# Patient Record
Sex: Female | Born: 1935 | Race: White | Hispanic: No | Marital: Single | Smoking: Never smoker
Health system: Southern US, Community
[De-identification: ages and names within clinical notes are randomized; demographics above are authoritative.]

## PROBLEM LIST (undated history)

## (undated) ENCOUNTER — Emergency Department (HOSPITAL_COMMUNITY): Discharge status: Absent without leave | Payer: Self-pay

## (undated) DIAGNOSIS — I10 Essential (primary) hypertension: Secondary | ICD-10-CM

## (undated) DIAGNOSIS — S0570XA Avulsion of unspecified eye, initial encounter: Secondary | ICD-10-CM

## (undated) DIAGNOSIS — I679 Cerebrovascular disease, unspecified: Secondary | ICD-10-CM

## (undated) DIAGNOSIS — Z9289 Personal history of other medical treatment: Secondary | ICD-10-CM

## (undated) DIAGNOSIS — R55 Syncope and collapse: Secondary | ICD-10-CM

## (undated) DIAGNOSIS — I440 Atrioventricular block, first degree: Secondary | ICD-10-CM

## (undated) DIAGNOSIS — M1990 Unspecified osteoarthritis, unspecified site: Secondary | ICD-10-CM

## (undated) DIAGNOSIS — K219 Gastro-esophageal reflux disease without esophagitis: Secondary | ICD-10-CM

## (undated) DIAGNOSIS — I4891 Unspecified atrial fibrillation: Secondary | ICD-10-CM

## (undated) DIAGNOSIS — E039 Hypothyroidism, unspecified: Secondary | ICD-10-CM

## (undated) DIAGNOSIS — R7303 Prediabetes: Secondary | ICD-10-CM

## (undated) DIAGNOSIS — J189 Pneumonia, unspecified organism: Secondary | ICD-10-CM

## (undated) HISTORY — DX: Hypothyroidism, unspecified: E03.9

## (undated) HISTORY — DX: Atrioventricular block, first degree: I44.0

## (undated) HISTORY — DX: Essential (primary) hypertension: I10

## (undated) HISTORY — DX: Personal history of other medical treatment: Z92.89

## (undated) HISTORY — PX: INGUINAL HERNIA REPAIR: SUR1180

## (undated) HISTORY — DX: Cerebrovascular disease, unspecified: I67.9

## (undated) HISTORY — PX: BACK SURGERY: SHX140

## (undated) HISTORY — DX: Unspecified atrial fibrillation: I48.91

## (undated) HISTORY — DX: Prediabetes: R73.03

## (undated) HISTORY — DX: Unspecified osteoarthritis, unspecified site: M19.90

## (undated) HISTORY — DX: Pneumonia, unspecified organism: J18.9

## (undated) HISTORY — DX: Syncope and collapse: R55

## (undated) HISTORY — DX: Avulsion of unspecified eye, initial encounter: S05.70XA

---

## 1997-10-17 ENCOUNTER — Other Ambulatory Visit: Admission: RE | Admit: 1997-10-17 | Discharge: 1997-10-17 | Payer: Self-pay | Admitting: *Deleted

## 1998-12-05 ENCOUNTER — Other Ambulatory Visit: Admission: RE | Admit: 1998-12-05 | Discharge: 1998-12-05 | Payer: Self-pay | Admitting: *Deleted

## 1999-11-29 ENCOUNTER — Other Ambulatory Visit: Admission: RE | Admit: 1999-11-29 | Discharge: 1999-11-29 | Payer: Self-pay | Admitting: Obstetrics and Gynecology

## 1999-12-02 ENCOUNTER — Encounter (INDEPENDENT_AMBULATORY_CARE_PROVIDER_SITE_OTHER): Payer: Self-pay

## 1999-12-02 ENCOUNTER — Other Ambulatory Visit: Admission: RE | Admit: 1999-12-02 | Discharge: 1999-12-02 | Payer: Self-pay | Admitting: Obstetrics and Gynecology

## 2000-01-08 ENCOUNTER — Encounter (INDEPENDENT_AMBULATORY_CARE_PROVIDER_SITE_OTHER): Payer: Self-pay

## 2000-01-08 ENCOUNTER — Ambulatory Visit (HOSPITAL_COMMUNITY): Admission: RE | Admit: 2000-01-08 | Discharge: 2000-01-08 | Payer: Self-pay | Admitting: Obstetrics and Gynecology

## 2000-08-05 ENCOUNTER — Emergency Department (HOSPITAL_COMMUNITY): Admission: EM | Admit: 2000-08-05 | Discharge: 2000-08-05 | Payer: Self-pay | Admitting: Emergency Medicine

## 2000-09-29 ENCOUNTER — Encounter: Payer: Self-pay | Admitting: Urology

## 2000-10-01 ENCOUNTER — Encounter (INDEPENDENT_AMBULATORY_CARE_PROVIDER_SITE_OTHER): Payer: Self-pay | Admitting: Specialist

## 2000-10-01 ENCOUNTER — Encounter: Payer: Self-pay | Admitting: Urology

## 2000-10-01 ENCOUNTER — Observation Stay (HOSPITAL_COMMUNITY): Admission: RE | Admit: 2000-10-01 | Discharge: 2000-10-02 | Payer: Self-pay | Admitting: Urology

## 2000-10-12 ENCOUNTER — Ambulatory Visit (HOSPITAL_COMMUNITY): Admission: RE | Admit: 2000-10-12 | Discharge: 2000-10-12 | Payer: Self-pay | Admitting: Gastroenterology

## 2000-10-12 ENCOUNTER — Encounter (INDEPENDENT_AMBULATORY_CARE_PROVIDER_SITE_OTHER): Payer: Self-pay | Admitting: Specialist

## 2001-01-29 ENCOUNTER — Inpatient Hospital Stay (HOSPITAL_COMMUNITY): Admission: RE | Admit: 2001-01-29 | Discharge: 2001-02-01 | Payer: Self-pay | Admitting: Specialist

## 2002-07-26 ENCOUNTER — Other Ambulatory Visit: Admission: RE | Admit: 2002-07-26 | Discharge: 2002-07-26 | Payer: Self-pay | Admitting: Obstetrics and Gynecology

## 2002-10-24 ENCOUNTER — Inpatient Hospital Stay (HOSPITAL_COMMUNITY): Admission: EM | Admit: 2002-10-24 | Discharge: 2002-10-27 | Payer: Self-pay

## 2002-10-25 ENCOUNTER — Encounter: Payer: Self-pay | Admitting: Cardiology

## 2004-02-06 ENCOUNTER — Ambulatory Visit (HOSPITAL_COMMUNITY): Admission: RE | Admit: 2004-02-06 | Discharge: 2004-02-06 | Payer: Self-pay | Admitting: Cardiology

## 2005-01-26 ENCOUNTER — Emergency Department (HOSPITAL_COMMUNITY): Admission: EM | Admit: 2005-01-26 | Discharge: 2005-01-26 | Payer: Self-pay | Admitting: Family Medicine

## 2005-02-03 ENCOUNTER — Encounter: Admission: RE | Admit: 2005-02-03 | Discharge: 2005-02-03 | Payer: Self-pay | Admitting: Specialist

## 2005-04-22 ENCOUNTER — Ambulatory Visit (HOSPITAL_COMMUNITY): Admission: RE | Admit: 2005-04-22 | Discharge: 2005-04-22 | Payer: Self-pay | Admitting: Cardiology

## 2005-10-17 ENCOUNTER — Encounter: Admission: RE | Admit: 2005-10-17 | Discharge: 2005-10-17 | Payer: Self-pay | Admitting: Neurosurgery

## 2006-03-24 ENCOUNTER — Emergency Department (HOSPITAL_COMMUNITY): Admission: EM | Admit: 2006-03-24 | Discharge: 2006-03-24 | Payer: Self-pay | Admitting: Emergency Medicine

## 2006-10-12 ENCOUNTER — Ambulatory Visit (HOSPITAL_COMMUNITY): Admission: RE | Admit: 2006-10-12 | Discharge: 2006-10-12 | Payer: Self-pay | Admitting: Neurosurgery

## 2006-11-19 ENCOUNTER — Inpatient Hospital Stay (HOSPITAL_COMMUNITY): Admission: RE | Admit: 2006-11-19 | Discharge: 2006-11-22 | Payer: Self-pay | Admitting: Neurosurgery

## 2007-09-23 ENCOUNTER — Encounter: Admission: RE | Admit: 2007-09-23 | Discharge: 2007-09-23 | Payer: Self-pay | Admitting: Orthopedic Surgery

## 2008-09-25 ENCOUNTER — Encounter: Admission: RE | Admit: 2008-09-25 | Discharge: 2008-09-25 | Payer: Self-pay | Admitting: Internal Medicine

## 2009-01-25 IMAGING — RF DG MYELOGRAM LUMBAR
12 series · 12 of 12 positions shown · non-contrast
Comparison: none

CLINICAL DATA: Left leg pain.  
LUMBAR MYELOGRAM:
Installation of contrast was performed by Dr. Utkucan at the L5-S1 level.  Prominent narrowing of the left aspect of the L4-5 disk space with osteophyte.  Mild impression upon the left L5 nerve at the L4-5 disk space level.  Ventral impression upon the thecal sac L2-3, L3-4, L4-5 and L5-S1 level.  No abnormal motion between flexion and extension.

[Series 1: run · 1 of 1 slices shown (1 of 12)]
[im 1/1]
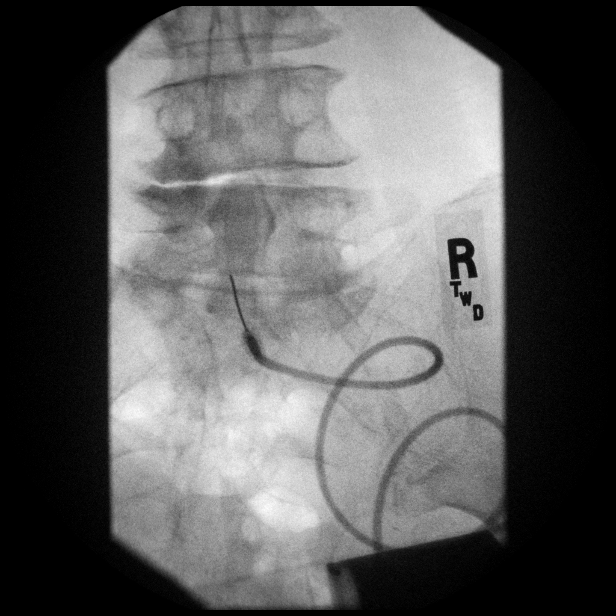

[Series 2: run · 1 of 1 slices shown (2 of 12)]
[im 1/1]
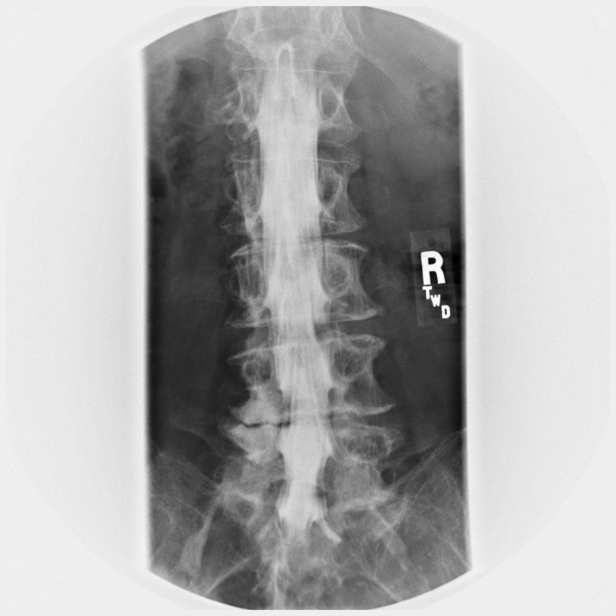

[Series 3: run · 1 of 1 slices shown (3 of 12)]
[im 1/1]
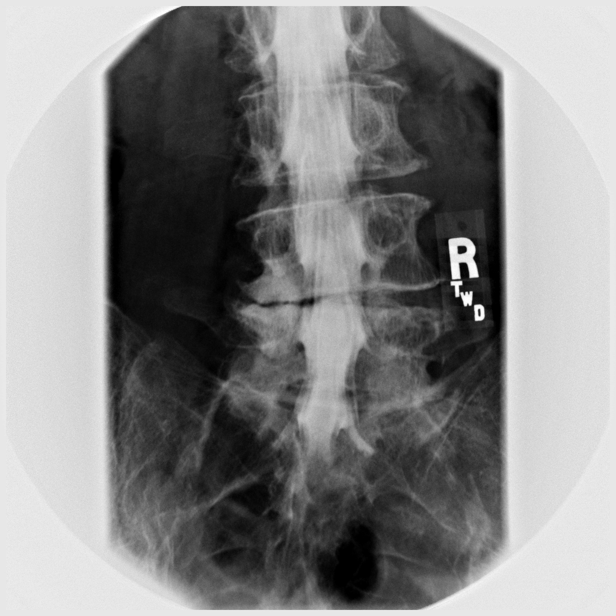

[Series 4: run · 1 of 1 slices shown (4 of 12)]
[im 1/1]
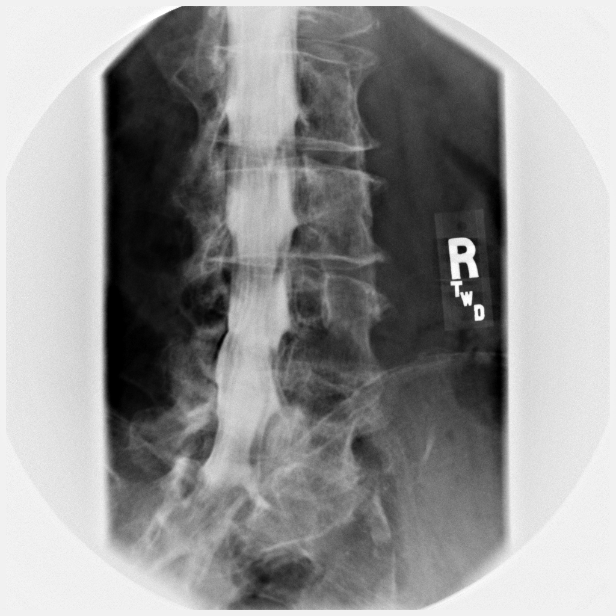

[Series 5: run · 1 of 1 slices shown (5 of 12)]
[im 1/1]
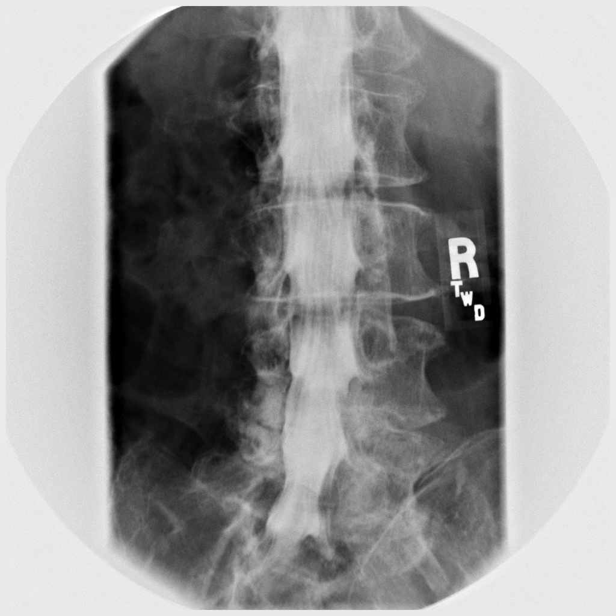

[Series 6: run · 1 of 1 slices shown (6 of 12)]
[im 1/1]
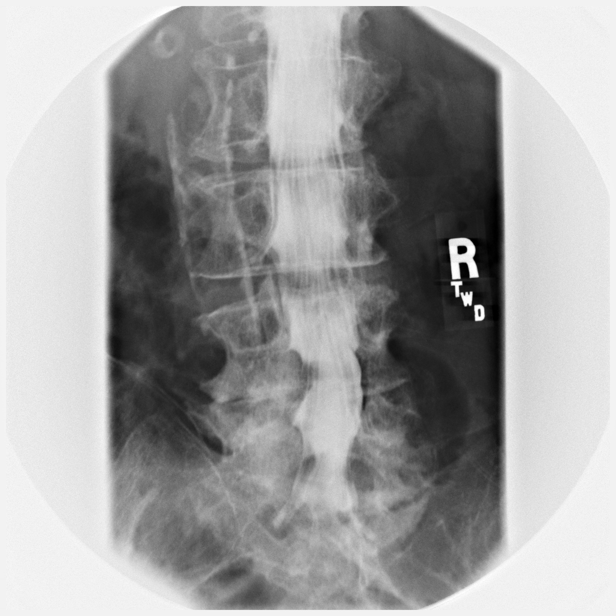

[Series 7: run · 1 of 1 slices shown (7 of 12)]
[im 1/1]
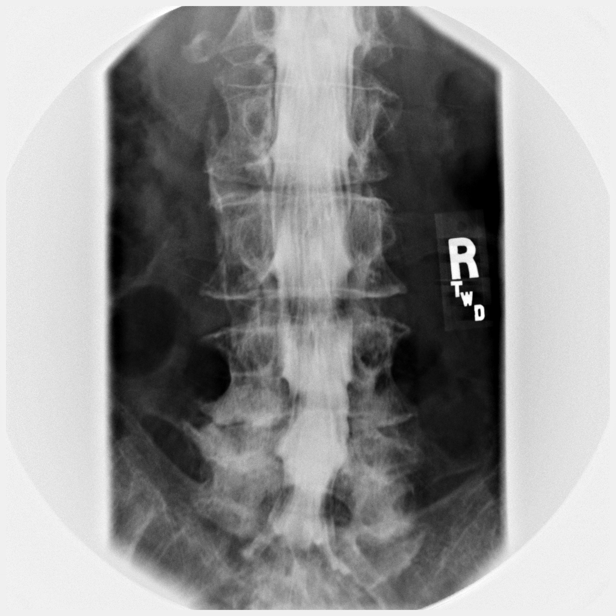

[Series 8: run · 1 of 1 slices shown (8 of 12)]
[im 1/1]
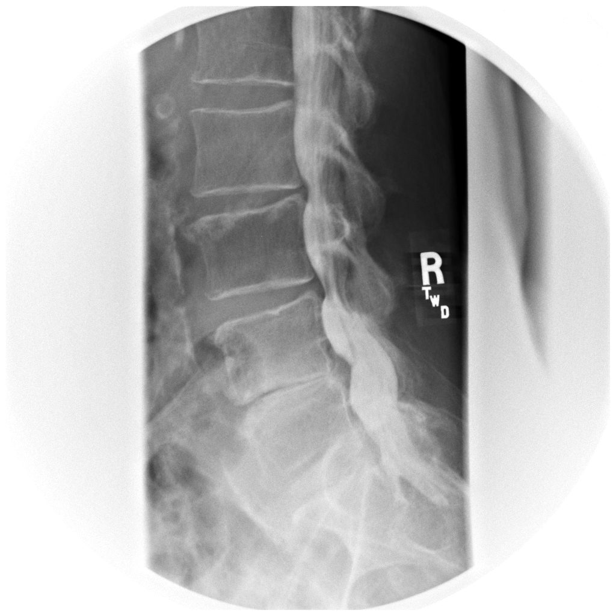

[Series 9: run · 1 of 1 slices shown (9 of 12)]
[im 1/1]
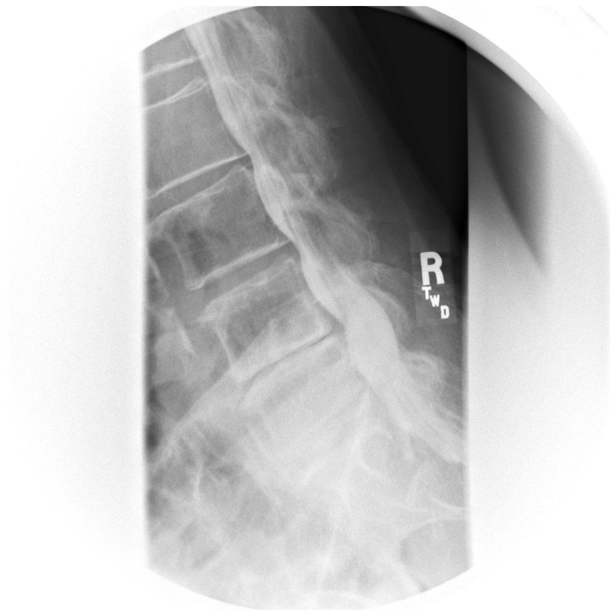

[Series 10: run · 1 of 1 slices shown (10 of 12)]
[im 1/1]
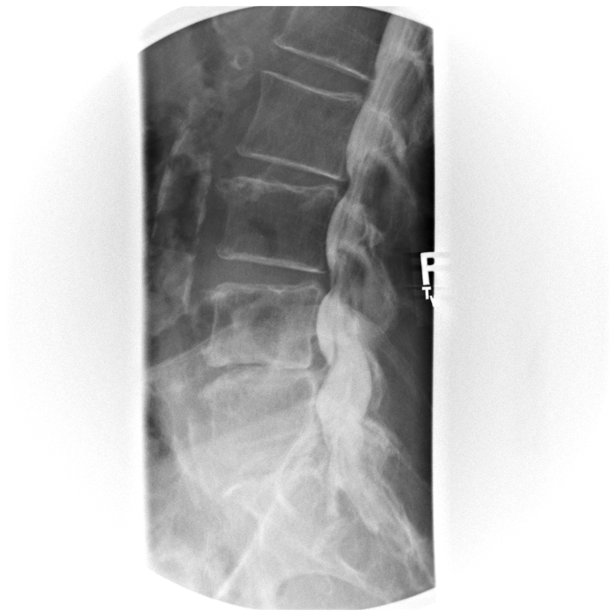

[Series 11: run · 1 of 1 slices shown (11 of 12)]
[im 1/1]
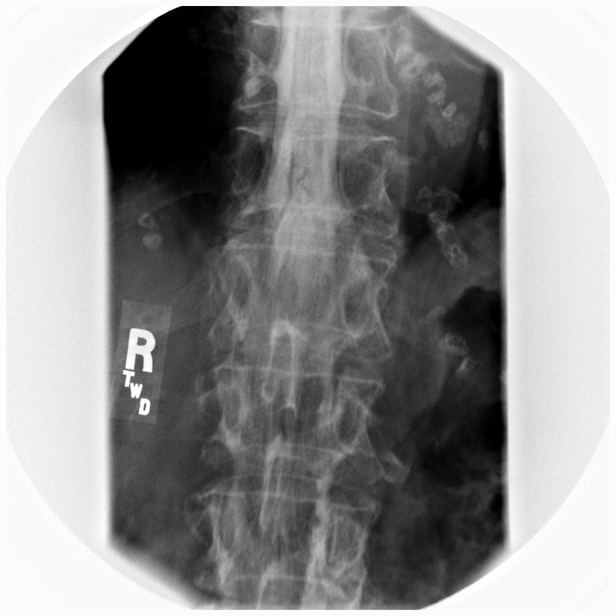

[Series 12: run · 1 of 1 slices shown (12 of 12)]
[im 1/1]
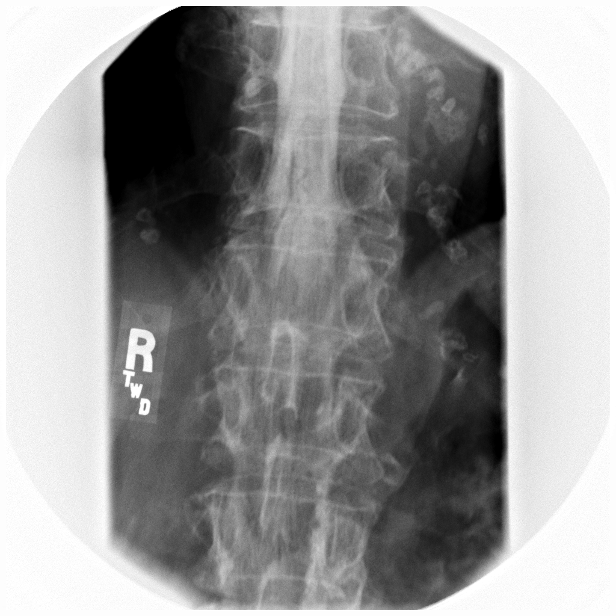

[12 of 12 positions shown; findings below may reference images not displayed]

IMPRESSION: Ventral impression upon the thecal sac L2-3 through L5-S1 with impression upon the left lateral aspect of the thecal sac at the L4-5 level.  Please see CT report.  
POST-MYELOGRAM CT OF THE LUMBAR SPINE:
The present examination incorporates from the mid-WRD to the mid-sacrum.  Aorta is calcified and mildly ectatic.  Conus located at the L1-2 level.  
T12-L1:  No significant spinal stenosis or foraminal narrowing.  
L1-2:  Mild bulge.  Mild spinal stenosis.  
L2-3:   Moderate bulge.  Ligamentum flavum hypertrophy.  Facet joint bony overgrowth.  Moderate spinal stenosis and mild bilateral foraminal narrowing. 
L3-4:  Moderate broad-based disk protrusion.  Ligamentum flavum hypertrophy.  Facet joint degenerative changes.  Moderate to marked spinal stenosis.  Mild to slightly moderate bilateral foraminal narrowing. 
L4-5:  Moderate to large size broad-based disk protrusion having greatest extension left posterolateral/foraminal position with moderate left lateral recess stenosis and indentation upon the left lateral aspect of the thecal sac.  Marked left foraminal narrowing and compression of the exiting left L4 nerve root.  Prior laminectomy.
L5-S1:   Moderate broad-based disk protrusion.   The component extending into the neural foramen is causing left greater than right foraminal narrowing and encroachment upon the course of the exiting L5 nerve roots, left greater than right.  Additionally, there is a small left paracentral focal component of protruding disk material indenting the ventral aspect of the thecal sac.  Bilateral lateral recess stenosis.  Prior laminectomy.
IMPRESSION: The most significant left-sided findings on the present examination occur at the L4-5 followed by the L5-S1 level as described above.

## 2009-08-28 ENCOUNTER — Ambulatory Visit (HOSPITAL_COMMUNITY): Admission: RE | Admit: 2009-08-28 | Discharge: 2009-08-28 | Payer: Self-pay | Admitting: Internal Medicine

## 2010-01-24 DIAGNOSIS — J189 Pneumonia, unspecified organism: Secondary | ICD-10-CM

## 2010-01-24 DIAGNOSIS — Z9289 Personal history of other medical treatment: Secondary | ICD-10-CM

## 2010-01-24 HISTORY — DX: Personal history of other medical treatment: Z92.89

## 2010-01-24 HISTORY — DX: Pneumonia, unspecified organism: J18.9

## 2010-02-03 ENCOUNTER — Encounter: Payer: Self-pay | Admitting: Emergency Medicine

## 2010-02-03 ENCOUNTER — Ambulatory Visit: Payer: Self-pay | Admitting: Diagnostic Radiology

## 2010-02-03 ENCOUNTER — Inpatient Hospital Stay (HOSPITAL_COMMUNITY): Admission: RE | Admit: 2010-02-03 | Discharge: 2010-02-08 | Payer: Self-pay | Admitting: Internal Medicine

## 2010-02-15 ENCOUNTER — Ambulatory Visit (HOSPITAL_COMMUNITY): Admission: RE | Admit: 2010-02-15 | Discharge: 2010-02-15 | Payer: Self-pay | Admitting: Cardiology

## 2010-02-15 ENCOUNTER — Ambulatory Visit: Payer: Self-pay | Admitting: Cardiology

## 2010-02-15 ENCOUNTER — Encounter: Admission: RE | Admit: 2010-02-15 | Discharge: 2010-02-15 | Payer: Self-pay | Admitting: Cardiology

## 2010-02-20 ENCOUNTER — Ambulatory Visit (HOSPITAL_COMMUNITY): Admission: RE | Admit: 2010-02-20 | Discharge: 2010-02-20 | Payer: Self-pay | Admitting: Cardiology

## 2010-02-22 ENCOUNTER — Ambulatory Visit: Payer: Self-pay | Admitting: Cardiology

## 2010-03-04 ENCOUNTER — Encounter: Admission: RE | Admit: 2010-03-04 | Discharge: 2010-03-04 | Payer: Self-pay | Admitting: Cardiology

## 2010-03-08 ENCOUNTER — Ambulatory Visit: Payer: Self-pay | Admitting: Cardiology

## 2010-06-12 ENCOUNTER — Ambulatory Visit: Payer: Self-pay | Admitting: Cardiology

## 2010-07-01 ENCOUNTER — Other Ambulatory Visit: Payer: Self-pay | Admitting: Gastroenterology

## 2010-07-01 ENCOUNTER — Ambulatory Visit
Admission: RE | Admit: 2010-07-01 | Discharge: 2010-07-01 | Disposition: A | Discharge status: Home / Self Care | Payer: MEDICARE | Source: Ambulatory Visit | Attending: Gastroenterology | Admitting: Gastroenterology

## 2010-08-08 LAB — BASIC METABOLIC PANEL
BUN: 19 mg/dL (ref 6–23)
BUN: 21 mg/dL (ref 6–23)
CO2: 23 mEq/L (ref 19–32)
CO2: 24 mEq/L (ref 19–32)
CO2: 25 mEq/L (ref 19–32)
Calcium: 8.5 mg/dL (ref 8.4–10.5)
Calcium: 8.8 mg/dL (ref 8.4–10.5)
Calcium: 8.6 mg/dL (ref 8.4–10.5)
Chloride: 100 mEq/L (ref 96–112)
Chloride: 101 mEq/L (ref 96–112)
Chloride: 102 mEq/L (ref 96–112)
Chloride: 102 mEq/L (ref 96–112)
Chloride: 102 mEq/L (ref 96–112)
GFR calc non Af Amer: 47 mL/min — ABNORMAL LOW (ref >60)
Glucose, Bld: 90 mg/dL (ref 70–99)
Glucose, Bld: 94 mg/dL (ref 70–99)
Glucose, Bld: 108 mg/dL — ABNORMAL HIGH (ref 70–99)
Potassium: 4.4 mEq/L (ref 3.5–5.1)
Potassium: 4.5 mEq/L (ref 3.5–5.1)
Potassium: 4.1 mEq/L (ref 3.5–5.1)
Potassium: 4.2 mEq/L (ref 3.5–5.1)
Potassium: 4.6 mEq/L (ref 3.5–5.1)
Sodium: 133 mEq/L — ABNORMAL LOW (ref 135–145)

## 2010-08-08 LAB — URINALYSIS, ROUTINE W REFLEX MICROSCOPIC
Bilirubin Urine: NEGATIVE
Hgb urine dipstick: NEGATIVE
Protein, ur: NEGATIVE mg/dL
Urobilinogen, UA: 0.2 mg/dL (ref 0.0–1.0)

## 2010-08-08 LAB — CULTURE, BLOOD (ROUTINE X 2): Culture: NO GROWTH

## 2010-08-08 LAB — CBC
HCT: 28 % — ABNORMAL LOW (ref 36.0–46.0)
HCT: 28.8 % — ABNORMAL LOW (ref 36.0–46.0)
HCT: 29.4 % — ABNORMAL LOW (ref 36.0–46.0)
HCT: 32.7 % — ABNORMAL LOW (ref 36.0–46.0)
Hemoglobin: 9.3 g/dL — ABNORMAL LOW (ref 12.0–15.0)
Hemoglobin: 9.3 g/dL — ABNORMAL LOW (ref 12.0–15.0)
Hemoglobin: 11.2 g/dL — ABNORMAL LOW (ref 12.0–15.0)
MCH: 30.6 pg (ref 26.0–34.0)
MCH: 31 pg (ref 26.0–34.0)
MCH: 30.7 pg (ref 26.0–34.0)
MCHC: 32.7 g/dL (ref 30.0–36.0)
MCHC: 32.3 g/dL (ref 30.0–36.0)
MCHC: 32.9 g/dL (ref 30.0–36.0)
MCHC: 34.2 g/dL (ref 30.0–36.0)
MCV: 95 fL (ref 78.0–100.0)
MCV: 95.5 fL (ref 78.0–100.0)
MCV: 95.1 fL (ref 78.0–100.0)
Platelets: 283 10*3/uL (ref 150–400)
Platelets: 231 10*3/uL (ref 150–400)
RBC: 2.97 MIL/uL — ABNORMAL LOW (ref 3.87–5.11)
RBC: 3.04 MIL/uL — ABNORMAL LOW (ref 3.87–5.11)
RDW: 12.9 % (ref 11.5–15.5)
RDW: 13 % (ref 11.5–15.5)
RDW: 12.3 % (ref 11.5–15.5)
WBC: 10.4 10*3/uL (ref 4.0–10.5)
WBC: 10.5 10*3/uL (ref 4.0–10.5)

## 2010-08-08 LAB — CARDIAC PANEL(CRET KIN+CKTOT+MB+TROPI)
Relative Index: 1.3 (ref 0.0–2.5)
Relative Index: 1.3 (ref 0.0–2.5)
Total CK: 133 U/L (ref 7–177)
Troponin I: 0.01 ng/mL (ref 0.00–0.06)

## 2010-08-08 LAB — DIFFERENTIAL
Basophils Absolute: 0.1 10*3/uL (ref 0.0–0.1)
Basophils Absolute: 0.1 10*3/uL (ref 0.0–0.1)
Basophils Relative: 1 % (ref 0–1)
Eosinophils Relative: 4 % (ref 0–5)
Eosinophils Relative: 3 % (ref 0–5)
Lymphocytes Relative: 19 % (ref 12–46)
Lymphocytes Relative: 19 % (ref 12–46)
Monocytes Absolute: 2.1 10*3/uL — ABNORMAL HIGH (ref 0.1–1.0)
Monocytes Absolute: 1.6 10*3/uL — ABNORMAL HIGH (ref 0.1–1.0)
Monocytes Relative: 16 % — ABNORMAL HIGH (ref 3–12)

## 2010-08-08 LAB — POCT CARDIAC MARKERS: Myoglobin, poc: 120 ng/mL (ref 12–200)

## 2010-08-08 LAB — PROTIME-INR
INR: 1.2 (ref 0.00–1.49)
Prothrombin Time: 27.9 seconds — ABNORMAL HIGH (ref 11.6–15.2)
Prothrombin Time: 15.4 seconds — ABNORMAL HIGH (ref 11.6–15.2)

## 2010-08-08 LAB — TSH: TSH: 1.024 u[IU]/mL (ref 0.350–4.500)

## 2010-10-02 ENCOUNTER — Encounter: Payer: Self-pay | Admitting: Nurse Practitioner

## 2010-10-02 DIAGNOSIS — M1990 Unspecified osteoarthritis, unspecified site: Secondary | ICD-10-CM | POA: Insufficient documentation

## 2010-10-02 DIAGNOSIS — I10 Essential (primary) hypertension: Secondary | ICD-10-CM | POA: Insufficient documentation

## 2010-10-02 DIAGNOSIS — I4891 Unspecified atrial fibrillation: Secondary | ICD-10-CM | POA: Insufficient documentation

## 2010-10-02 DIAGNOSIS — I679 Cerebrovascular disease, unspecified: Secondary | ICD-10-CM | POA: Insufficient documentation

## 2010-10-02 DIAGNOSIS — I440 Atrioventricular block, first degree: Secondary | ICD-10-CM | POA: Insufficient documentation

## 2010-10-02 DIAGNOSIS — E039 Hypothyroidism, unspecified: Secondary | ICD-10-CM | POA: Insufficient documentation

## 2010-10-04 ENCOUNTER — Encounter: Payer: Self-pay | Admitting: Nurse Practitioner

## 2010-10-04 ENCOUNTER — Ambulatory Visit (INDEPENDENT_AMBULATORY_CARE_PROVIDER_SITE_OTHER): Payer: MEDICARE | Admitting: Nurse Practitioner

## 2010-10-04 VITALS — BP 110/60 | HR 76 | Wt 107.0 lb

## 2010-10-04 DIAGNOSIS — I10 Essential (primary) hypertension: Secondary | ICD-10-CM

## 2010-10-04 DIAGNOSIS — R55 Syncope and collapse: Secondary | ICD-10-CM

## 2010-10-04 NOTE — Assessment & Plan Note (Signed)
No recurrence. 

## 2010-10-04 NOTE — Progress Notes (Signed)
   [name] Date of Birth: December 20, 1914   History of Present Illness: [name] is seen today for a work in visit. She is seen for Dr. Deborah Chalk. She is here with her family. They note that some of her blood pressures have been higher earlier in the month. It is now back down. She is 78 and doing remarkably well. She is not having chest pain. Her shortness of breath is at her baseline. She has taken an extra 3/4 of atenolol once or twice.   Current Outpatient Prescriptions on File Prior to Visit  Medication Sig Dispense Refill  . aspirin 81 MG tablet Take 81 mg by mouth daily.        Marland Kitchen atenolol (TENORMIN) 25 MG tablet Take 12.5 mg by mouth daily.        . cholecalciferol (VITAMIN D) 1000 UNITS tablet Take 1,000 Units by mouth daily.        . furosemide (LASIX) 40 MG tablet Take 20 mg by mouth daily.        Marland Kitchen levothyroxine (SYNTHROID, LEVOTHROID) 75 MCG tablet Take 75 mcg by mouth daily.        . Calcium Carbonate (CALTRATE 600 PO) Take by mouth 2 (two) times daily.        . Multiple Vitamins-Minerals (OCUVITE PRESERVISION PO) Take by mouth 2 (two) times daily.          Allergies  Allergen Reactions  . Ciprocin-Fluocin-Procin (Fluocinolone Acetonide)   . Codeine   . Sulfa Drugs Cross Reactors     Past Medical History  Diagnosis Date  . Syncope and collapse     Negative loop recorder  . Hypertension   . AV block, 1st degree   . CVD (cerebrovascular disease)   . Osteoarthritis   . Hypothyroidism   . Hypokalemia   . Diarrhea   . Atrial fibrillation   . Pneumonia 09/11  . Traumatic enucleation of eye     left eye  . History of echocardiogram 9/11    normal EF, mild to mod MR & TR, mod pulmonary HTN    Past Surgical History  Procedure Date  . Back surgery   . Inguinal hernia repair     left sided    History  Smoking status  . Never Smoker   Smokeless tobacco  . Not on file    History  Alcohol Use No    Family History  Problem Relation Age of Onset  . Heart  attack Father   . Heart attack Brother   . Cancer Brother   . Stroke Brother     Review of Systems: The review of systems is positive for chronic diarrhea.  All other systems were reviewed and are negative.  Physical Exam: BP 110/60  Pulse 76  Wt 107 lb (48.535 kg) Patient is very pleasant and in no acute distress. She looks younger than her stated age. She is thin. Skin is warm and dry. Color is normal.  HEENT is unremarkable. Normocephalic/atraumatic. PERRL. Sclera are nonicteric. Neck is supple. No masses. No JVD. Lungs are clear. Cardiac exam shows a regular rate and rhythm today. She appears to be in sinus. Abdomen is soft. Extremities are without edema. Gait and ROM are intact. No gross neurologic deficits noted.  LABORATORY DATA:   Assessment / Plan:

## 2010-10-04 NOTE — Patient Instructions (Signed)
Take an extra half of atenolol if you have 2 days of blood pressure above 160/ Let me know if you are having to take a lot of extra tablets and we will readjust her medicines.

## 2010-10-04 NOTE — Assessment & Plan Note (Signed)
Blood pressure is better today. She will take an extra half of atenolol if her blood pressure is above 160 for 2 days in a row. She is to let us know if she is having to take several. If so, we will then adjust her medicines. I will see her back in July. Patient is agreeable to this plan and will call if any problems develop in the interim.

## 2010-10-08 NOTE — Op Note (Signed)
[name], [name]                ACCOUNT NO.:  [id number]   MEDICAL RECORD NO.:  [id number]          PATIENT TYPE:  INP   LOCATION:  3037                         FACILITY:  [facility]   PHYSICIAN:  Cristi Loron, M.D.DATE OF BIRTH:  1915-03-03   DATE OF PROCEDURE:  11/19/2006  DATE OF DISCHARGE:                               OPERATIVE REPORT   BRIEF HISTORY:  The patient is an 77 year old white female who suffered  from back and leg pain consistent with neurogenic claudication.  She was  worked up with lumbar MRI and a myelo CT which demonstrated she had  stenosis at L2-3, L3-4, and L4-5.  The patient has had prior surgery by  another physician many years ago.  I discussed the various treatment  options with the patient including surgery.  The patient has weighed the  risks, benefits, and alternatives to surgery and decided to proceed with  a decompressive laminectomy.   PREOPERATIVE DIAGNOSIS:  L2-3, L3-4, and L4-5 spinal stenosis.   POSTOPERATIVE DIAGNOSIS:  L2-3, L3-4, and L4-5 spinal stenosis.   PROCEDURE:  L2, L3, and L4 laminectomy to decompress the L2-3, L3-4, and  L4-5 spaces, i.e., the L3, L4, and L5 nerve roots using microdissection.   SURGEON:  Cristi Loron, M.D.   ASSISTANT:  Coletta Memos, M.D.   ANESTHESIA:  General endotracheal.   ESTIMATED BLOOD LOSS:  100 cc.   SPECIMENS:  None.   DRAINS:  None.   COMPLICATIONS:  None.   DESCRIPTION OF PROCEDURE:  The patient was brought to the operating room  by the anesthesia team.  General endotracheal anesthesia was induced.  The patient was turned to the prone position on the Wilson frame.  Her  lumbosacral region was then prepared with Betadine scrub and Betadine  solution.  Sterile drapes were applied.  I then injected the area to be  incised with Marcaine with epinephrine solution and used a scalpel to  make a linear midline incision through the patient's previous surgical  scar.  I used electrocautery  to perform a subperiosteal dissection,  exposing the spinous processes and lamina of L3 and L2 (the lamina of L4  had previously been removed).  We then obtained intraoperative  radiograph to confirm our location.  We the inserted the cerebellar  retractor for exposure.  I began by removing the spinous process of L3  with a Leksell rongeur as well as the caudal edge of the L2 spinous  process using the Leksell rongeur.  I then used a high-speed drill to  perform laminotomies bilaterally at L3 and L2.  I then widened these  laminotomies with the Kerrison punch, removing the ligament flavum at L2-  3,  completing the laminectomy at L3, and removed the ligamentum flavum  at L3-4, and we carefully dissected through the epidural scar tissue at  L4-5, removing the epidural fibrosis.  We brought the operative  microscope into the field, and under its magnification and illumination  we completed the microdissection/decompression.  We then used a Kerrison  punch to remove some redundant ligamentum from the lateral recesses and  performed  foraminotomies about the bilateral L3, L4, and L5 nerve roots.  We then inspected the intervertebral discs at L2-3, L3-4, and L4-5, and  there were no disc ruptures.  We then palpated along the ventral surface  of the thecal sac and along the exit route of the bilateral L3, L4, and  L5 nerve roots and noted the neural structure were well decompressed  bilaterally.  We then obtained hemostasis using bipolar electrocautery.  We irrigated the wound out with bacitracin solution, removed the  retractors, and then reapproximated the patient's thoracolumbar fascia  with interrupted #1 Vicryl suture, the subcutaneous tissue with  interrupted 2-0 Vicryl suture, and the skin with Steri-Strips and  Benzoin.  The wound was then coated with bacitracin ointment, and a  sterile dressing was applied.  The drapes were then removed, and the  patient was subsequently turned to the  supine position, where she was  extubated by the anesthesia team and transported to the postanesthesia  care unit in stable condition.  All sponge, instrument, and needle  counts were correct at the end of this case.      Cristi Loron, M.D.  Electronically Signed     JDJ/MEDQ  D:  11/19/2006  T:  11/20/2006  Job:  [id number]

## 2010-10-11 NOTE — Discharge Summary (Signed)
[facility]  Patient:    [name], [name]                       MRN: [id number] Adm. Date:  [id number] Disc. Date: [id number] Attending:  Monica Becton CC:         [name], M.D.   Discharge Summary  HISTORY OF PRESENT ILLNESS:  This is an 77 year old lady with a several-week history of painless gross hematuria.  Subsequent office cystoscopy confirmed a papillary bladder tumor in the midline up along the posterior wall.  Her preoperative IVP was otherwise normal.  She now comes in for a TUR resection of the tumor and further staging.  Also noted on the preoperative workup was a faint left upper lobe mass-like density measuring about 12 x 17 mm.  CT scan was recommended.  Subsequent CT scan of the chest prior to discharge showed a single 6 mm nonspecific right upper lobe nodule with some bibasilar atelectasis but no evidence of a left upper lobe nodule.  Recommended was a follow-up thin-sectioned computed CT in three months.  The pathology report is still pending.  Pertinent laboratory data: Electrolytes were normal with a BUN of 12, a creatinine of 0.8.  INR was 1.2, hemoglobin 12.3, hematocrit 36.4.  Urine culture was still pending.  HOSPITAL COURSE:  The patient came in early in the morning on Oct 01, 2000, underwent a TUR of the bladder tumor without incident.  Postoperatively, she was observed overnight with a Foley catheter which was removed the following morning.  She was then sent on home later on that day when she was voiding satisfactorily.  I went over the results of her CAT scan with her and we will relay these results to Dr. Heather Frazier, the primary care physician.  FINAL DIAGNOSES: 1. 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 2. History of a left upper lobe density (not confirmed by CT scan).  However,    there was a 6 mm nonspecific right upper lobe nodule.  OPERATION:  Cystoscopy and transurethral resection of  bladder tumor.  COMPLICATIONS:  None.  CONDITION ON DISCHARGE:  Improving.  DISCHARGE MEDICATIONS: 1. ______ trimethoprim DS one b.i.d., #10. 2. Pyridium 200 mg, #20, p.r.n. burning.  DISPOSITION:  Regular diet, force fluids, limited activity.  No aspirin.  To see me in the office in two weeks for follow-up. DD:  10/02/00 TD:  10/04/00 Job: [id number]

## 2010-10-11 NOTE — H&P (Signed)
NAMEBRYLAN, [name]                ACCOUNT NO.:  [id number]   MEDICAL RECORD NO.:  [id number]           PATIENT TYPE:   LOCATION:                                 FACILITY:   PHYSICIAN:  Colleen Can. Deborah Chalk, M.D.    DATE OF BIRTH:   DATE OF ADMISSION:  04/22/2005  DATE OF DISCHARGE:                                HISTORY & PHYSICAL   CHIEF COMPLAINT:  None.   HISTORY OF PRESENT ILLNESS:  [name] is a very pleasant 77 year old  female.  She has had a loop recorder implanted approximately 14 months ago  for recurrent episodes of syncope.  Her device has been interrogated and has  shown no episodes to suggest an etiology for syncope.  In general, she has  remained stable.  She has had no recurrence of atrial fibrillation.  She now  presents for elective loop recorder removal.   PAST MEDICAL HISTORY:  1.  History of syncope last occurring in August of 2005.  2.  Prior history of atrial fibrillation currently in sinus rhythm.  3.  Chronic first degree AV block.  4.  Prior history of anemia.  5.  Hypertension.  6.  Cerebrovascular disease, she is maintained on chronic Plavix.  7.  Osteoarthritis.  8.  Hypothyroidism.  9.  History of hypokalemia.   ALLERGIES:  CODEINE, SULFA, CIPRO.   CURRENT MEDICATIONS:  1.  Hyzaar 50/12.5 1/2 tablet alternating with whole tablet daily.  2.  Potassium 10 mEq daily.  3.  Synthroid 75 mcg daily.  4.  Baby aspirin daily.  5.  Fosamax weekly.  6.  Ocuvite daily.  7.  Caltrate two times a day.   It should be noted that her Plavix has been discontinued prior to her last  visit in our office.   FAMILY HISTORY:  Noncontributory.   SOCIAL HISTORY:  She is a retired Company secretary.  She lives at home.  She  has multiple family members for support.  She has two children.  She has no  alcohol use.  She does use snuff as a tobacco product.   PAST SURGICAL HISTORY:  Back surgery, bladder surgery, inguinal hernia  repair on the left, and previous  left eye enucleation.   REVIEW OF SYSTEMS:  Basically as noted above.  She has had no recent fever,  chills, or flu-like symptoms.  No cough, no shortness of breath, no chest  pain, no abdominal pain, constipation or diarrhea.  No problems with  urination.  She has had no recurrent syncope.   PHYSICAL EXAMINATION:  GENERAL:  She is a very pleasant elderly female who  appears younger than her stated age.  VITAL SIGNS:  Blood pressure 160/80 sitting, 160/90 standing, heart rate 80  and regular, respirations 18.  She is afebrile.  HEENT:  Unremarkable.  NECK:  Supple.  LUNGS:  Clear.  Loop recorder in the left anterior chest.  ABDOMEN:  Soft with positive bowel sounds and nontender.  EXTREMITIES:  Without edema.  NEUROLOGY:  Intact.  There are no gross focal deficits.   LABORATORY DATA:  Pending.  IMPRESSION:  1.  Remote history of syncope.  2.  Functioning loop recorder with no pathology noted.  3.  Hypertension.   PLAN:  We will proceed on with removal of the loop recorder.  The procedure  has been reviewed with both her and her daughter and she is willing to  proceed on April 22, 2005.      Sharlee Blew, N.P.      Colleen Can. Deborah Chalk, M.D.  Electronically Signed    LC/MEDQ  D:  04/16/2005  T:  04/16/2005  Job:  [id number]

## 2010-10-11 NOTE — Procedures (Signed)
[place]  Patient:    ZABELLA, WEASE                       MRN: [id number] Proc. Date: 10/12/00 Adm. Date:  [id number] Attending:  Rich Brave CC:         Heather Roberts, M.D.  Claudette Laws, M.D.   Procedure Report  PROCEDURE:  Upper endoscopY with biopsies.  ENDOSCOPIST:  Florencia Reasons, M.D.  INDICATIONS:  A 77 year old female with unexplained iron-deficiency anemia and hemoccult-negative stool.  FINDINGS:  Normal exam.  DESCRIPTION OF PROCEDURE:  The nature, purpose, and risks of the procedure had been reviewed with the patient who provided written consent.  Sedation was fentanyl 30 mcg and Versed 2 mg IV without arrhythmias or desaturation.  The Olympus small-caliber adult video endoscope was passed easily under direct vision.  The vocal cords looked normal.  The esophagus was readily entered.  The esophagus was endoscopically normal.  There was no evidence of Barretts esophagus, reflux esophagitis, varices, infection, or neoplasia.  A minimal 1 cm hiatal hernia was present.  The stomach was entered.  It contained a small bilious residual but had normal mucosa without evidence of gastritis, erosions, ulcers, polyps, or masses including a retroflexed view of the proximal stomach.  The pylorus, duodenal bulb, and second duodenum looked normal.  In particular, the villous mucosal pattern of the duodenum was normal.   Several duodenal biopsies were obtained prior to removal of the scope, however, to help exclude celiac disease which could account for iron-deficiency anemia in the setting of hemoccult-negative stool.  The scope was then removed from the patient who tolerated this procedure well, and there were no apparent complications.  IMPRESSION:  Essentially normal upper endoscopy.  PLAN:  Await pathology on biopsy and proceed to colonoscopic evaluation. DD:  10/12/00 TD:  10/12/00 Job: [id number]

## 2010-10-11 NOTE — Op Note (Signed)
[facility]  Patient:    [name], [name] [facility] Visit Number: [id number] MRN: [id number]          Service Type: SUR Location: 4W 0451 01 Attending Physician:  Pierce Crane Proc. Date: 01/29/01 Admit Date:  01/29/2001                             Operative Report  PREOPERATIVE DIAGNOSIS:  Spinal stenosis L4-5.  POSTOPERATIVE DIAGNOSIS:  Spinal stenosis L4-5.  PROCEDURE:  Central lumbar decompression, L4 laminectomy, partial laminectomy L5, foraminotomies L4 and L5.  ANESTHESIA:  General.  ASSISTANT:  Georges Lynch. Gioffre, M.D.  BRIEF HISTORY AND INDICATIONS:  An 77 year old with fractured left lower extremity radiculopathy secondary to the L4 and L5 nerve root compression. Noted, is severe spinal stenosis.  Operative intervention was indicated for decompression.  Risks and benefits discussed including bleeding, infection, damage to vascular structures, CSF leakage, epidural fibrosis, need for a repeat decompression or fusion in the future.  DESCRIPTION OF PROCEDURE:  The patient is in supine position.  After the induction of adequate general anesthesia, 1 gram of Kefzol, the patient placed prone on the [place] frame.  All bony prominences well padded.  Lumbar region is prepped and draped in the usual sterile fashion.  Incision was made at the spinous process of 3 to S1.  Subcutaneous tissue was dissected. Electrocautery was utilized to achieve hemostasis.  Dorsolumbar fascia was divided in line with the midline.  Paraspinous muscles were elevated from the lamina of 4 and 5 bilaterally, and the McCullough retractor was placed. Confirmatory radiograph obtained and confirmed the L4 and L3 and 5 spinous processes.  A Leksell rongeur was utilized to remove the 4 spinous process and partial of 5.  Next, hypertrophic ligamentum flavum was noted centrally and laterally.  The 2 mm Kerrison was utilized to skeletonize the ligamentum flavum.  It was  then removed, protecting the neural elements at all times. Severe lateral recess stenosis was noted at 4-5 on the left as well as in the foramen of 4 and of 5.  Neural elements were well protected, and foraminotomies of 4 and 5 were performed, decompressing both nerve roots. Bipolar electrocautery was utilized to achieve hemostasis.  The foramen of 4 and 5 were patent on the right side.  Following the decompression, there was good restoration of the thecal sac.  The nerve roots were free and mobile. Hockey-stick probe placed in the foramen of 4 and 5 and found to be widely patent.  The wound copiously irrigated.  Disk was inspected and found to be protruding, but it was a hard disk and therefore not amenable to diskectomy. The wound again copiously irrigated.  Inspection revealed no evidence of CSF leakage or active bleeding.  The dorsolumbar fascia was reapproximated with #1 Vicryl interrupted figure-of-eight sutures, subcutaneous tissue reapproximated with 2-0 Vicryl simple sutures.  Skin reapproximated with staples.  The wound was dressed sterilely.  The patient was placed supine on the hospital bed and extubated without difficulty and transported to the recovery room in satisfactory condition.  The patient tolerated the procedure well with no complications.  Blood loss was 20 cc. Attending Physician:  Pierce Crane DD:  01/29/01 TD:  01/29/01 Job: [id number]

## 2010-10-11 NOTE — Discharge Summary (Signed)
NAMETARAH, BUBOLTZ                ACCOUNT NO.:  [id number]   MEDICAL RECORD NO.:  [id number]          PATIENT TYPE:  INP   LOCATION:  3037                         FACILITY:  [facility]   PHYSICIAN:  Cristi Loron, M.D.DATE OF BIRTH:  Aug 05, 1914   DATE OF ADMISSION:  11/19/2006  DATE OF DISCHARGE:  11/22/2006                               DISCHARGE SUMMARY   BRIEF HISTORY:  The patient is an 77 year old white female who suffers  from back and leg pain consistent with neurogenic claudication.  She was  worked up with a lumbar MRI and lumbar myelo-CT which demonstrated she  had stenosis at L2-3, 3-4, 4-5.  The patient has undergone prior surgery  by another physician many years ago.  I discussed the various treatment  options with the patient including surgery, the patient has weighed the  risks, benefits, and alternatives of surgery and has decided to proceed  with a decompressive laminectomy.   For further details of this admission, please refer to the entire  history and physical.   HOSPITAL COURSE:  I admitted the patient to [facility] on November 19, 2006.  On the day of admission, performed a L2-3-4 laminectomy.  The  surgery went well.  For full details of this operation, please refer to  the operative note.   POSTOPERATIVE COURSE:  The patient's postoperative course was  unremarkable.  The patient did note that her primary doctor told her she  had a urinary tract infection noted on a routine urinalysis.  Because of  this, we elected to treat her empirically with antibiotics.  By postop  #3 on November 22, 2006, the patient was requesting discharge home.  She was  discharged home on November 22, 2006.   DISCHARGE INSTRUCTIONS:  The patient was given written discharge  instructions.  Instructed to follow up with me in 4 weeks.   DISCHARGE PRESCRIPTIONS:  1. Lortab 10 #50 one p.o. every four hours p.r.n. for pain.  2. Cipro 500 mg #10 one p.o. b.i.d.   FINAL DIAGNOSIS:   L2-3, 3-4, 4-5 spinal stenosis.   PROCEDURE PERFORMED:  L2-3 and 4 laminectomy to decompress the L2-3, L3-  4, and L4-5 spaces, and the bilateral L3-4-5 nerve roots using  microdissection.      Cristi Loron, M.D.  Electronically Signed     JDJ/MEDQ  D:  12/23/2006  T:  12/24/2006  Job:  [id number]

## 2010-10-11 NOTE — Procedures (Signed)
[place]  Patient:    [name], [name]                       MRN: [id number] Proc. Date: 10/12/00 Adm. Date:  [id number] Attending:  Rich Brave CC:         Heather Roberts, M.D.  Claudette Laws, M.D.   Procedure Report  PROCEDURE:  Colonoscopy.  INDICATIONS:  Iron-deficiency anemia and family history of colon cancer in an 77 year old female with hemoccult negative stool.  FINDINGS:  Sigmoid diverticulosis and fixation, otherwise normal examination to the terminal ileum.  PROCEDURE:  The nature, purpose, and risks of the procedure were familiar to the patient from prior examination and she provided written consent.  Sedation for this procedure and the upper endoscopy, which preceded it, was fentanyl 30 mcg and Versed 2 mg IV without arrhythmias or desaturation during either procedure.  The adult adjustable-tension pediatric video colonoscope was advanced readily around the colon after getting through a very tight angulation in the sigmoid region, overcome by having the patient in the supine position.  The terminal ileum was entered for a moderate distance and appeared normal. Pullback was then performed.  The quality of the prep was excellent and it was felt that all areas were well seen.  There was some mild telangiectatic character to the mucosa of the cecum, but no discreet vascular malformations were observed.  No polyps, cancer or colitis were seen.   There was quite a bit of sigmoid diverticulosis.  Retroflexion in the rectum was negative except for some mucosal fragility characterized by some contact hemorrhage.  No biopsies were obtained during this procedure, which the patient tolerated well and without apparent complication.  IMPRESSION:  No source of iron-deficiency anemia or precancerous changes noted in this patient with history of iron-deficiency anemia and family history of colon cancer.  PLAN:   Follow-up per Dr. Heather Roberts. DD:  10/12/00 TD:  10/12/00 Job: [id number]

## 2010-10-11 NOTE — Discharge Summary (Signed)
[name], [name]                    ACCOUNT NO.:  [id number]   MEDICAL RECORD NO.:  [id number]                   PATIENT TYPE:  INP   LOCATION:  3712                                 FACILITY:  [facility]   PHYSICIAN:  Colleen Can. Deborah Chalk, M.D.            DATE OF BIRTH:  05/10/1915   DATE OF ADMISSION:  DATE OF DISCHARGE:                                 DISCHARGE SUMMARY   PRIMARY DISCHARGE DIAGNOSES:  1. Fall/syncope initially with atrial fibrillation now maintaining normal     sinus rhythm.  2. Hypokalemia.  3. Hypothyroidism.  4. Hypertension.  5. Cerebrovascular disease currently maintained on Plavix.  6. Osteoarthritis with p.r.n. use of Vioxx.   HISTORY OF PRESENT ILLNESS:  The patient is an 77 year old female who  presented to the emergency room after a questionable syncopal episode.  While reaching across the bottom of a cabinet on the day of admission for a  can of food, she felt that things went blurry.  She moved back across the  kitchen and had persistent lightheadedness and dizziness.  For a short time  she fell against the door and slid down to the floor.  She thought that she  was alert when she hit the floor but was somewhat pale.  EMS was called.  She was complaining of a headache just prior to sliding down to the floor.  She did injure her right shoulder with a bruise and had a slight laceration  to the left elbow.  In the emergency room she was noted to have atrial  fibrillation and subsequently converted to sinus rhythm with only  nonspecific ST changes noted.   She was subsequently seen and evaluated by Dr. Roger Shelter and was  admitted for further evaluation.   LABORATORY DATA:  On admission.  EKG initially showed atrial fibrillation  subsequently with conversion to normal sinus rhythm with first degree AV  block and nonspecific ST changes.  BUN 19, creatinine 1.2, glucose 145,  potassium was 3.4.  Hematocrit 41.   A CT of the head showed  atrophy with no acute abnormality.   A chest x-ray showed no active disease.   HOSPITAL COURSE:  The patient was admitted to telemetry.  Orthostatic blood  pressures were obtained.  She had a mild increase in cardiac enzymes which  was felt to not be significant.  Troponins were basically negative.  A 2-D  echocardiogram was performed which showed an ejection fraction to be 55-65%.  There was mitral valvular regurgitation with mitral valve prolapse.  The  following day after admission she was noted to be hypokalemic.  This was  replaced accordingly.  She was also noted to have bradycardia with heart  rates slowing to the 40s.  It was nonsustained.  She had four episodes  within five-seven minutes on October 25, 2002.  She was asymptomatic.   By the following day she was doing well.  There was concern for the possible  need of permanent pacemaker.  Her Plavix was placed on hold but since that  time she has had no further bradycardia.  Her current rhythm is sinus rhythm  with a first degree AV block.  Her potassium has been replaced accordingly  and has been stable.  Today on October 27, 2002 she is doing well.  She has had  one episode of some nausea that was without arrhythmia or further  bradycardia.  She is felt to be a stable candidate for discharge today.  She  has had no recurrence of atrial fibrillation.   LABORATORY DATA:  At discharge.  Her potassium was 3.7, BUN 15, creatinine  0.9.  Hematocrit 33.   CONDITION ON DISCHARGE:  Stable.   DISCHARGE MEDICATIONS:  She will resume all her medications and we will be  adding:  1. Potassium 30 mEq 1 p.o. daily.   DISCHARGE ACTIVITIES:  As tolerated.   FOLLOW UP:  We will see her back in our office in approximately two-three  weeks.  She is asked to call to set that appointment up.     Juanell Fairly C. Earl Gala, N.P.                 Colleen Can. Deborah Chalk, M.D.    LCO/MEDQ  D:  10/27/2002  T:  10/27/2002  Job:  [id number]   cc:   Christella Noa,  M.D.  [address] [place]  [place], [place] [address]  Fax: [phone]

## 2010-10-11 NOTE — Op Note (Signed)
[facility]  Patient:    [name], [name]                       MRN: [id number] Proc. Date: 01/08/00 Adm. Date:  [id number] Attending:  Madelyn Flavors CC:         [facility]             Heather Roberts, M.D.                           Operative Report  PREOPERATIVE DIAGNOSES:       Postmenopausal bleeding, thickened posterior endometrium.  POSTOPERATIVE DIAGNOSES:      Postmenopausal bleeding, thickened posterior endometrium.  PROCEDURE:                    Dilatation and curettage, hysteroscopy.  SURGEON:                      Beather Arbour. Thomasena Edis, M.D.  ANESTHESIA:                   Monitored anesthesia care, plus 10 cc 1% Lidocaine paracervical block.  FLUIDS:                       Approximately 800 cc of crystalloid.  ESTIMATED BLOOD LOSS:         Minimal.  COMPLICATIONS:                None.  DRAINS:                       None.  DESCRIPTION OF PROCEDURE:     The patient was brought to the operating room and identified on the operating table.  After patient was adequately sedated using monitored anesthesia care, she was placed in the dorsal lithotomy position and prepped and draped in the usual sterile fashion.  An examination under anesthesia revealed her uterus to be mid position, small and level without adnexal mass palpated.  The anterior lip of the cervix was infiltrated with 1 cc of 1% Lidocaine and grasped with a single-tooth tenaculum.  The remaining 9 cc of 1% Lidocaine were placed for a paracervical block.  The uterus sounded approximately 6 cm.  The cervix was very carefully and gently dilated up to a #23 Pratt dilator.  This dilatation preceded very carefully and gently to decrease the risk of uterine perforation.  Using the ACMI scope and Sorbitol as a distending medium, the hysteroscope was placed into the endometrial cavity and a careful and thorough hysteroscopic examination was performed.  Both tubal ostia were  identified and photographed.  The anterior endometrium was noted to be thin and atrophic consistent with the patients postmenopausal state.  The posterior endometrium was thickened.  The hysteroscope was withdrawn and the uterus was systemically curetted in a clockwise fashion with tissue obtained.  The Randall stone forceps were placed and additional tissue was obtained.  A good cry was heard all around.  After the uterus was curetted systematically the curettings were sent to pathology for examination.  The procedure was then terminated.  The speculum was removed and then the tenaculum was removed.  There was noted to be no bleeding from the tenaculum site.  The patient tolerated procedure well without apparent complications and was transferred to the recovery room in stable condition after instrument  count, sponge count and needle counts were correct.  She was given the post [facility] instruction sheet, urged to make no financial decisions, urged to have someone with her the next 24 hours and to take Advil 400 mg q.6h. p.r.n. pain.  She is to call for any problems and to return for a postoperative evaluation in two to three weeks.  DD:  01/08/00 TD:  01/08/00 Job: [id number]

## 2010-10-11 NOTE — Op Note (Signed)
[facility]  Patient:    [name], [name]                       MRN: [id number] Proc. Date: 10/01/00 Adm. Date:  [id number] Attending:  Monica Becton                           Operative Report  PREOPERATIVE DIAGNOSES: 1. Papillary bladder carcinoma. 2. Gross hematuria.  POSTOPERATIVE DIAGNOSES: 1. Papillary bladder carcinoma. 2. Gross hematuria.  OPERATION:  Cystoscopy and transurethral resection of bladder tumor and fulguration.  SURGEON:  Claudette Laws, M.D.  DESCRIPTION OF PROCEDURE:  The patient was prepped and draped in the dorsal lithotomy position under LMA anesthesia.  The bimanual exam revealed no obvious palpable tumor.  The uterus felt normal.  The pelvis was not fixed. No other pelvic masses.  Cystoscopy was then performed, and appropriate pictures were taken of the midline medium-sized bladder tumor.  Initially, we scraped off the superficial portion of the lesion and sent that for #1.  I then resected a little deeper and called that #2.  Number 3 was a little deeper.  At the base of the tumor, I fulgurated about 1 cm around it.  Clinically I thought this was a noninvasive tumor.  The rest of the bladder looked normal with no other suspicious lesions, no tumors, normal ureteral orifices.  The bladder tumor itself was in the midline just above the trigonal area.  At the conclusion, a #18 [place] 10 cc Foley catheter was hooked to straight drain, and a B&O suppository was placed per rectum.  The patient was then taken back to the recovery room in satisfactory condition. DD:  10/01/00 TD:  10/01/00 Job: [id number]

## 2010-10-11 NOTE — H&P (Signed)
NAME:  [name], [name]                    ACCOUNT NO.:  [id number]   MEDICAL RECORD NO.:  [id number]                   PATIENT TYPE:  OIB   LOCATION:                                       FACILITY:  [facility]   PHYSICIAN:  Colleen Can. Deborah Chalk, M.D.            DATE OF BIRTH:  1915-02-14   DATE OF ADMISSION:  02/06/2004  DATE OF DISCHARGE:                                HISTORY & PHYSICAL   CHIEF COMPLAINT:  History of syncope.   HISTORY OF PRESENT ILLNESS:  The patient is a very pleasant 77 year old  female who has had recurrent bouts of syncope.  She has had a recent [employer] event monitor placed for one month without any episodes of syncope.  She now presents for elective implantation of an implantable loop recorder.  She has had significant injury associated with her falls secondary to  syncope.   She was seen in the office for a follow-up evaluation on September 6.  At  that time, she had no recurrent episodes of syncope over the past month.  The episodes have basically been undiagnosed, but in the past she has had  prior atrial fibrillation.  Her EKG shows a resting first degree AV block.   PAST MEDICAL HISTORY:  1.  Previous syncopal episode last occurring in August of 2005.  2.  Prior history of atrial fibrillation, currently in sinus rhythm.  3.  Chronic first degree AV block.  4.  Prior history of anemia.  5.  History of hypertension.  6.  Cerebrovascular disease maintained on chronic Plavix.  7.  History of osteoarthritis.  8.  Hypothyroidism.  9.  History of hypokalemia.   ALLERGIES:  1.  CODEINE.  2.  SULFA.  3.  CIPRO.   CURRENT MEDICATIONS:  1.  Synthroid 75 mcg a day.  2.  Hyzaar 50/12.5 mg daily.  3.  Baby aspirin daily.  4.  Plavix 75 mg daily.  5.  Fosamax weekly.  6.  Caltrate daily.  7.  Ocuvite daily.  8.  Potassium 10 mEq daily.   FAMILY HISTORY:  Noncontributory.   SOCIAL HISTORY:  She is a retired Company secretary.  She has two children.  She  has no alcohol use.  She does use snuff as a tobacco product.   PAST SURGICAL HISTORY:  1.  Previous back surgery.  2.  Bladder surgery.  3.  Left sided inguinal hernia repair.  4.  Previous left eye enucleation.   REVIEW OF SYMPTOMS:  Basically as noted above.  CARDIOPULMONARY:  She has  had no current chest pain or shortness of breath.  Her rhythm has remained  stable.  She has had significant injury with previous syncopal episodes.  The last time when she passed out in August, she had bruising over the right  chin after hitting the kitchen counter.  ORTHOPEDIC:  She has had no  fracture.   PHYSICAL EXAMINATION:  GENERAL:  On exam, she is a very pleasant elderly  white female who appears younger than her stated age.  VITAL SIGNS:  Blood pressure is 140/70 sitting and 140/60 standing, heart  rate 88 and regular, respirations 18 and she is afebrile.  SKIN:  Warm and dry.  The color is unremarkable.  LUNGS:  Basically clear.  CARDIOVASCULAR:  The heart shows a regular rhythm.  ABDOMEN:  Soft with positive bowel sounds and non-tender.  EXTREMITIES:  Without edema.  NEUROLOGIC:  Intact.   LABORATORY DATA:  Pertinent laboratories are pending.   IMPRESSION:  1.  Syncope of questionable etiology with negative [place] [employer] Hearts monitor.  2.  Past history of atrial fibrillation.  3.  History of cerebrovascular disease maintained on chronic Plavix.  4.  Hypertension.  5.  Hypothyroidism, on replacement.  6.  History of hypokalemia, on replacement.   PLAN:  We will proceed on with implantation of a loop recorder.  It is felt  that with the significance in injury that she has had in the past associated  with her falls that her syncope will need to be investigated further from a  cardiac standpoint.  The procedure was reviewed in full detail with both her  and her family member including the risks of the procedure and the benefits.  She is willing to proceed on Tuesday, February 06, 2004.      Sharlee Blew, N.P.                     Colleen Can. Deborah Chalk, M.D.    LC/MEDQ  D:  01/31/2004  T:  01/31/2004  Job:  [id number]   cc:   Lovenia Kim, D.O.  [address]  [place]  [place] [address]  Fax: [phone]

## 2010-10-11 NOTE — Discharge Summary (Signed)
[facility]  Patient:    Anne, Frazier [facility] Visit Number: [id number] MRN: [id number]          Service Type: SUR Location: 4W 0451 01 Attending Physician:  Pierce Crane Dictated by:   Marcie Bal Velna Ochs, P.A.-C Admit Date:  01/29/2001 Discharge Date: 02/01/2001   CC:         Heather Roberts, M.D.   Discharge Summary  DISCHARGE DIAGNOSES: 1. Spinal stenosis at L4-L5. 2. Hypercholesterolemia. 3. Hypothyroidism. 4. History of bladder tumor/cancer. 5. Diverticulosis.  PROCEDURES:  Central lumbar decompression, L4 laminectomy, partial laminectomy at L5, foraminotomies at L4 and L5 by Dr. Jillyn Hidden with the assistance of Dr. Darrelyn Hillock on January 29, 2001.  Please see operative summary for further details.  CONSULTATIONS:  None.  LABORATORY DATA AND X-RAY FINDINGS:  EKG preoperatively showed sinus rhythm with atrial premature complexes and trigeminy.  An EKG on September 7, showed normal sinus rhythm with first-degree AV block, otherwise normal EKG. Urinalysis preoperatively showed small leukocyte esterase, but only 0-2 wbcs, negative nitrites.  On postop day #1, H&H were 11.6 and 32.8 respectively with a platelet count of 271.  This remained stable on postop day #2, with an H&H of 11.3 and 32.4 respectively.  Routine chemistry postop was all within normal limits.  On postop day #1 and postop day #2, sodium was just slightly down at 134.  BUN and creatinine were within normal limits at 10 and 0.7 respectively.  CHIEF COMPLAINT:  Back pain.  HISTORY OF PRESENT ILLNESS:  [name] is an 77 year old female who has had persistent and worsening lower back pain.  The pain has been radiating into her left lower extremity.  This had been going on for several months with an increase in severity.  She was treated conservatively initially with antiinflammatory medications as well as narcotic pain medications.  She had undergone a series of epidural  steroid injections without significant relief. She had an MRI obtained which showed moderate spinal stenosis and bilateral neural foraminal stenosis at L4-L5.  Because of the lack of improvement with conservative measures and the objective findings, it is recommended that she may benefit from surgical intervention.  Preoperative labs and consents were obtained.  The patient was also seen by her medical physician, Dr. Heather Roberts, who evaluated the patient and felt that she was stable and clear for surgery.  She was admitted on September 6, for surgical intervention.  HOSPITAL COURSE:  Following the surgical procedure, the patient was taken to the PACU in stable condition and transferred to the orthopedic floor in good condition.  She did well during her hospital stay.  On postop day #1, she was started on some Ambien as she had difficulty sleeping.  This apparently made her quite groggy and on postop day #2 was sleepy and felt somewhat weak. Physical exam found no focal abnormalities.  The Ambien was stopped.  Postop day #3, she was actually feeling much better with no focal findings and was anxious and ready for discharge home.  The wound was examined on postop day #2 and subsequently found to be clean, dry and intact without signs or symptoms of infection.  She was moving both extremities without weakness or difficulty. She reported that the leg symptoms that she had preop were now resolved.  By postop day #3, she was stable and ready for discharge home as she had done well with physical therapy.  DISPOSITION:  The patient will be discharged home to the care of her  family who will be staying with her.  DIET:  Resume regular diet.  ACTIVITY:  No bending, stooping or heavy lifting.  WOUND CARE:  Once daily dressing changes to the back.  DISCHARGE MEDICATIONS: 1. Fosamax 70 mg once weekly. 2. Aspirin 81 mg a day. 3. Synthroid 0.075 mg a day. 4. Robaxin 500 mg q.8h. as needed  for spasm. 5. Darvocet-N 100 one every four to six hours as needed for pain.  FOLLOWUP:  Follow up with Dr. Shelle Iron in approximately 10 days.  Call [phone], for an appointment.  CONDITION ON DISCHARGE:  Good and improved. Dictated by:   Marcie Bal Velna Ochs, P.A.-C Attending Physician:  Pierce Crane DD:  02/01/01 TD:  02/01/01 Job: [phone] [id number]

## 2010-10-11 NOTE — Cardiovascular Report (Signed)
NAMEIRIEL, NASON                    ACCOUNT NO.:  [id number]   MEDICAL RECORD NO.:  [id number]                   PATIENT TYPE:  OIB   LOCATION:  2860                                 FACILITY:  [facility]   PHYSICIAN:  Colleen Can. Deborah Chalk, M.D.            DATE OF BIRTH:  [birth year]   DATE OF PROCEDURE:  DATE OF DISCHARGE:                              CARDIAC CATHETERIZATION   HISTORY OF PRESENT ILLNESS:  Ms. Aday is an 77 year old female with  recurrent episodes of syncope and a negative event monitor.  Because of the  significance of the syncopal episodes and bodily injury, she is referred now  for implantation of a loop recorder.   PROCEDURE:  The left parasternal region was prepped and draped.  The area  was infiltrated with 1% Xylocaine.  Subcutaneous pocket was created with  sharp and Bovie dissection.  A Medtronic Reveal plus model [id number], serial #  [id number] was inserted and sutured in place with two sutures.  The wound  was flushed with kanamycin solution.  The wound was closed with 5-0 Vicryl  and Steri-Strips were applied.  The patient tolerated the procedure well.                                               Colleen Can. Deborah Chalk, M.D.    SNT/MEDQ  D:  02/06/2004  T:  02/06/2004  Job:  [id number]   cc:   Lovenia Kim, D.O.  [address]  [place]  [place] [address]  Fax: [phone]

## 2010-10-11 NOTE — H&P (Signed)
NAMELUISANA, [name]                    ACCOUNT NO.:  [id number]   MEDICAL RECORD NO.:  [id number]                   PATIENT TYPE:  EMS   LOCATION:  MAJO                                 FACILITY:  [facility]   PHYSICIAN:  Colleen Can. Deborah Chalk, M.D.            DATE OF BIRTH:  14-Mar-1915   DATE OF ADMISSION:  10/24/2002  DATE OF DISCHARGE:                                HISTORY & PHYSICAL   HISTORY:  [name] is an 77 year old female with two children who is  retired as a Company secretary, nonsmoker and no alcohol, but she does use snuff  as a tobacco product.   HISTORY OF PRESENT ILLNESS:  She apparently reached the bottom of a cabinet  today for a can of foods and then things felt blurry.  She moved back across  the kitchen, and persisted with lightheadedness and dizziness.  Then for a  short time, she fell against the door stiff and slid to the floor.  She is  alert when she hit the bottom, but white and somewhat pale.  EMS was called.  She was brought to the emergency room.  When she fell against the door  stiff, she did hit her head, but she was complaining of a headache before  the fall.  She did injury her right shoulder with a bruise and a slight  laceration and also on her left elbow.   PAST MEDICAL HISTORY:  She had surgery two years ago and back surgery,  bladder surgery, and including an ulcer.  She had a left-sided inguinal  hernia repair.  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   ALLERGIES:  Codeine, sulfa, and Cipro.   CURRENT MEDICATIONS:  Synthroid 75 mcg per day, Hyzaar 50/12.5 half per day,  baby aspirin a day, Fosamax, Plavix 75 mg a day.   CURRENT MEDICAL PROBLEMS:  Hypertension over the last year and thyroid  abnormality.  Otherwise, she is basically seen for annual physical.   FAMILY HISTORY:  Her father died at age 18 of an MI.  Mother died at age 16  of questionable causes.  It apparently involved a hemorrhage.  One brother  died of an MI at age 49.  Sister died 3-4 years of diphtheria.  Her brother  died of CVA and cancer at age 54.   REVIEW OF SYSTEMS:  She did not work in the yard this year mainly because of  dizziness, but she says active within her house.  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  She has a headache  occasionally.  Pulmonary with occasional mucus.  CV:  Palpitations.  There  is no chest pain.  GI is associated with some mild bowel trouble and  occasional diarrhea.  GU:  Negative.  Musculoskeletal:  Generalized  arthritis.  She takes Vioxx for that.   LABORATORY:  Pertinent labs include an  EKG which shows atrial fibrillation,  and subsequently converted to sinus rhythm with only nonspecific ST changes  noted.   Her BUN is 19, creatinine is 1.2, glucose 145, potassium 3.4, sodium 138,  chloride 103, CO2 28.  Hematocrit is 41.   CT scan of the head is pending.   OVERALL IMPRESSION:  Syncope with atrial fibrillation, currently sinus  rhythm.   PLAN:  The patient will be admitted and will be on telemetry.  We will check  orthostatics.  Her current blood pressure is 114/72.  Current heart rate is  87.                                                Colleen Can. Deborah Chalk, M.D.    SNT/MEDQ  D:  10/24/2002  T:  10/24/2002  Job:  [id number]   cc:   Christella Noa, M.D.  [address]  [place], [place] [address]  Fax: [phone]

## 2010-10-11 NOTE — H&P (Signed)
[facility]  Patient:    [name], [name] Visit Number: [id number] MRN: [id number]          Service Type: Attending:  Javier Docker, M.D. Dictated by:   Alexzandrew L. Perkins, P.A.-C. Adm. Date:  01/29/01   CC:         Heather Roberts, M.D.  Claudette Laws, M.D.  Beather Arbour Thomasena Edis, M.D.   History and Physical  DATE OF BIRTH:  Aug 16, 1914  CHIEF COMPLAINT:  Back pain.  HISTORY OF PRESENT ILLNESS:  The patient is an 77 year old female who has been seen and evaluated by Dr. Jene Every for ongoing back pain.  The patient states her pain has been radiating down from her back into the left lower extremity.  This has been ongoing for some time now.  Over the past several months, her pain has started to increase in severity.  She is seen and evaluated, and felt to have some lateral recess stenosis with some radiculopathy into the left leg.  She has been treated conservatively in the past, having been treated with anti-inflammatory medications and narcotic medications.  She has also undergone epidural steroid injections.  She was sent for an MRI of the lumbar spine which did show some moderate spinal stenosis and bilateral neural foraminal stenosis at the level of L4-5.  The MRI was done back in April 2002.  Again, she has been treated conservatively for this.  The epidural steroid injections only provided temporary relief. Due to the fact she has not improved overall with conservative treatment, it is felt that she could benefit from undergoing surgical intervention.  Risks and benefits of this procedure have been discussed with the patient, and she has elected to proceed with surgery.  ALLERGIES:  No known drug allergies.  INTOLERANCES:  Codeine causes sickness.  Talwin causes sickness and dizziness.  CURRENT MEDICATIONS: 1. Fosamax 70 mg weekly. 2. Aspirin 81 mg one p.o. q.d. (the patient has stopped this medication prior    to  surgery). 3. Synthroid one p.o. q.d. 4. Darvocet two q.6h. p.r.n. pain.  PAST MEDICAL HISTORY: 1. Hypercholesterolemia. 2. Hypothyroidism. 3. History of bladder tumor/cancer, diverticulosis.  PAST SURGICAL HISTORY: 1. Status post excision of bladder tumor which was cancerous. 2. Left inguinal hernia repair. 3. Left eye removal.  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 4. Right eye cataract surgery.  SOCIAL HISTORY:  The patient is single, has one daughter.  Denies use of tobacco products or alcohol products.  FAMILY HISTORY:  Mother deceased in her late 62s early 62s, cause unknown. Father deceased at age 21 secondary to heart attack.  There is a family history significant for cancer and myocardial infarction.  The patient did have a brother who died at the age of 57 with myocardial infarction.  The patient also had another brother who has deceased at the age of 64 secondary to pancreatic cancer and colon cancer.  REVIEW OF SYSTEMS:  GENERAL:  No fever, chills, or night sweats.  NEUROLOGIC:  No seizures, syncope, paralysis.  The patient does have a prosthetic eye on the left.  RESPIRATORY:  No shortness of breath, productive cough, or hemoptysis.  CARDIOVASCULAR:  No chest pain, angina, or orthopnea.  GASTROINTESTINAL:  No nausea, vomiting, diarrhea, or constipation.  GENITOURINARY:  No dysuria or hematuria.  MUSCULOSKELETAL:  Pertinent to the back and left leg found in the history of present illness.  PHYSICAL EXAMINATION:  VITAL SIGNS:  Pulse 76, respiratory rate 16, blood  pressure 130/68.  GENERAL:  The patient is an 77 year old, thin, petite, white female, well-developed.  She is alert and oriented and cooperative, very pleasant at the time of exam.  She is accompanied by her daughter for her history and physical.  HEENT:  Normocephalic, atraumatic.  Her right eye is reactive.  Extraocular movement is intact to the right eye.  She does have a left eye  prosthetic implant.  Oropharynx is clear.  NECK:  Supple, no carotid bruits are appreciated in the neck.  CHEST:  Clear to auscultation and percussion, no rhonchi or rales.  HEART:  Regular rate and rhythm, no murmur, S1 and S2 noted, no rubs, thrills, palpitations are noted on exam.  RECTAL/BREASTS/GENITALIA:  Not done, not pertinent to present illness.  EXTREMITIES:  She does have a positive straight leg raise on the left with reproduction of buttock pain.  Motor function testing is intact.  The EHL on the left is 5/5.  She is tender to palpation over the left PSIS, and also tender along the lumbosacral junction.  Her pelvis is noted to be stable on exam.  Sensation is grossly intact throughout the left lower extremity.  IMPRESSION: 1. Lumbar spinal stenosis at L4-5. 2. Hypercholesterolemia. 3. Hypothyroidism. 4. Diverticulosis. 5. Status post excision bladder cancer tumor. 6. Status post left eye excision/left prosthetic eye implant.  PLAN:  The patient will be admitted to [facility] to undergo lumbar decompression at L4-5.  Surgery will be performed by Dr. Jene Every.  The patients medical physician is Dr. Heather Roberts.  Dr. Orson Aloe has seen the patient preoperatively and cleared her for surgery.  Dr. Orson Aloe will be notified of the room number admission, and will be consulted if needed for any medical assistance with this patient throughout the hospital course. Dictated by:   Alexzandrew L. Perkins, P.A.-C. Attending:  Javier Docker, M.D. DD:  01/19/01 TD:  01/20/01 Job: [id number]

## 2010-10-11 NOTE — Cardiovascular Report (Signed)
[name], [name]                ACCOUNT NO.:  [id number]   MEDICAL RECORD NO.:  [id number]          PATIENT TYPE:  OIB   LOCATION:  2854                         FACILITY:  [facility]   PHYSICIAN:  Colleen Can. Deborah Chalk, M.D.DATE OF BIRTH:  [birth year]   DATE OF PROCEDURE:  04/22/2005  DATE OF DISCHARGE:  04/22/2005                              CARDIAC CATHETERIZATION   PROCEDURE:  Explantation of Reval Plus 9526 event monitor.   PROCEDURE:  The patient is prepped and draped. The area over top of the  Reval Plus in the anterior chest just left to the sternum was prepped and  draped. The area was infiltrated with 1% Xylocaine. Using sharp and Bovie  dissection, the Reval Plus 9526, serial number [id number] was located.  Sutures were removed. The unit was explanted. The wound was flushed with  Kanamycin solution and hemostasis was obtained. The wound was closed with 2-  0 and subsequently 4-0 Vicryl. Steri-Strips were applied. The patient  tolerated the procedure well.      Colleen Can. Deborah Chalk, M.D.  Electronically Signed     SNT/MEDQ  D:  04/22/2005  T:  04/22/2005  Job:  [id number]

## 2010-11-22 ENCOUNTER — Ambulatory Visit: Payer: MEDICARE | Admitting: Cardiovascular Disease

## 2010-12-02 ENCOUNTER — Encounter: Payer: Self-pay | Admitting: Nurse Practitioner

## 2010-12-02 ENCOUNTER — Ambulatory Visit (INDEPENDENT_AMBULATORY_CARE_PROVIDER_SITE_OTHER): Payer: Medicare Other | Admitting: Nurse Practitioner

## 2010-12-02 DIAGNOSIS — I10 Essential (primary) hypertension: Secondary | ICD-10-CM

## 2010-12-02 DIAGNOSIS — I4891 Unspecified atrial fibrillation: Secondary | ICD-10-CM

## 2010-12-02 NOTE — Patient Instructions (Signed)
Stay on your current medicines Your blood pressure cuff runs 10 points higher I will see you in 6 months.

## 2010-12-02 NOTE — Assessment & Plan Note (Signed)
Rate is controlled. She is not on anticoagulation due to high fall risk and unsteadiness.

## 2010-12-02 NOTE — Progress Notes (Signed)
[name] Date of Birth: 1914-07-21   History of Present Illness: [name] is seen back today for a follow up visit. She is seen for Dr. Elease Frazier. She is a former patient of Dr. Ronnald Frazier. She is doing well. She does have issues with chronic back pain and has to use narcotics on occasion. That makes her nauseated and not feel to well. She had to take some this past weekend. She is otherwise doing ok. She has had to use some extra Atenolol just once or twice for her blood pressure. For the most part, her blood pressure is ok. She is not having chest pain. She does have atrial fib. She is not on coumadin because of high fall risk. She remains very unsteady on her feet. No recent falls reported.   Current Outpatient Prescriptions on File Prior to Visit  Medication Sig Dispense Refill  . aspirin 81 MG tablet Take 81 mg by mouth daily.        Marland Kitchen atenolol (TENORMIN) 25 MG tablet Take 12.5 mg by mouth daily.        . furosemide (LASIX) 40 MG tablet Take 20 mg by mouth daily.        Marland Kitchen levothyroxine (SYNTHROID, LEVOTHROID) 75 MCG tablet Take 75 mcg by mouth daily.        . Multiple Vitamins-Minerals (OCUVITE PRESERVISION PO) Take by mouth 2 (two) times daily.        Marland Kitchen DISCONTD: Calcium Carbonate (CALTRATE 600 PO) Take by mouth 2 (two) times daily.        Marland Kitchen DISCONTD: cholecalciferol (VITAMIN D) 1000 UNITS tablet Take 1,000 Units by mouth daily.          Allergies  Allergen Reactions  . Ciprocin-Fluocin-Procin (Fluocinolone Acetonide)   . Codeine   . Sulfa Drugs Cross Reactors     Past Medical History  Diagnosis Date  . Syncope and collapse     Negative loop recorder  . Hypertension   . AV block, 1st degree   . CVD (cerebrovascular disease)   . Osteoarthritis   . Hypothyroidism   . Hypokalemia   . Diarrhea   . Atrial fibrillation   . Pneumonia 09/11  . Traumatic enucleation of eye     left eye  . History of echocardiogram 9/11    normal EF, mild to mod MR & TR, mod pulmonary HTN     Past Surgical History  Procedure Date  . Back surgery   . Inguinal hernia repair     left sided    History  Smoking status  . Never Smoker   Smokeless tobacco  . Not on file    History  Alcohol Use No    Family History  Problem Relation Age of Onset  . Heart attack Father   . Heart attack Brother   . Cancer Brother   . Stroke Brother     Review of Systems: The review of systems is positive for chronic back pain. She will be having her physical next week with primary care.  All other systems were reviewed and are negative.  Physical Exam: BP 148/70  Pulse 80  Ht 5\' 2"  (1.575 m)  Wt 110 lb 9.6 oz (50.168 kg)  BMI 20.23 kg/m2 Patient is very pleasant and in no acute distress. She looks younger than her stated age. Skin is warm and dry. Color is normal.  HEENT is unremarkable. Normocephalic/atraumatic. PERRL. Sclera are nonicteric. Neck is supple. No masses. No JVD. Lungs are  clear. Cardiac exam shows an irregular rhythm. Her rate is controlled. Abdomen is soft. Extremities are without edema. Gait and ROM are intact. No gross neurologic deficits noted.  LABORATORY DATA:   Assessment / Plan:

## 2010-12-02 NOTE — Assessment & Plan Note (Signed)
Blood pressure at home looks good. Her machine is checked and actually runs about 10 points higher. No change in her medicines. I will see her back in about 6 months.

## 2011-02-06 ENCOUNTER — Emergency Department (HOSPITAL_COMMUNITY)
Admission: EM | Admit: 2011-02-06 | Discharge: 2011-02-06 | Disposition: A | Discharge status: Home / Self Care | Payer: Medicare Other | Attending: Emergency Medicine | Admitting: Emergency Medicine

## 2011-02-06 ENCOUNTER — Emergency Department (HOSPITAL_COMMUNITY): Payer: Medicare Other

## 2011-02-06 DIAGNOSIS — E039 Hypothyroidism, unspecified: Secondary | ICD-10-CM | POA: Insufficient documentation

## 2011-02-06 DIAGNOSIS — I10 Essential (primary) hypertension: Secondary | ICD-10-CM | POA: Insufficient documentation

## 2011-02-06 DIAGNOSIS — R404 Transient alteration of awareness: Secondary | ICD-10-CM | POA: Insufficient documentation

## 2011-02-06 DIAGNOSIS — R42 Dizziness and giddiness: Secondary | ICD-10-CM | POA: Insufficient documentation

## 2011-02-06 DIAGNOSIS — R5381 Other malaise: Secondary | ICD-10-CM | POA: Insufficient documentation

## 2011-02-06 DIAGNOSIS — G319 Degenerative disease of nervous system, unspecified: Secondary | ICD-10-CM | POA: Insufficient documentation

## 2011-02-06 DIAGNOSIS — I498 Other specified cardiac arrhythmias: Secondary | ICD-10-CM | POA: Insufficient documentation

## 2011-02-06 DIAGNOSIS — E860 Dehydration: Secondary | ICD-10-CM | POA: Insufficient documentation

## 2011-02-06 DIAGNOSIS — I4891 Unspecified atrial fibrillation: Secondary | ICD-10-CM | POA: Insufficient documentation

## 2011-02-06 LAB — DIFFERENTIAL
Basophils Absolute: 0 10*3/uL (ref 0.0–0.1)
Basophils Relative: 0 % (ref 0–1)
Eosinophils Absolute: 0.5 10*3/uL (ref 0.0–0.7)
Eosinophils Relative: 4 % (ref 0–5)
Lymphs Abs: 1.8 10*3/uL (ref 0.7–4.0)
Neutrophils Relative %: 67 % (ref 43–77)

## 2011-02-06 LAB — URINALYSIS, ROUTINE W REFLEX MICROSCOPIC
Leukocytes, UA: NEGATIVE
Nitrite: NEGATIVE
Specific Gravity, Urine: 1.009 (ref 1.005–1.030)
Urobilinogen, UA: 0.2 mg/dL (ref 0.0–1.0)
pH: 8.5 — ABNORMAL HIGH (ref 5.0–8.0)

## 2011-02-06 LAB — CBC
MCV: 92.8 fL (ref 78.0–100.0)
Platelets: 271 10*3/uL (ref 150–400)
RBC: 4.43 MIL/uL (ref 3.87–5.11)
RDW: 12.4 % (ref 11.5–15.5)
WBC: 12.1 10*3/uL — ABNORMAL HIGH (ref 4.0–10.5)

## 2011-02-06 LAB — COMPREHENSIVE METABOLIC PANEL
Albumin: 4 g/dL (ref 3.5–5.2)
BUN: 21 mg/dL (ref 6–23)
Calcium: 9.9 mg/dL (ref 8.4–10.5)
Chloride: 91 mEq/L — ABNORMAL LOW (ref 96–112)
Creatinine, Ser: 1.09 mg/dL (ref 0.50–1.10)
GFR calc non Af Amer: 47 mL/min — ABNORMAL LOW (ref >60)
Total Bilirubin: 0.9 mg/dL (ref 0.3–1.2)

## 2011-02-06 LAB — POCT I-STAT TROPONIN I: Troponin i, poc: 0 ng/mL (ref 0.00–0.08)

## 2011-02-06 LAB — PROTIME-INR: Prothrombin Time: 12.8 seconds (ref 11.6–15.2)

## 2011-02-06 LAB — GLUCOSE, CAPILLARY: Glucose-Capillary: 82 mg/dL (ref 70–99)

## 2011-02-07 LAB — URINE CULTURE
Culture  Setup Time: 201209131215
Culture: NO GROWTH

## 2011-03-04 ENCOUNTER — Other Ambulatory Visit: Payer: Self-pay | Admitting: Cardiology

## 2011-03-12 LAB — URINE MICROSCOPIC-ADD ON

## 2011-03-12 LAB — BASIC METABOLIC PANEL
BUN: 22
Chloride: 101
Glucose, Bld: 95
Potassium: 4.2

## 2011-03-12 LAB — URINALYSIS, ROUTINE W REFLEX MICROSCOPIC
Glucose, UA: NEGATIVE
Specific Gravity, Urine: 1.008
Urobilinogen, UA: 0.2
pH: 5.5

## 2011-03-12 LAB — CBC
HCT: 33.8 — ABNORMAL LOW
MCV: 92.7
Platelets: 284
RDW: 13.1

## 2011-03-20 ENCOUNTER — Other Ambulatory Visit: Payer: Self-pay | Admitting: *Deleted

## 2011-03-20 MED ORDER — ATENOLOL 25 MG PO TABS: 12.5000 mg | ORAL_TABLET | Freq: Every day | ORAL | Status: DC

## 2011-06-06 ENCOUNTER — Ambulatory Visit (INDEPENDENT_AMBULATORY_CARE_PROVIDER_SITE_OTHER): Payer: Medicare Other | Admitting: Nurse Practitioner

## 2011-06-06 ENCOUNTER — Encounter: Payer: Self-pay | Admitting: Cardiovascular Disease

## 2011-06-06 ENCOUNTER — Encounter: Payer: Self-pay | Admitting: Nurse Practitioner

## 2011-06-06 VITALS — BP 128/62 | HR 78 | Ht 62.0 in | Wt 115.0 lb

## 2011-06-06 DIAGNOSIS — M79604 Pain in right leg: Secondary | ICD-10-CM

## 2011-06-06 DIAGNOSIS — I4891 Unspecified atrial fibrillation: Secondary | ICD-10-CM

## 2011-06-06 DIAGNOSIS — R0600 Dyspnea, unspecified: Secondary | ICD-10-CM

## 2011-06-06 DIAGNOSIS — M79609 Pain in unspecified limb: Secondary | ICD-10-CM

## 2011-06-06 DIAGNOSIS — I10 Essential (primary) hypertension: Secondary | ICD-10-CM

## 2011-06-06 LAB — BRAIN NATRIURETIC PEPTIDE: Brain Natriuretic Peptide: 146.3 pg/mL — ABNORMAL HIGH (ref 0.0–100.0)

## 2011-06-06 LAB — BASIC METABOLIC PANEL
BUN: 26 mg/dL — ABNORMAL HIGH (ref 6–23)
CO2: 26 mEq/L (ref 19–32)
Calcium: 10 mg/dL (ref 8.4–10.5)
Chloride: 103 mEq/L (ref 96–112)
Creat: 1.15 mg/dL — ABNORMAL HIGH (ref 0.50–1.10)
Glucose, Bld: 85 mg/dL (ref 70–99)
Potassium: 3.8 mEq/L (ref 3.5–5.3)
Sodium: 139 mEq/L (ref 135–145)

## 2011-06-06 LAB — CBC WITH DIFFERENTIAL/PLATELET
Basophils Absolute: 0.1 10*3/uL (ref 0.0–0.1)
Basophils Relative: 1 % (ref 0–1)
Eosinophils Absolute: 0.7 10*3/uL (ref 0.0–0.7)
Eosinophils Relative: 8 % — ABNORMAL HIGH (ref 0–5)
HCT: 37.7 % (ref 36.0–46.0)
Hemoglobin: 12.6 g/dL (ref 12.0–15.0)
Lymphocytes Relative: 31 % (ref 12–46)
Lymphs Abs: 2.9 10*3/uL (ref 0.7–4.0)
MCH: 31.8 pg (ref 26.0–34.0)
MCHC: 33.4 g/dL (ref 30.0–36.0)
MCV: 95.2 fL (ref 78.0–100.0)
Monocytes Absolute: 1.4 10*3/uL — ABNORMAL HIGH (ref 0.1–1.0)
Monocytes Relative: 15 % — ABNORMAL HIGH (ref 3–12)
Neutro Abs: 4.4 10*3/uL (ref 1.7–7.7)
Neutrophils Relative %: 46 % (ref 43–77)
Platelets: 304 10*3/uL (ref 150–400)
RBC: 3.96 MIL/uL (ref 3.87–5.11)
RDW: 12.5 % (ref 11.5–15.5)
WBC: 9.6 10*3/uL (ref 4.0–10.5)

## 2011-06-06 NOTE — Patient Instructions (Signed)
We are going to check some labs to see if your heart is causing you to be more short of breath.  When we call you on Monday with your labs, tell the nurse if your leg is still hurting. If it is, we are going to get an ultrasound of it to make sure you do not have blood clots.  Try to minimize your salt.  Stay on your current medicines for now. We will let you know if we need to increase your fluid pills.  Call the [facility] office at [phone] if you have any questions, problems or concerns.   I will see you in 3 months.

## 2011-06-06 NOTE — Assessment & Plan Note (Signed)
Rate is controlled. She is not felt to be a candidate for anticoagulation.

## 2011-06-06 NOTE — Progress Notes (Addendum)
[name] Date of Birth: 12-01-1914 Medical Record #[id number]  History of Present Illness: [name] is seen back today for her 6 month check. She is seen for Dr. Elease Hashimoto. She is here with her son. She is 89.  She remains active. No chest pain. Does have chronic back pain. Has been having more spells of DOE over the past 6 to 8 weeks. She has been eating more and has probably had more salt. She is unsteady on her feet. No falls but did have a mishap back in October where a mattress fell over on her. Fortunately, she was not injured. She has increased her Lasix to 40 mg. No real results noted. Blood pressure has been ok for the most part but she does use an extra Atenolol prn. She has had some cramps and discomfort in her right leg. She has multiple varicosities.   Current Outpatient Prescriptions on File Prior to Visit  Medication Sig Dispense Refill  . aspirin 81 MG tablet Take 81 mg by mouth daily.        Marland Kitchen atenolol (TENORMIN) 25 MG tablet Take 0.5 tablets (12.5 mg total) by mouth daily.  30 tablet  4  . furosemide (LASIX) 40 MG tablet TAKE 1 TABLET EVERY DAY  30 tablet  6  . levothyroxine (SYNTHROID, LEVOTHROID) 75 MCG tablet Take 75 mcg by mouth daily.        . Multiple Vitamins-Minerals (OCUVITE PRESERVISION PO) Take by mouth 2 (two) times daily.          Allergies  Allergen Reactions  . Ciprocin-Fluocin-Procin (Fluocinolone Acetonide)   . Codeine   . Sulfa Drugs Cross Reactors     Past Medical History  Diagnosis Date  . Syncope and collapse     Negative loop recorder in the past  . Hypertension   . AV block, 1st degree   . CVD (cerebrovascular disease)   . Osteoarthritis   . Hypothyroidism   . Hypokalemia   . Diarrhea   . Atrial fibrillation     not a candidate for coumadin due to falls  . Pneumonia 09/11  . Traumatic enucleation of eye     left eye  . History of echocardiogram 9/11    normal EF, mild to mod MR & TR, mod pulmonary HTN  . Varicose veins     Past  Surgical History  Procedure Date  . Back surgery   . Inguinal hernia repair     left sided    History  Smoking status  . Never Smoker   Smokeless tobacco  . Not on file    History  Alcohol Use No    Family History  Problem Relation Age of Onset  . Heart attack Father   . Heart attack Brother   . Cancer Brother   . Stroke Brother     Review of Systems: The review of systems is positive for right leg pain. No chest pain. Has had worsening DOE that resolves with rest.  Weight is up 5 pounds since July. She does admit to eating more. All other systems were reviewed and are negative.  Physical Exam: BP 128/62  Pulse 78  Ht 5\' 2"  (1.575 m)  Wt 115 lb (52.164 kg)  BMI 21.03 kg/m2 Patient is very pleasant and in no acute distress. She looks younger than her stated age. Skin is warm and dry. Color is normal.  HEENT is unremarkable. Normocephalic/atraumatic. PERRL. Sclera are nonicteric. Neck is supple. No masses. No JVD. Lungs  are clear. Cardiac exam shows an irregular rhythm. Rate is controlled. Abdomen is soft. Extremities are with trace edema. Negative Homan's on the right. Multiple varicosities noted. Gait and ROM are intact. She is a little unsteady. No gross neurologic deficits noted.  LABORATORY DATA:   Assessment / Plan:

## 2011-06-06 NOTE — Assessment & Plan Note (Signed)
She notes more dyspnea of exertion and has had some more swelling. She has already increased her Lasix. We will check a BNP today. She may need further adjustment of her medicine.

## 2011-06-06 NOTE — Assessment & Plan Note (Signed)
Negative Homan's. No significant swelling but does have multiple varicosities. She wants to hold off on an ultrasound. When we call with her lab results next week, we will reassess. If her pain/discomfort continues, we will check a venous doppler. Patient is agreeable to this plan and will call if any problems develop in the interim.

## 2011-06-06 NOTE — Assessment & Plan Note (Signed)
She uses extra Atenolol prn. Blood pressure looks good here today. Will continue to monitor.

## 2011-06-09 ENCOUNTER — Telehealth: Payer: Self-pay | Admitting: *Deleted

## 2011-06-09 DIAGNOSIS — M79604 Pain in right leg: Secondary | ICD-10-CM

## 2011-06-09 NOTE — Telephone Encounter (Signed)
Message copied by Antony Odea on Mon Jun 09, 2011 10:33 AM ------      Message from: Rosalio Macadamia      Created: Mon Jun 09, 2011  7:52 AM       Ok to report. Labs are satisfactory. Fluid level (BNP) looks ok. Stay with same medicines. Please ask if her leg is still bothering her. If it is, need to check venous doppler. Patient of Dr. Harvie Bridge.

## 2011-06-09 NOTE — Telephone Encounter (Signed)
Leg pain continues, order placed, pt aware we will call her back with date and time.

## 2011-06-11 ENCOUNTER — Encounter (INDEPENDENT_AMBULATORY_CARE_PROVIDER_SITE_OTHER): Payer: Medicare Other | Admitting: Cardiology

## 2011-06-11 DIAGNOSIS — M79604 Pain in right leg: Secondary | ICD-10-CM

## 2011-06-11 DIAGNOSIS — M79609 Pain in unspecified limb: Secondary | ICD-10-CM

## 2011-06-11 DIAGNOSIS — R0602 Shortness of breath: Secondary | ICD-10-CM

## 2011-06-16 ENCOUNTER — Telehealth: Payer: Self-pay | Admitting: *Deleted

## 2011-06-16 NOTE — Telephone Encounter (Signed)
LEFT MESSAGE WITH SON. I REPORTED HER VENOUS DUPLEX WAS OK, AND THERE WAS NO SIGN OF A BLOOD CLOT IN HER LEG

## 2011-06-16 NOTE — Telephone Encounter (Signed)
Message copied by Royanne Foots on Mon Jun 16, 2011 11:14 AM ------      Message from: Rosalio Macadamia      Created: Mon Jun 16, 2011 11:11 AM       Ok to report. No evidence of blood clot in the leg.

## 2011-09-05 ENCOUNTER — Ambulatory Visit: Payer: Medicare Other | Admitting: Nurse Practitioner

## 2011-09-10 ENCOUNTER — Ambulatory Visit: Payer: Medicare Other | Admitting: Nurse Practitioner

## 2011-09-17 ENCOUNTER — Ambulatory Visit (INDEPENDENT_AMBULATORY_CARE_PROVIDER_SITE_OTHER): Payer: Medicare Other | Admitting: Nurse Practitioner

## 2011-09-17 ENCOUNTER — Encounter: Payer: Self-pay | Admitting: Nurse Practitioner

## 2011-09-17 VITALS — BP 118/68 | HR 78 | Ht 62.0 in | Wt 116.6 lb

## 2011-09-17 DIAGNOSIS — I4891 Unspecified atrial fibrillation: Secondary | ICD-10-CM

## 2011-09-17 DIAGNOSIS — R609 Edema, unspecified: Secondary | ICD-10-CM

## 2011-09-17 DIAGNOSIS — R55 Syncope and collapse: Secondary | ICD-10-CM

## 2011-09-17 DIAGNOSIS — I10 Essential (primary) hypertension: Secondary | ICD-10-CM

## 2011-09-17 NOTE — Patient Instructions (Signed)
I think you look good and are doing well.  Stay on your current medicines  Stay safe and be careful.  We will see you back in 6 months.  Call the [facility] office at [phone] if you have any questions, problems or concerns.

## 2011-09-17 NOTE — Progress Notes (Signed)
[name] Date of Birth: 1914/06/24 Medical Record #[id number]  History of Present Illness: [name] is seen today for a 3 month check. She is seen for Dr. Elease Hashimoto. She is here with her daughter. She is 78 years old. She has a remote history of syncope with a past loop recorder that was unremarkable. She has not had recurrence. Other problems include PAF, HTN, cerebrovascular disease, OA, hypothyroidism and advanced age. She is managed medically. Not felt to be a candidate for coumadin because of age and unsteady gait.  She comes in today. She is doing well. She is hard of hearing. No chest pain. Not short of breath. Has not had a recent fall but remains very unsteady. Will get a little dizzy every now and then but tries to stay active. Still cooking every day. She has had less swelling of her legs and would like to cut back her Lasix. She has had recent labs with her PCP.   Current Outpatient Prescriptions on File Prior to Visit  Medication Sig Dispense Refill  . aspirin 81 MG tablet Take 81 mg by mouth daily.        Marland Kitchen atenolol (TENORMIN) 25 MG tablet Take 0.5 tablets (12.5 mg total) by mouth daily.  30 tablet  4  . Cholecalciferol (VITAMIN D3) 1000 UNITS CAPS Take by mouth daily.      . fish oil-omega-3 fatty acids 1000 MG capsule Take 2 g by mouth daily.      . Flaxseed, Linseed, (FLAX SEED OIL PO) Take 2 tablets by mouth daily.      . furosemide (LASIX) 40 MG tablet TAKE 1 TABLET EVERY DAY  30 tablet  6  . levothyroxine (SYNTHROID, LEVOTHROID) 75 MCG tablet Take 75 mcg by mouth daily.        . Multiple Vitamins-Minerals (OCUVITE PRESERVISION PO) Take by mouth 2 (two) times daily.          Allergies  Allergen Reactions  . Ciprocin-Fluocin-Procin (Fluocinolone Acetonide)   . Codeine   . Sulfa Drugs Cross Reactors     Past Medical History  Diagnosis Date  . Syncope and collapse     Negative loop recorder in the past  . Hypertension   . AV block, 1st degree   . CVD  (cerebrovascular disease)   . Osteoarthritis   . Hypothyroidism   . Hypokalemia   . Diarrhea   . Atrial fibrillation     not a candidate for coumadin due to falls  . Pneumonia 09/11  . Traumatic enucleation of eye     left eye  . History of echocardiogram 9/11    normal EF, mild to mod MR & TR, mod pulmonary HTN  . Varicose veins   . Advanced age     Past Surgical History  Procedure Date  . Back surgery   . Inguinal hernia repair     left sided    History  Smoking status  . Never Smoker   Smokeless tobacco  . Not on file    History  Alcohol Use No    Family History  Problem Relation Age of Onset  . Heart attack Father   . Heart attack Brother   . Cancer Brother   . Stroke Brother     Review of Systems: The review of systems is positive for decreased hearing.  All other systems were reviewed and are negative.  Physical Exam: BP 118/68  Pulse 78  Ht 5\' 2"  (1.575 m)  Wt 116  lb 9.6 oz (52.889 kg)  BMI 21.33 kg/m2 Patient is very pleasant and in no acute distress. She is thin. Skin is warm and dry. Color is normal.  HEENT is unremarkable except for a facial deformity. Normocephalic/atraumatic. PERRL. Sclera are nonicteric. Neck is supple. No masses. No JVD. Lungs are clear. Cardiac exam shows an irregular rhythm.Her rate is controlled. Abdomen is soft. Extremities are without edema. She has her support stockings in place. Gait and ROM are intact. No gross neurologic deficits noted.  LABORATORY DATA: N/A  Assessment / Plan:

## 2011-09-17 NOTE — Assessment & Plan Note (Signed)
Swelling is controlled well with support stockings and low dose Lasix. Has had recent labs with her PCP. She may cut the Lasix back and just use 1/2 to a whole tablet daily. We will see her back in 6 months. Patient is agreeable to this plan and will call if any problems develop in the interim.

## 2011-09-17 NOTE — Assessment & Plan Note (Signed)
Rate is controlled. Not a candidate for coumadin.

## 2011-09-17 NOTE — Assessment & Plan Note (Signed)
Blood pressure has been good at home. She will rarely use an extra atenolol if her blood pressure is over 160 for two nights in a row. She will continue with her current regimen.

## 2011-09-17 NOTE — Assessment & Plan Note (Signed)
No recurrence in many years.

## 2011-11-12 ENCOUNTER — Other Ambulatory Visit: Payer: Self-pay | Admitting: Cardiology

## 2011-11-17 ENCOUNTER — Other Ambulatory Visit: Payer: Self-pay | Admitting: Nurse Practitioner

## 2012-02-25 ENCOUNTER — Encounter: Payer: Self-pay | Admitting: Cardiology

## 2012-02-26 ENCOUNTER — Encounter: Payer: Self-pay | Admitting: Cardiology

## 2012-03-19 ENCOUNTER — Ambulatory Visit: Payer: Medicare Other | Admitting: Nurse Practitioner

## 2012-03-23 ENCOUNTER — Encounter: Payer: Self-pay | Admitting: Nurse Practitioner

## 2012-03-23 ENCOUNTER — Ambulatory Visit (INDEPENDENT_AMBULATORY_CARE_PROVIDER_SITE_OTHER): Payer: Medicare Other | Admitting: Nurse Practitioner

## 2012-03-23 VITALS — BP 140/66 | HR 64 | Ht 61.5 in | Wt 118.8 lb

## 2012-03-23 DIAGNOSIS — I10 Essential (primary) hypertension: Secondary | ICD-10-CM

## 2012-03-23 NOTE — Patient Instructions (Signed)
I think you are doing well.  I will see you in 6 months.  Stay active and stay safe!  Call the [facility] office at [phone] if you have any questions, problems or concerns.

## 2012-03-23 NOTE — Progress Notes (Signed)
[name] Date of Birth: Sep 06, 1914 Medical Record #[id number]  History of Present Illness: [name] is seen back today for a 6 month check. She is seen for Dr. Elease Hashimoto. She has a remote history of syncope with a past loop recorder that turned out to be unremarkable. She has not had recurrence. Other issues include PAF, HTN, cerebrovascular disease, OA, hypothyroidism and advanced age. She has been managed medically. She is not felt to be a candidate for coumadin due to age and unsteady gait. She has chronic back pain.   She comes in today. She is here with her daughter.  She continues to do pretty well. She remains unsteady but no recent falls. Blood pressure has been doing fairly well for the most part. She will rarely take an extra half of her atenolol. She is back on some statin from her PCP. She is still cooking and doing ironing. No chest pain. Not short of breath. Remains more limited by her back. She feels like her rhythm has been ok. No syncope.   Current Outpatient Prescriptions on File Prior to Visit  Medication Sig Dispense Refill  . aspirin 81 MG tablet Take 81 mg by mouth daily.        Marland Kitchen atenolol (TENORMIN) 25 MG tablet TAKE 0.5 TABLETS (12.5 MG TOTAL) BY MOUTH DAILY.  30 tablet  4  . Cholecalciferol (VITAMIN D3) 1000 UNITS CAPS Take by mouth daily.      . fish oil-omega-3 fatty acids 1000 MG capsule Take 2 g by mouth daily.      . Flaxseed, Linseed, (FLAX SEED OIL PO) Take 2 tablets by mouth daily.      . furosemide (LASIX) 40 MG tablet TAKE 1 TABLET EVERY DAY  30 tablet  6  . levothyroxine (SYNTHROID, LEVOTHROID) 75 MCG tablet Take 75 mcg by mouth daily.        . Multiple Vitamins-Minerals (OCUVITE PRESERVISION PO) Take by mouth 2 (two) times daily.          Allergies  Allergen Reactions  . Ciprocin-Fluocin-Procin (Fluocinolone Acetonide)   . Codeine   . Sulfa Drugs Cross Reactors     Past Medical History  Diagnosis Date  . Syncope and collapse     Negative loop  recorder in the past  . Hypertension   . AV block, 1st degree   . CVD (cerebrovascular disease)   . Osteoarthritis   . Hypothyroidism   . Hypokalemia   . Diarrhea   . Atrial fibrillation     not a candidate for coumadin due to falls  . Pneumonia 09/11  . Traumatic enucleation of eye     left eye  . History of echocardiogram 9/11    normal EF, mild to mod MR & TR, mod pulmonary HTN  . Varicose veins   . Advanced age     Past Surgical History  Procedure Date  . Back surgery   . Inguinal hernia repair     left sided    History  Smoking status  . Never Smoker   Smokeless tobacco  . Not on file    History  Alcohol Use No    Family History  Problem Relation Age of Onset  . Heart attack Father   . Heart attack Brother   . Cancer Brother   . Stroke Brother     Review of Systems: The review of systems is per the HPI.  All other systems were reviewed and are negative.  Physical Exam: There  were no vitals taken for this visit. Patient is very pleasant and in no acute distress. She looks younger than her stated age. Skin is warm and dry. Color is normal.  HEENT is unremarkable. Normocephalic/atraumatic. PERRL. Sclera are nonicteric. Neck is supple. No masses. No JVD. Lungs are clear. Cardiac exam shows a regular rate and rhythm. Abdomen is soft. Extremities are without edema. Gait and ROM are intact. No gross neurologic deficits noted.  LABORATORY DATA:  Lab Results  Component Value Date   WBC 9.6 06/06/2011   HGB 12.6 06/06/2011   HCT 37.7 06/06/2011   PLT 304 06/06/2011   GLUCOSE 85 06/06/2011   ALT 47* 02/06/2011   AST 28 02/06/2011   NA 139 06/06/2011   K 3.8 06/06/2011   CL 103 06/06/2011   CREATININE 1.15* 06/06/2011   BUN 26* 06/06/2011   CO2 26 06/06/2011   TSH 1.024 02/04/2010   INR 0.94 02/06/2011   Assessment / Plan:  1. HTN - blood pressure looks good. She will stay on her current regimen.   2. PAF - not a candidate for coumadin. She appears to be in sinus  on exam today.  3. Advanced age - she is doing quite well overall. I will see her back in 6 months. Patient is agreeable to this plan and will call if any problems develop in the interim.

## 2012-04-13 ENCOUNTER — Other Ambulatory Visit (HOSPITAL_COMMUNITY): Payer: Self-pay | Admitting: Internal Medicine

## 2012-04-13 DIAGNOSIS — R109 Unspecified abdominal pain: Secondary | ICD-10-CM

## 2012-04-14 ENCOUNTER — Other Ambulatory Visit (HOSPITAL_COMMUNITY): Payer: Self-pay | Admitting: Internal Medicine

## 2012-04-14 ENCOUNTER — Ambulatory Visit (HOSPITAL_COMMUNITY)
Admission: RE | Admit: 2012-04-14 | Discharge: 2012-04-14 | Disposition: A | Discharge status: Home / Self Care | Payer: Medicare Other | Source: Ambulatory Visit | Attending: Internal Medicine | Admitting: Internal Medicine

## 2012-04-14 DIAGNOSIS — I700 Atherosclerosis of aorta: Secondary | ICD-10-CM | POA: Insufficient documentation

## 2012-04-14 DIAGNOSIS — R109 Unspecified abdominal pain: Secondary | ICD-10-CM

## 2012-04-14 DIAGNOSIS — K4090 Unilateral inguinal hernia, without obstruction or gangrene, not specified as recurrent: Secondary | ICD-10-CM | POA: Insufficient documentation

## 2012-04-14 DIAGNOSIS — K5730 Diverticulosis of large intestine without perforation or abscess without bleeding: Secondary | ICD-10-CM | POA: Insufficient documentation

## 2012-04-14 DIAGNOSIS — R1031 Right lower quadrant pain: Secondary | ICD-10-CM | POA: Insufficient documentation

## 2012-04-16 ENCOUNTER — Encounter (INDEPENDENT_AMBULATORY_CARE_PROVIDER_SITE_OTHER): Payer: Self-pay | Admitting: General Surgery

## 2012-04-20 ENCOUNTER — Ambulatory Visit (INDEPENDENT_AMBULATORY_CARE_PROVIDER_SITE_OTHER): Payer: Medicare Other | Admitting: General Surgery

## 2012-04-20 VITALS — BP 130/60 | HR 80 | Temp 98.2°F | Resp 18 | Ht 62.0 in | Wt 115.0 lb

## 2012-04-20 DIAGNOSIS — K4090 Unilateral inguinal hernia, without obstruction or gangrene, not specified as recurrent: Secondary | ICD-10-CM

## 2012-04-20 NOTE — Progress Notes (Signed)
Subjective:     Patient ID: [name], female   DOB: 14-Mar-1915, 78 y.o.   MRN: [id number]  HPI The patient is a 78 year old female with a several year history of a right inguinal hernia. Patient states that the area in the right inguinal area is consistent with a bulge and has pain at times. The patient states that there are minimal things she can do to relieve the pain aside from lying down.  The patient complains of no symptoms or signs of incarceration or obstruction.  Review of Systems  Gastrointestinal: Positive for abdominal pain.  All other systems reviewed and are negative.       Objective:   Physical Exam  Constitutional: She appears well-developed and well-nourished.  HENT:  Head: Normocephalic and atraumatic.  Eyes: Conjunctivae normal and EOM are normal. Pupils are equal, round, and reactive to light.  Neck: Normal range of motion. Neck supple.  Cardiovascular: Normal rate, regular rhythm and normal heart sounds.   Pulmonary/Chest: Effort normal and breath sounds normal.  Abdominal: Soft. A hernia is present. Hernia confirmed positive in the right inguinal area. Hernia confirmed negative in the left inguinal area.  Musculoskeletal: Normal range of motion.  Neurological: She is alert. She has normal reflexes.       Assessment:     78 year old female with a right inguinal hernia    Plan:     1.. I will prescribe a truss for the patient to help with supporting her right ankle hernia.  2. I explained to the patient as well as her son and daughter the risk of the patient the operating room secondary to her age and her cardiac history. They both agree that operating the operating room would be a good idea at this time. I explained the signs and symptoms of possible incarceration please go to the operating room and all parties involved understood.

## 2012-05-06 ENCOUNTER — Other Ambulatory Visit: Payer: Self-pay | Admitting: Nurse Practitioner

## 2012-05-06 NOTE — Telephone Encounter (Signed)
New Problem:    Patient's daughter called in needing a refill of her mother's atenolol (TENORMIN) 25 MG tablet filled for 1 tablet daily because her prescription runs out early.  Please call back.

## 2012-05-06 NOTE — Telephone Encounter (Signed)
tw pt son and has two refills left on the atenolol bottle so did not a refill sent in

## 2012-05-07 NOTE — Telephone Encounter (Signed)
tw pt son decided didn't need refills had some on bottle

## 2012-05-07 NOTE — Telephone Encounter (Signed)
tw pt son decided didn't need refills had some on bottle 

## 2012-05-24 ENCOUNTER — Encounter (HOSPITAL_COMMUNITY): Payer: Self-pay | Admitting: General Practice

## 2012-05-24 ENCOUNTER — Encounter: Payer: Self-pay | Admitting: Nurse Practitioner

## 2012-05-24 ENCOUNTER — Inpatient Hospital Stay (HOSPITAL_COMMUNITY)
Admission: EM | Admit: 2012-05-24 | Discharge: 2012-05-25 | DRG: 312 | Disposition: A | Discharge status: Home / Self Care | Payer: Medicare Other | Attending: Internal Medicine | Admitting: Internal Medicine

## 2012-05-24 ENCOUNTER — Emergency Department (HOSPITAL_COMMUNITY): Payer: Medicare Other

## 2012-05-24 DIAGNOSIS — N179 Acute kidney failure, unspecified: Secondary | ICD-10-CM | POA: Diagnosis present

## 2012-05-24 DIAGNOSIS — E860 Dehydration: Secondary | ICD-10-CM | POA: Diagnosis present

## 2012-05-24 DIAGNOSIS — M79604 Pain in right leg: Secondary | ICD-10-CM

## 2012-05-24 DIAGNOSIS — E039 Hypothyroidism, unspecified: Secondary | ICD-10-CM | POA: Diagnosis present

## 2012-05-24 DIAGNOSIS — I679 Cerebrovascular disease, unspecified: Secondary | ICD-10-CM | POA: Diagnosis present

## 2012-05-24 DIAGNOSIS — I10 Essential (primary) hypertension: Secondary | ICD-10-CM | POA: Diagnosis present

## 2012-05-24 DIAGNOSIS — S0570XA Avulsion of unspecified eye, initial encounter: Secondary | ICD-10-CM | POA: Diagnosis present

## 2012-05-24 DIAGNOSIS — Z8701 Personal history of pneumonia (recurrent): Secondary | ICD-10-CM

## 2012-05-24 DIAGNOSIS — I4891 Unspecified atrial fibrillation: Secondary | ICD-10-CM | POA: Diagnosis present

## 2012-05-24 DIAGNOSIS — R55 Syncope and collapse: Principal | ICD-10-CM

## 2012-05-24 DIAGNOSIS — E876 Hypokalemia: Secondary | ICD-10-CM | POA: Diagnosis present

## 2012-05-24 DIAGNOSIS — X58XXXA Exposure to other specified factors, initial encounter: Secondary | ICD-10-CM | POA: Diagnosis present

## 2012-05-24 DIAGNOSIS — M1990 Unspecified osteoarthritis, unspecified site: Secondary | ICD-10-CM | POA: Diagnosis present

## 2012-05-24 DIAGNOSIS — I8390 Asymptomatic varicose veins of unspecified lower extremity: Secondary | ICD-10-CM | POA: Diagnosis present

## 2012-05-24 DIAGNOSIS — I440 Atrioventricular block, first degree: Secondary | ICD-10-CM | POA: Diagnosis present

## 2012-05-24 DIAGNOSIS — R197 Diarrhea, unspecified: Secondary | ICD-10-CM

## 2012-05-24 DIAGNOSIS — I495 Sick sinus syndrome: Secondary | ICD-10-CM | POA: Diagnosis present

## 2012-05-24 DIAGNOSIS — J189 Pneumonia, unspecified organism: Secondary | ICD-10-CM

## 2012-05-24 DIAGNOSIS — R609 Edema, unspecified: Secondary | ICD-10-CM

## 2012-05-24 HISTORY — DX: Gastro-esophageal reflux disease without esophagitis: K21.9

## 2012-05-24 LAB — URINALYSIS, ROUTINE W REFLEX MICROSCOPIC
Bilirubin Urine: NEGATIVE
Glucose, UA: NEGATIVE mg/dL
Hgb urine dipstick: NEGATIVE
Specific Gravity, Urine: 1.008 (ref 1.005–1.030)
pH: 6.5 (ref 5.0–8.0)

## 2012-05-24 LAB — CBC WITH DIFFERENTIAL/PLATELET
Basophils Absolute: 0.1 10*3/uL (ref 0.0–0.1)
Basophils Relative: 1 % (ref 0–1)
Hemoglobin: 12.1 g/dL (ref 12.0–15.0)
MCHC: 33.4 g/dL (ref 30.0–36.0)
Monocytes Relative: 15 % — ABNORMAL HIGH (ref 3–12)
Neutro Abs: 4.2 10*3/uL (ref 1.7–7.7)
Neutrophils Relative %: 51 % (ref 43–77)
WBC: 8.2 10*3/uL (ref 4.0–10.5)

## 2012-05-24 LAB — COMPREHENSIVE METABOLIC PANEL
AST: 30 U/L (ref 0–37)
Albumin: 3.5 g/dL (ref 3.5–5.2)
Alkaline Phosphatase: 54 U/L (ref 39–117)
BUN: 26 mg/dL — ABNORMAL HIGH (ref 6–23)
Chloride: 100 mEq/L (ref 96–112)
Potassium: 3.9 mEq/L (ref 3.5–5.1)
Total Bilirubin: 0.3 mg/dL (ref 0.3–1.2)

## 2012-05-24 LAB — URINE MICROSCOPIC-ADD ON

## 2012-05-24 LAB — POCT I-STAT TROPONIN I: Troponin i, poc: 0 ng/mL (ref 0.00–0.08)

## 2012-05-24 MED ORDER — SODIUM CHLORIDE 0.9 % IV SOLN
INTRAVENOUS | Status: AC
  Administered 2012-05-25: 02:00:00 via INTRAVENOUS

## 2012-05-24 MED ORDER — SODIUM CHLORIDE 0.9 % IV BOLUS (SEPSIS)
500.0000 mL | Freq: Once | INTRAVENOUS | Status: AC
  Administered 2012-05-24: 500 mL via INTRAVENOUS

## 2012-05-24 NOTE — ED Notes (Signed)
Ems reports that patient also had about 5 minutes of diarrhea after the episode

## 2012-05-24 NOTE — ED Notes (Signed)
Pt was at home in kitchen and son reported to ems that patient had an unresponsive episode and was diaphoretic.  Ems reported BP 60/40 and BS 140. On arrival patient had two short pauses on monitor.  Pt just feels tired and denies pain

## 2012-05-24 NOTE — ED Notes (Signed)
Pt unable to provide urine sample at this time 

## 2012-05-24 NOTE — ED Notes (Signed)
Admitting physician at bedside

## 2012-05-24 NOTE — ED Provider Notes (Addendum)
History     CSN: [id number]  Arrival date & time 05/24/12  4696   First MD Initiated Contact with Patient 05/24/12 1855      Chief Complaint  Patient presents with  . Loss of Consciousness    (Consider location/radiation/quality/duration/timing/severity/associated sxs/prior treatment) Patient is a 78 y.o. female presenting with syncope. The history is provided by the patient and a caregiver.  Loss of Consciousness This is a new problem. The current episode started less than 1 hour ago. The problem occurs constantly. The problem has been resolved. Pertinent negatives include no chest pain and no shortness of breath. Associated symptoms comments: States she was cleaning in the kitchen and stared feeling weak and that is all she remembers.  Family states she sat down and her head started to slouch and she became unresponsive.. Nothing aggravates the symptoms. The symptoms are relieved by rest. She has tried rest for the symptoms. The treatment provided significant relief.    Past Medical History  Diagnosis Date  . Syncope and collapse     Negative loop recorder in the past  . Hypertension   . AV block, 1st degree   . CVD (cerebrovascular disease)   . Osteoarthritis   . Hypothyroidism   . Hypokalemia   . Diarrhea   . Atrial fibrillation     not a candidate for coumadin due to falls  . Pneumonia 09/11  . Traumatic enucleation of eye     left eye  . History of echocardiogram 9/11    normal EF, mild to mod MR & TR, mod pulmonary HTN  . Varicose veins   . Advanced age     Past Surgical History  Procedure Date  . Back surgery   . Inguinal hernia repair     left sided    Family History  Problem Relation Age of Onset  . Heart attack Father   . Heart attack Brother   . Cancer Brother   . Stroke Brother     History  Substance Use Topics  . Smoking status: Never Smoker   . Smokeless tobacco: Not on file  . Alcohol Use: No    OB History    Grav Para Term Preterm  Abortions TAB SAB Ect Mult Living                  Review of Systems  Constitutional: Negative for fever.  Respiratory: Negative for shortness of breath.   Cardiovascular: Positive for syncope. Negative for chest pain.  All other systems reviewed and are negative.    Allergies  Ciprocin-fluocin-procin; Codeine; and Sulfa drugs cross reactors  Home Medications   Current Outpatient Rx  Name  Route  Sig  Dispense  Refill  . ASPIRIN 81 MG PO TABS   Oral   Take 81 mg by mouth at bedtime.          . ATENOLOL 25 MG PO TABS   Oral   Take 12.5 mg by mouth daily.         Marland Kitchen VITAMIN D3 1000 UNITS PO CAPS   Oral   Take 1 capsule by mouth daily.          Marland Kitchen FLAX SEED OIL PO   Oral   Take 1 tablet by mouth 2 (two) times daily.          . FUROSEMIDE 40 MG PO TABS   Oral   Take 40 mg by mouth daily.         Marland Kitchen  LEVOTHYROXINE SODIUM 75 MCG PO TABS   Oral   Take 75 mcg by mouth daily.           Idolina Primer PRESERVISION PO   Oral   Take 1 tablet by mouth 2 (two) times daily.          Marland Kitchen PRAVASTATIN SODIUM 40 MG PO TABS   Oral   Take 40 mg by mouth at bedtime.         Marland Kitchen PRESCRIPTION MEDICATION   Oral   Take 1 capsule by mouth at bedtime. Medication:Lyrica (dose unknown)         . PROMETHAZINE HCL 25 MG PO TABS   Oral   Take 25 mg by mouth every 6 (six) hours as needed. For nausea         . TRAMADOL HCL 50 MG PO TABS   Oral   Take 50 mg by mouth every 6 (six) hours as needed. For pain           BP 146/68  Pulse 74  Resp 14  SpO2 100%  Physical Exam  Nursing note and vitals reviewed. Constitutional: She is oriented to person, place, and time. She appears well-developed and well-nourished. No distress.  HENT:  Head: Normocephalic and atraumatic.  Mouth/Throat: Oropharynx is clear and moist.  Eyes: Conjunctivae normal and EOM are normal. Pupils are equal, round, and reactive to light.       Enucleated left eye  Neck: Normal range of motion. Neck  supple.  Cardiovascular: Normal rate, regular rhythm and intact distal pulses.   No murmur heard. Pulmonary/Chest: Effort normal and breath sounds normal. No respiratory distress. She has no wheezes. She has no rales.  Abdominal: Soft. She exhibits no distension. There is no tenderness. There is no rebound and no guarding.  Musculoskeletal: Normal range of motion. She exhibits no edema and no tenderness.  Neurological: She is alert and oriented to person, place, and time.  Skin: Skin is warm and dry. No rash noted. No erythema.  Psychiatric: She has a normal mood and affect. Her behavior is normal.    ED Course  Procedures (including critical care time)  Labs Reviewed  CBC WITH DIFFERENTIAL - Abnormal; Notable for the following:    RBC 3.85 (*)     Monocytes Relative 15 (*)     Monocytes Absolute 1.2 (*)     Eosinophils Relative 7 (*)     All other components within normal limits  COMPREHENSIVE METABOLIC PANEL - Abnormal; Notable for the following:    Glucose, Bld 134 (*)     BUN 26 (*)     Creatinine, Ser 1.57 (*)     GFR calc non Af Amer 27 (*)     GFR calc Af Amer 31 (*)     All other components within normal limits  POCT I-STAT TROPONIN I  URINALYSIS, ROUTINE W REFLEX MICROSCOPIC   Dg Chest Port 1 View  05/24/2012  *RADIOLOGY REPORT*  Clinical Data: Syncope  PORTABLE CHEST - 1 VIEW  Comparison: Prior chest x-ray 02/06/2011  Findings: Similar appearance of the lungs.  Right greater than left biapical pleural parenchymal scarring diffuse coarsening of the interstitial markings.  No new focal airspace consolidation or pulmonary edema.  Unchanged cardiac and mediastinal contours. Aortic atherosclerosis.  Degenerative changes of the bilateral coracoclavicular joints.  The bones are diffusely osteopenic.  IMPRESSION: No acute cardiopulmonary disease   Original Report Authenticated By: Malachy Moan, M.D.     Date: 05/24/2012  Rate: 68  Rhythm: normal sinus rhythm  QRS Axis:  normal  Intervals: normal  ST/T Wave abnormalities: normal  Conduction Disutrbances:first-degree A-V block   Narrative Interpretation:   Old EKG Reviewed: unchanged    1. Syncope       MDM   Patient with an episode of syncope tonight but given her history could be from orthostatic hypotension versus arrhythmia. Family states she was cleaning and cooking at home and may have bent over. Patient states she just started feeling weak and sat down. Apparent loss of consciousness for approximately 4 minutes. When EMS arrived she was hypotensive however here she is normotensive and has no complaints. However he EMS did have several long pauses on the monitor.  EKG without acute finding except for a first-degree A-V block. No acute changes. Labs and chest x-ray are within normal limits. Cardiac enzymes are negative. Patient will be admitted for syncope workup.        Gwyneth Sprout, MD 05/24/12 7846  Gwyneth Sprout, MD 05/24/12 2158

## 2012-05-24 NOTE — ED Notes (Signed)
Pt undressed, in gown, on monitor, continuous pulse oximetry and blood pressure cuff; EKG performed; family at bedside 

## 2012-05-24 NOTE — H&P (Signed)
[facility] ADMISSION NOTE  Hospital Admission Note Date: 05/24/2012  Patient name:  [name]  Medical record number:  [id number] Date of birth:  10/06/1914  Age: 78 y.o. Gender:  female PCP:    Nadean Corwin, MD  Medical Service: Internal Medicine   Chief Complaint: syncope  History of Present Illness: Patient is a 78 y.o. female with a PMHx of hypothyroidism, atrial fibrillation, first degree AV block presents to the ER with chief complaint of syncope. Patient was apparently doing well and was cooking in the kitchen and suddenly she felt weak and sat in a chair. She lost consciousness and stopped responding for about 30 seconds to a minute and then regained complete consciousness. Patient did not have any seizure, headaches, weakness, numbness, alteration in speech or blurred vision. EMS was called who found her blood pressure to be 60s over 40. Rhythm strip done well transportation reviewed a sinus pause of about 2.5 seconds.   The patient is fairly active otherwise and did 3 loads of laundry today. Patient has had previous episodes of syncope and falls and has had an implanted loop recorder for about 1-1/2 years which did not show any pauses about 5 years ago. Patient is seen by [facility] cardiology.   Review of system is otherwise negative except some swelling in the feet by the end of the day.  Current Outpatient Medications: Current Facility-Administered Medications  Medication Dose Route Frequency Provider Last Rate Last Dose  . 0.9 %  sodium chloride infusion   Intravenous STAT Gwyneth Sprout, MD        Allergies: Ciprocin-fluocin-procin; Codeine; and Sulfa drugs cross reactors  Past Medical History: Past Medical History  Diagnosis Date  . Syncope and collapse     Negative loop recorder in the past  . Hypertension   . AV block, 1st degree   . CVD (cerebrovascular disease)   . Osteoarthritis   . Hypothyroidism   . Hypokalemia   . Diarrhea   . Atrial  fibrillation     not a candidate for coumadin due to falls  . Pneumonia 09/11  . Traumatic enucleation of eye     left eye  . History of echocardiogram 9/11    normal EF, mild to mod MR & TR, mod pulmonary HTN  . Varicose veins   . Advanced age     Past Surgical History: Past Surgical History  Procedure Date  . Back surgery   . Inguinal hernia repair     left sided    Family History: Family History  Problem Relation Age of Onset  . Heart attack Father   . Heart attack Brother   . Cancer Brother   . Stroke Brother     Social History: History   Social History  . Marital Status: Single    Spouse Name: N/A    Number of Children: N/A  . Years of Education: N/A   Occupational History  . Not on file.   Social History Main Topics  . Smoking status: Never Smoker   . Smokeless tobacco: Not on file  . Alcohol Use: No  . Drug Use: No  . Sexually Active: No   Other Topics Concern  . Not on file   Social History Narrative  . No narrative on file    Review of Systems: Pertinent items are noted in HPI.  Vital Signs: T: 97.9 P: 68 BP: 146/68 RR: 19 O2 sat: 99% on RA   Physical Exam: General: Vital signs reviewed  and noted. Well-developed,  in no acute distress; alert, appropriate and cooperative throughout examination.  Head: Normocephalic, atraumatic.  Eyes: PERRL, EOMI, No signs of anemia or jaundince.  Nose: Mucous membranes moist, not inflammed, nonerythematous.  Throat: Oropharynx nonerythematous, no exudate appreciated, dry mucous membrane.   Neck: No deformities, masses, or tenderness noted.Supple, No carotid Bruits, no JVD.  Lungs:  Normal respiratory effort. Clear to auscultation BL without crackles or wheezes.  Heart: RRR. S1 and S2 normal without gallop, murmur, or rubs.  Abdomen:  BS normoactive. Soft, Nondistended, non-tender.  No masses or organomegaly.  Extremities: No pretibial edema.  Neurologic: A&O X3, CN II - XII are grossly intact. Motor  strength is 5/5 in the all 4 extremities, Sensations intact to light touch, Cerebellar signs negative.  Skin:  patient had friable skin and several places of petechial rashes    Lab results: CBC:    Component Value Date/Time   WBC 8.2 05/24/2012 1905   HGB 12.1 05/24/2012 1905   HCT 36.2 05/24/2012 1905   PLT 228 05/24/2012 1905   MCV 94.0 05/24/2012 1905   NEUTROABS 4.2 05/24/2012 1905   LYMPHSABS 2.1 05/24/2012 1905   MONOABS 1.2* 05/24/2012 1905   EOSABS 0.6 05/24/2012 1905   BASOSABS 0.1 05/24/2012 1905      Comprehensive Metabolic Panel:    Component Value Date/Time   NA 137 05/24/2012 1905   K 3.9 05/24/2012 1905   CL 100 05/24/2012 1905   CO2 23 05/24/2012 1905   BUN 26* 05/24/2012 1905   CREATININE 1.57* 05/24/2012 1905   CREATININE 1.15* 06/06/2011 1506   GLUCOSE 134* 05/24/2012 1905   CALCIUM 9.3 05/24/2012 1905   AST 30 05/24/2012 1905   ALT 20 05/24/2012 1905   ALKPHOS 54 05/24/2012 1905   BILITOT 0.3 05/24/2012 1905   PROT 6.7 05/24/2012 1905   ALBUMIN 3.5 05/24/2012 1905     Lab Results  Component Value Date   CKTOTAL 133 02/04/2010   CKMB 1.7 02/04/2010   TROPONINI  Value: 0.01        NO INDICATION OF MYOCARDIAL INJURY. 02/04/2010      Imaging results:   CXR: Findings: Similar appearance of the lungs. Right greater than left biapical pleural parenchymal scarring diffuse coarsening of the interstitial markings. No new focal airspace consolidation or pulmonary edema. Unchanged cardiac and mediastinal contours. Aortic atherosclerosis. Degenerative changes of the bilateral coracoclavicular joints. The bones are diffusely osteopenic.  IMPRESSION:  No acute cardiopulmonary disease  EKG: sinus rhythm, HR~ 70 bpm,  first degree AV block( unchanged as compared to old EKG) , no acute ST- T wave changes  Assessment & Plan: Syncope: Patient presents with an episode of syncope tonight. Her EKG shows prolonged PR interval consistent with first degree AV block.  Patient also had a sinus pause of more than 2 seconds on rhythm strip done by EMS. Differential for her presentation include orthostatic hypotension vs vasovagal vs sick sinus syndrome. First set of cardiac enzymes have been negative. Neurological causes are less likely in the absence of any focal neurological deficits.  Admit to telemetry bed.  Cycle CE x3  Obtain 2 D echo IVF for 12 hours and then reassess [facility] cardiology was consulted: fellow on call refuses to see the patient tonight and agreed to pass on the consult to incoming physician in AM.  Acute kidney injury: Her baseline Cr 1.09-1.15. She presented with Cr of 1.57. Likely pre- renal 2/2 dehydration. IVF. Recheck BMET in AM.   Atrial  fibrillation: Rate controlled. Put atenolol on hold in the setting of syncope with sinus pause on rhythm strip by EMS.  Hypothyroidism: Last TSH is available from 09/11 which was WNL. Continue synthroid. Recheck TSH.  DVT PPX: Lovenox   Code: Full Family communication: Patient and family at the bedside was updated on the care plan  Lars Mage, M.D.  05/24/2012, 11:29 PM

## 2012-05-25 ENCOUNTER — Telehealth: Payer: Self-pay | Admitting: Nurse Practitioner

## 2012-05-25 ENCOUNTER — Encounter (HOSPITAL_COMMUNITY): Payer: Self-pay | Admitting: Physician Assistant

## 2012-05-25 DIAGNOSIS — I059 Rheumatic mitral valve disease, unspecified: Secondary | ICD-10-CM

## 2012-05-25 DIAGNOSIS — R609 Edema, unspecified: Secondary | ICD-10-CM

## 2012-05-25 DIAGNOSIS — I10 Essential (primary) hypertension: Secondary | ICD-10-CM

## 2012-05-25 DIAGNOSIS — I440 Atrioventricular block, first degree: Secondary | ICD-10-CM

## 2012-05-25 LAB — CBC
MCH: 32 pg (ref 26.0–34.0)
MCHC: 33.9 g/dL (ref 30.0–36.0)
MCV: 94.7 fL (ref 78.0–100.0)
Platelets: 229 10*3/uL (ref 150–400)
RBC: 3.37 MIL/uL — ABNORMAL LOW (ref 3.87–5.11)
RDW: 12.9 % (ref 11.5–15.5)

## 2012-05-25 LAB — TROPONIN I
Troponin I: <0.3 ng/mL (ref <0.30)
Troponin I: <0.3 ng/mL (ref <0.30)
Troponin I: <0.3 ng/mL (ref <0.30)

## 2012-05-25 LAB — BASIC METABOLIC PANEL
Calcium: 8.8 mg/dL (ref 8.4–10.5)
Creatinine, Ser: 1.25 mg/dL — ABNORMAL HIGH (ref 0.50–1.10)
GFR calc Af Amer: 41 mL/min — ABNORMAL LOW (ref >90)
GFR calc non Af Amer: 35 mL/min — ABNORMAL LOW (ref >90)
Sodium: 141 mEq/L (ref 135–145)

## 2012-05-25 LAB — MAGNESIUM: Magnesium: 2.1 mg/dL (ref 1.5–2.5)

## 2012-05-25 MED ORDER — VITAMIN D3 25 MCG (1000 UT) PO CAPS: 1.0000 | ORAL_CAPSULE | Freq: Every day | ORAL | Status: DC

## 2012-05-25 MED ORDER — PROMETHAZINE HCL 25 MG PO TABS: 25.0000 mg | ORAL_TABLET | Freq: Four times a day (QID) | ORAL | Status: DC | PRN

## 2012-05-25 MED ORDER — DOCUSATE SODIUM 100 MG PO CAPS
100.0000 mg | ORAL_CAPSULE | Freq: Two times a day (BID) | ORAL | Status: DC
  Administered 2012-05-25: 100 mg via ORAL
  Filled 2012-05-25 (×2): qty 1

## 2012-05-25 MED ORDER — SIMVASTATIN 20 MG PO TABS
20.0000 mg | ORAL_TABLET | Freq: Every day | ORAL | Status: DC
  Filled 2012-05-25: qty 1

## 2012-05-25 MED ORDER — ONDANSETRON HCL 4 MG PO TABS: 4.0000 mg | ORAL_TABLET | Freq: Four times a day (QID) | ORAL | Status: DC | PRN

## 2012-05-25 MED ORDER — FUROSEMIDE 40 MG PO TABS
40.0000 mg | ORAL_TABLET | Freq: Every day | ORAL | Status: DC
  Filled 2012-05-25: qty 1

## 2012-05-25 MED ORDER — SODIUM CHLORIDE 0.9 % IJ SOLN: 3.0000 mL | INTRAMUSCULAR | Status: DC | PRN

## 2012-05-25 MED ORDER — TRAMADOL HCL 50 MG PO TABS: 50.0000 mg | ORAL_TABLET | Freq: Four times a day (QID) | ORAL | Status: DC | PRN

## 2012-05-25 MED ORDER — ACETAMINOPHEN 325 MG PO TABS: 650.0000 mg | ORAL_TABLET | Freq: Four times a day (QID) | ORAL | Status: DC | PRN

## 2012-05-25 MED ORDER — ENOXAPARIN SODIUM 30 MG/0.3ML ~~LOC~~ SOLN
30.0000 mg | SUBCUTANEOUS | Status: DC
  Administered 2012-05-25: 30 mg via SUBCUTANEOUS
  Filled 2012-05-25: qty 0.3

## 2012-05-25 MED ORDER — SODIUM CHLORIDE 0.9 % IV SOLN: INTRAVENOUS | Status: DC

## 2012-05-25 MED ORDER — SODIUM CHLORIDE 0.9 % IV SOLN: 250.0000 mL | INTRAVENOUS | Status: DC | PRN

## 2012-05-25 MED ORDER — LEVOTHYROXINE SODIUM 75 MCG PO TABS
75.0000 ug | ORAL_TABLET | Freq: Every day | ORAL | Status: DC
  Administered 2012-05-25: 75 ug via ORAL
  Filled 2012-05-25 (×2): qty 1

## 2012-05-25 MED ORDER — ASPIRIN 81 MG PO TABS
81.0000 mg | ORAL_TABLET | Freq: Every day | ORAL | Status: DC
Start: 2012-05-25 — End: 2012-05-25

## 2012-05-25 MED ORDER — VITAMIN D3 25 MCG (1000 UNIT) PO TABS
1000.0000 [IU] | ORAL_TABLET | Freq: Every day | ORAL | Status: DC
  Administered 2012-05-25: 1000 [IU] via ORAL
  Filled 2012-05-25: qty 1

## 2012-05-25 MED ORDER — DSS 100 MG PO CAPS: 100.0000 mg | ORAL_CAPSULE | Freq: Two times a day (BID) | ORAL | Status: DC

## 2012-05-25 MED ORDER — ACETAMINOPHEN 650 MG RE SUPP: 650.0000 mg | Freq: Four times a day (QID) | RECTAL | Status: DC | PRN

## 2012-05-25 MED ORDER — ASPIRIN 81 MG PO CHEW
81.0000 mg | CHEWABLE_TABLET | Freq: Every day | ORAL | Status: DC
  Administered 2012-05-25: 81 mg via ORAL
  Filled 2012-05-25: qty 1

## 2012-05-25 MED ORDER — SODIUM CHLORIDE 0.9 % IJ SOLN
3.0000 mL | Freq: Two times a day (BID) | INTRAMUSCULAR | Status: DC
  Administered 2012-05-25 (×2): 3 mL via INTRAVENOUS

## 2012-05-25 MED ORDER — ONDANSETRON HCL 4 MG/2ML IJ SOLN: 4.0000 mg | Freq: Four times a day (QID) | INTRAMUSCULAR | Status: DC | PRN

## 2012-05-25 NOTE — Telephone Encounter (Signed)
Pt's dtr calling re pt passing out last night and went to ER , wants TO TALK WITH [name] @ [phone]

## 2012-05-25 NOTE — Consult Note (Signed)
Cardiology Consult Note   Patient ID: [name] MRN: [id number], DOB/AGE: 1915-03-01   Admit date: 05/24/2012 Date of Consult: 05/25/2012  Primary Physician: Nadean Corwin, MD Primary Cardiologist: Delane Ginger, MD  Reason for consult: evaluation of syncope  HPI: [name] is a 78yo female with PMHx s/f PAF (not on anticoag d/t falls), syncope (s/p negative loop recorder findings), hypothyroidism, CVD- hx of CVA and GERD who was admitted to [facility] on 05/24/12 with syncope.   She is fairly active. She cooks daily and does laundry. She has followed-up in the office regularly with Norma Fredrickson. Aside from depedent edema due to venous insufficiency and mild elevation in BP, she has been stable, maintaining NSR and without recurrent syncope. She has had a loop recorder implanted in the past which was unremarkable. She has a history of 1st degree AVB. Last echo was in 2004, and revealed preserved EF without diastolic dysfunction, mild MR, no WMAs.  She reports that for several weeks around 6:00 PM after eating dinner and standing to clean the dishes, she would feel dizzy, weak and vision would become blurry. This would improve after sitting for several minutes. She also notices this same sensation after bending over. She reports urinating frequently from taking her Lasix for leg swelling. She wears compression stockings regularly. She was washing dishes again the date of admission, felt lightheaded and weak, and sat down in the chair. Her son states she stared blankly for a brief moment, then sulked her head and passed out for 30 seconds. When she came to, she was not confused, had not bitten her tongue or lost bowel/bladder control. She was not wearing a tight-collared shirt. No witness involuntary movements. No chest pain, palpitations, SOB, DOE, PND, orthopnea leading up to or surrounding the event. EMS was called, and on arrival, BP returned low at 60/40s. Rhythm strip  done en route revealed a sinus pause of ~ 2.5 seconds. She was admitted by the medicine service and found to have acute renal insufficiency attributed to dehydration which has improved with gentle hydration overnight. There were no appreciable neurological deficits. Trop-Is have returned negative x 3. TTE pending. TSH pending. Atenolol has been held. Orthostatic VS WNL.    Problem List: Past Medical History  Diagnosis Date  . Syncope and collapse     Negative loop recorder in the past  . Hypertension   . AV block, 1st degree   . CVD (cerebrovascular disease)   . Osteoarthritis   . Hypothyroidism   . Hypokalemia   . Diarrhea   . Atrial fibrillation     not a candidate for coumadin due to falls  . Pneumonia 09/11  . Traumatic enucleation of eye     left eye  . History of echocardiogram 9/11    normal EF, mild to mod MR & TR, mod pulmonary HTN  . Varicose veins   . Advanced age     Past Surgical History  Procedure Date  . Back surgery   . Inguinal hernia repair     left sided     Allergies:  Allergies  Allergen Reactions  . Ciprocin-Fluocin-Procin (Fluocinolone Acetonide) Nausea And Vomiting  . Codeine Hives and Nausea And Vomiting  . Sulfa Drugs Cross Reactors Hives and Nausea And Vomiting    Home Medications: Prior to Admission medications   Medication Sig Start Date End Date Taking? Authorizing Provider  aspirin 81 MG tablet Take 81 mg by mouth at bedtime.    Yes  Historical Provider, MD  atenolol (TENORMIN) 25 MG tablet Take 12.5 mg by mouth daily.   Yes Historical Provider, MD  Cholecalciferol (VITAMIN D3) 1000 UNITS CAPS Take 1 capsule by mouth daily.    Yes Historical Provider, MD  Flaxseed, Linseed, (FLAX SEED OIL PO) Take 1 tablet by mouth 2 (two) times daily.    Yes Historical Provider, MD  furosemide (LASIX) 40 MG tablet Take 40 mg by mouth daily.   Yes Historical Provider, MD  levothyroxine (SYNTHROID, LEVOTHROID) 75 MCG tablet Take 75 mcg by mouth daily.      Yes Historical Provider, MD  Multiple Vitamins-Minerals (OCUVITE PRESERVISION PO) Take 1 tablet by mouth 2 (two) times daily.    Yes Historical Provider, MD  pravastatin (PRAVACHOL) 40 MG tablet Take 40 mg by mouth at bedtime.   Yes Historical Provider, MD  PRESCRIPTION MEDICATION Take 1 capsule by mouth at bedtime. Medication:Lyrica (dose unknown)   Yes Historical Provider, MD  promethazine (PHENERGAN) 25 MG tablet Take 25 mg by mouth every 6 (six) hours as needed. For nausea   Yes Historical Provider, MD  traMADol (ULTRAM) 50 MG tablet Take 50 mg by mouth every 6 (six) hours as needed. For pain   Yes Historical Provider, MD    Inpatient Medications:     . sodium chloride   Intravenous STAT  . aspirin  81 mg Oral Daily  . cholecalciferol  1,000 Units Oral Daily  . docusate sodium  100 mg Oral BID  . enoxaparin (LOVENOX) injection  30 mg Subcutaneous Q24H  . furosemide  40 mg Oral Daily  . levothyroxine  75 mcg Oral QAC breakfast  . simvastatin  20 mg Oral q1800  . sodium chloride  3 mL Intravenous Q12H   Prescriptions prior to admission  Medication Sig Dispense Refill  . aspirin 81 MG tablet Take 81 mg by mouth at bedtime.       Marland Kitchen atenolol (TENORMIN) 25 MG tablet Take 12.5 mg by mouth daily.      . Cholecalciferol (VITAMIN D3) 1000 UNITS CAPS Take 1 capsule by mouth daily.       . Flaxseed, Linseed, (FLAX SEED OIL PO) Take 1 tablet by mouth 2 (two) times daily.       . furosemide (LASIX) 40 MG tablet Take 40 mg by mouth daily.      Marland Kitchen levothyroxine (SYNTHROID, LEVOTHROID) 75 MCG tablet Take 75 mcg by mouth daily.        . Multiple Vitamins-Minerals (OCUVITE PRESERVISION PO) Take 1 tablet by mouth 2 (two) times daily.       . pravastatin (PRAVACHOL) 40 MG tablet Take 40 mg by mouth at bedtime.      Marland Kitchen PRESCRIPTION MEDICATION Take 1 capsule by mouth at bedtime. Medication:Lyrica (dose unknown)      . promethazine (PHENERGAN) 25 MG tablet Take 25 mg by mouth every 6 (six) hours as needed.  For nausea      . traMADol (ULTRAM) 50 MG tablet Take 50 mg by mouth every 6 (six) hours as needed. For pain        Family History  Problem Relation Age of Onset  . Heart attack Father   . Heart attack Brother   . Cancer Brother   . Stroke Brother      History   Social History  . Marital Status: Single    Spouse Name: N/A    Number of Children: N/A  . Years of Education: N/A   Occupational History  .  Not on file.   Social History Main Topics  . Smoking status: Never Smoker   . Smokeless tobacco: Not on file  . Alcohol Use: No  . Drug Use: No  . Sexually Active: No   Other Topics Concern  . Not on file   Social History Narrative  . No narrative on file     Review of Systems: General: positive for weakness, negative for chills, fever, night sweats or weight changes.  Cardiovascular: negative for chest pain, dyspnea on exertion, edema, orthopnea, palpitations, paroxysmal nocturnal dyspnea or shortness of breath Dermatological: negative for rash Respiratory: negative for cough or wheezing Urologic: negative for hematuria Abdominal: negative for nausea, vomiting, diarrhea, bright red blood per rectum, melena, or hematemesis Neurologic: positive for visual changes, syncope and dizziness All other systems reviewed and are otherwise negative except as noted above.  Physical Exam: Blood pressure 178/76, pulse 91, temperature 98.1 F (36.7 C), temperature source Oral, resp. rate 16, height 5\' 2"  (1.575 m), weight 53.434 kg (117 lb 12.8 oz), SpO2 97.00%.   General: Elderly, thin, well developed, in no acute distress. Head: Normocephalic, atraumatic, sclera non-icteric, no xanthomas, nares are without discharge. Well circumscribed 1 x 1 cm hyperpigmented, pedunculated lesion on left cheek.   Neck: Negative for carotid bruits. JVP to earlobe. Lungs: Clear bilaterally to auscultation without wheezes, rales, or rhonchi. Breathing is unlabored. Heart: RRR with S1 S2. No  murmurs, rubs, or gallops appreciated. Abdomen: Soft, non-tender, non-distended with normoactive bowel sounds. No hepatomegaly. No rebound/guarding. No obvious abdominal masses. Msk:  Strength and tone appears normal for age. Extremities: Bilateral lower extremity hyperpigmentation, trace nonpitting edema and engorged veins. No clubbing or cyanosis.  Distal pedal pulses are 2+ and equal bilaterally. Neuro: Alert and oriented X 3. Moves all extremities spontaneously. Psych:  Responds to questions appropriately with a normal affect.  Labs: Recent Labs  [facility] 05/25/12 0730 05/24/12 1905   WBC 7.2 8.2   HGB 10.8* 12.1   HCT 31.9* 36.2   MCV 94.7 94.0   PLT 229 228   Lab 05/25/12 0730 05/24/12 1905  NA 141 137  K 4.1 3.9  CL 107 100  CO2 26 23  BUN 22 26*  CREATININE 1.25* 1.57*  CALCIUM 8.8 9.3  PROT -- 6.7  BILITOT -- 0.3  ALKPHOS -- 54  ALT -- 20  AST -- 30  AMYLASE -- --  LIPASE -- --  GLUCOSE 92 134*   Recent Labs  Basename 05/25/12 0730 05/25/12 0147   CKTOTAL -- --   CKMB -- --   CKMBINDEX -- --   TROPONINI <0.30 <0.30   Radiology/Studies: Dg Chest Port 1 View  05/24/2012  *RADIOLOGY REPORT*  Clinical Data: Syncope  PORTABLE CHEST - 1 VIEW  Comparison: Prior chest x-ray 02/06/2011  Findings: Similar appearance of the lungs.  Right greater than left biapical pleural parenchymal scarring diffuse coarsening of the interstitial markings.  No new focal airspace consolidation or pulmonary edema.  Unchanged cardiac and mediastinal contours. Aortic atherosclerosis.  Degenerative changes of the bilateral coracoclavicular joints.  The bones are diffusely osteopenic.  IMPRESSION: No acute cardiopulmonary disease   Original Report Authenticated By: Malachy Moan, M.D.     EKG: NSR, 60 bpm, 1st degree AVB, no ST/T changes, no pauses Telemetry: NSR/sinus bradycardia, 50-70 bpm, trace 1.2 to 1.7 sec pauses  ASSESSMENT:   1. Syncope 2. PAF 3. Chronic venous  insufficiency 4. Prerenal azotemia  - resolved 5. Dehydration 6. Hypothyroidism 7. Advanced age 64.  History of CVA 9. GERD  DISCUSSION/PLAN:  Patient has a recurrent history of syncope. She undergone loop recorder placement without significant arrhythmic etiology identified. Etiology is likely vagal-mediated/vasodepressor syncope. Gathering from the history, presyncopal symptoms have occurred quite regularly recently after standing for prolonged periods or bending over. She has followed up in the office complaining of bilateral lower extremity edema for which Lasix had been prescribed with significant effect- increased urinary frequency. On admission, a mild prerenal azotemia attributed to dehydration was identified and quickly corrected with IV fluids. Pause from the en route EMS strip may have been vagal-mediated event and would be lower on the differential of syncopal etiologies. This has been ruled out with extended loop recorder monitoring in the past. Plan to discontinue Lasix for now. May be restarted at half the dose in the near future, however also advised that elevating her legs above the heart at rest may improve edema without causing dehydration. Review echo to rule out CHF (elevated JVP on exam, chronic LE edema). If no evidence of this, advise to stay hydrated at home. Have also recommended techniques to prevent sudden vagal-mediated hypotension/orthostasis (event though orthostatic VS did not support this, suspect there is some component of this at play given age and history). Continue to hold BB to allow for BP increase. May have to sacrifice BP control for HTN to allow more room to prevent further episodes of syncope.   Signed, R. Hurman Horn, PA-C 05/25/2012, 9:33 AM    Attending Note:   The patient was seen and examined.  Agree with assessment and plan as noted above.  [name] is a very pleasant 78 year old female I Have met before. She has a history of paroxysmal  atrial fibrillation, syncope, CVA, and hypertension. She has a history of recurrent episodes of syncope. She's had a loop recorder placed with no significant episodes of bradycardia or tachycardia to explain her episodes of syncope.  She is very active and lives independently.  She was admitted yesterday after having a week spell. She was doing the dishes in the kitchen. She became quite weak and had to sit down very quickly. EMS was called and upon their arrival she was found to have a blood pressure of 60/40. She was also noted to have some very short pauses of 2.5 seconds. She received IV fluids and is feeling better.  On exam her lungs are clear. Heart regular rate S1-S2. Her extremities have no edema. She has markedly decreased skin turgor.  Her telemetry strips reveal no significant pauses. She had a few episodes of 1.2-1.7 second pauses but these are not significant.  Impression:  I suspect that [name] has significant orthostatic hypotension and volume depletion. I suspect that her episodes of weakness and presyncope/syncope are due to volume depletion. I think that we will have hold her Lasix and atenolol in an attempt to to allow her blood pressure to rise slightly in order to avoid the episodes of hypotension.  I think it would be a mistake to aggressively treat her hypertension and to have her continue to have episodes of syncope. She has a history of frequent falls and she is at risk for hip fracture or other traumatic injury if she continues to have episodes of syncope.  We will hold her Lasix and atenolol for now. We will monitor her and will restart them as an outpatient as needed. I think that we will need to allow for fairly loose blood pressure control and not attempt to lower  her blood pressure.  I've asked her to not take extra doses of Lasix for cosmetic leg edema. If she has significant diastolic dysfunction and shortness of breath then we certainly will need to treat her  with Lasix but I would not treat leg edema with furosemide. I've encouraged her to wear compression hose or use leg elevation to control her leg edema.    [name], Montez Hageman., MD, [facility] 05/25/2012, 10:27 AM

## 2012-05-25 NOTE — Progress Notes (Signed)
Patient seen and examined. Agree with above note. Plan is for DC home today. Please see DC Summary for further details.  Peggye Pitt, MD [facility] Pager: [phone]

## 2012-05-25 NOTE — Progress Notes (Signed)
Utilization review completed.  

## 2012-05-25 NOTE — Progress Notes (Signed)
[facility] PROGRESS NOTE  [name] ZOX:[id number] DOB: 03/16/1915 DOA: 05/24/2012 PCP: Nadean Corwin, MD  Assessment/Plan: Syncope: EKG shows prolonged PR interval consistent with first degree AV block. Patient also had a sinus pause of more than 2 seconds on rhythm strip done by EMS. Differential for her presentation include orthostatic hypotension vs vasovagal vs sick sinus syndrome. Cardiac enzymes have been negative to date. Neurological causes are less likely in the absence of any focal neurological deficits. 2 D echo pending. Completed 12 hours IV fluids. Will recheck orthostatics. [facility] was consulted:will see this am  Acute kidney injury: Her baseline Cr 1.09-1.15. She presented with Cr of 1.57. Likely pre- renal 2/2 dehydration. Creatinine down to 1.25. Continue to monitor  Atrial fibrillation: Rate controlled. HR range 61-87. Atenolol on hold in the setting of syncope with sinus pause on rhythm strip by EMS.  HTN Controlled. Holding BB. Continuing lasix. Monitor closely.  CVD: see #1. Continue statin. Cards to consult Hypothyroidism: Last TSH is available from 09/11 which was WNL. Continue synthroid. TSH in process.  DVT PPX: Lovenox  Code Status: full Family Communication: son at bedside Disposition Plan: hope with son when ready. Hopefully 24 hours   Consultants:  cardiology  Procedures:  none  Antibiotics:  none  HPI/Subjective: Sitting up in bed awake alert. Somewhat HOH. Denies pain/discomfort. NAD  Objective: Filed Vitals:   05/24/12 2200 05/24/12 2215 05/24/12 2250 05/25/12 0510  BP: 145/66 147/63 167/89 136/53  Pulse: 83 82 80 61  Temp:   97.9 F (36.6 C) 98.1 F (36.7 C)  TempSrc:   Oral Oral  Resp: 19 12 16 16   Height:   5\' 2"  (1.575 m)   Weight:   53.434 kg (117 lb 12.8 oz)   SpO2: 94% 97% 95% 97%    Intake/Output Summary (Last 24 hours) at 05/25/12 0905 Last data filed at 05/25/12 0200  Gross per 24 hour    Intake      3 ml  Output    500 ml  Net   -497 ml   Filed Weights   05/24/12 2250  Weight: 53.434 kg (117 lb 12.8 oz)    Exam:   General:  Awake alert, thin somewhat frail appearing  Cardiovascular: RRR HS distant No MGR No LEE PPP  Respiratory: normal effort BSCTAB No wheeze/rhonchi  Abdomen: soft +BS non-tender to palpation  Data Reviewed: Basic Metabolic Panel:  Lab 05/25/12 8119 05/25/12 0130 05/24/12 1905  NA 141 -- 137  K 4.1 -- 3.9  CL 107 -- 100  CO2 26 -- 23  GLUCOSE 92 -- 134*  BUN 22 -- 26*  CREATININE 1.25* -- 1.57*  CALCIUM 8.8 -- 9.3  MG -- 2.1 --  PHOS -- -- --   Liver Function Tests:  Lab 05/24/12 1905  AST 30  ALT 20  ALKPHOS 54  BILITOT 0.3  PROT 6.7  ALBUMIN 3.5   No results found for this basename: LIPASE:5,AMYLASE:5 in the last 168 hours No results found for this basename: AMMONIA:5 in the last 168 hours CBC:  Lab 05/25/12 0730 05/24/12 1905  WBC 7.2 8.2  NEUTROABS -- 4.2  HGB 10.8* 12.1  HCT 31.9* 36.2  MCV 94.7 94.0  PLT 229 228   Cardiac Enzymes:  Lab 05/25/12 0730 05/25/12 0147  CKTOTAL -- --  CKMB -- --  CKMBINDEX -- --  TROPONINI <0.30 <0.30   BNP (last 3 results) No results found for this basename: PROBNP:3 in the last 8760 hours  CBG: No results found for this basename: GLUCAP:5 in the last 168 hours  No results found for this or any previous visit (from the past 240 hour(s)).   Studies: Dg Chest Port 1 View  05/24/2012  *RADIOLOGY REPORT*  Clinical Data: Syncope  PORTABLE CHEST - 1 VIEW  Comparison: Prior chest x-ray 02/06/2011  Findings: Similar appearance of the lungs.  Right greater than left biapical pleural parenchymal scarring diffuse coarsening of the interstitial markings.  No new focal airspace consolidation or pulmonary edema.  Unchanged cardiac and mediastinal contours. Aortic atherosclerosis.  Degenerative changes of the bilateral coracoclavicular joints.  The bones are diffusely osteopenic.   IMPRESSION: No acute cardiopulmonary disease   Original Report Authenticated By: Malachy Moan, M.D.     Scheduled Meds:    . sodium chloride   Intravenous STAT  . aspirin  81 mg Oral Daily  . cholecalciferol  1,000 Units Oral Daily  . docusate sodium  100 mg Oral BID  . enoxaparin (LOVENOX) injection  30 mg Subcutaneous Q24H  . furosemide  40 mg Oral Daily  . levothyroxine  75 mcg Oral QAC breakfast  . simvastatin  20 mg Oral q1800  . sodium chloride  3 mL Intravenous Q12H   Continuous Infusions:    . sodium chloride 100 mL/hr at 05/25/12 0134    Principal Problem:  *Syncope Active Problems:  Hypertension  AV block, 1st degree  CVD (cerebrovascular disease)  Hypothyroidism  Atrial fibrillation  Acute kidney injury    Time spent: 30 minutes    [facility] M  [facility]  If 8PM-8AM, please contact night-coverage at [email or web address], password [facility] 05/25/2012, 9:05 AM  LOS: 1 day

## 2012-05-25 NOTE — Telephone Encounter (Signed)
Have talked with patient's daughter. I will see her back next week for her follow up.

## 2012-05-25 NOTE — Progress Notes (Signed)
  Echocardiogram 2D Echocardiogram has been performed.  Ellender Hose A 05/25/2012, 11:38 AM

## 2012-05-25 NOTE — Discharge Summary (Signed)
Patient seen and examined. Agree with note by Toya Smothers, NP. Patient will be discharged home today. We believe the cause for her presyncopal evnet was orthostatic hypotension, as thus her lasix and atenolol have been discontinued. Cardiology thinks (and I agree) that we should not be overly aggressive treating her HTN as she has a long history of orthostasis, dizziness and syncopal events.  Peggye Pitt, MD [facility] Pager: [phone]

## 2012-05-25 NOTE — Discharge Summary (Signed)
Physician Discharge Summary  [name] ZOX:[id number] DOB: 09-20-1914 DOA: 05/24/2012  PCP: Nadean Corwin, MD  Admit date: 05/24/2012 Discharge date: 05/25/2012  Time spent: 40 minutes  Recommendations for Outpatient Follow-up:  1. Follow up with PCP 1 week 2. Will need close monitoring of BP as lasix and BB discontinued per cardiology at discharge. 3. Will need results of echo to be reviewed by PCP   Discharge Diagnoses:  Principal Problem:  *Syncope Active Problems:  Hypertension  AV block, 1st degree  CVD (cerebrovascular disease)  Hypothyroidism  Atrial fibrillation  Acute kidney injury   Discharge Condition: stable  Diet recommendation: heart healthy   Filed Weights   05/24/12 2250  Weight: 53.434 kg (117 lb 12.8 oz)    History of present illness:  Patient is a 78 y.o. woman with a PMHx of hypothyroidism, atrial fibrillation, first degree AV block presented to the ER on 05/24/12 with chief complaint of syncope. Patient was apparently doing well and was cooking in the kitchen and suddenly she felt weak and sat in a chair. She lost consciousness and stopped responding for about 30 seconds to a minute and then regained complete consciousness. Patient did not have any seizure, headaches, weakness, numbness, alteration in speech or blurred vision. EMS was called who found her blood pressure to be 60s over 40. Rhythm strip done well transportation reviewed a sinus pause of about 2.5 seconds. The patient is fairly active otherwise and did 3 loads of laundry the day of admission. Patient has had previous episodes of syncope and falls and has had an implanted loop recorder for about 1-1/2 years which did not show any pauses about 5 years ago. Patient is seen by [facility] cardiology.      Hospital Course:  Syncope: EKG shows prolonged PR interval consistent with first degree AV block. Patient also had a sinus pause of more than 2 seconds on rhythm strip done by EMS.  Differential for her presentation included orthostatic hypotension vs vasovagal vs sick sinus syndrome. Cardiac enzymes negative. Neurological causes are less likely in the absence of any focal neurological deficits. 2 D echo results pending at discharge pending. Cardiology opined that if echo yields significant diastolic dysfunction and SOB then would need lasix.  Completed 12 hours IV fluids and pt not orthostatic at discharge. Seen by cardiology Dr. Elease Hashimoto on day of discharge who recommended discontinuing lasix and BB. He opined that orthostatic hypotension with volume depletion are main issues. He recommended not being aggressive with BP control . Pt will need to be followed up by PCP 1 week for monitoring. Educated to wear compression stockings for LEE vs taking lasix.  Acute kidney injury: Her baseline Cr 1.09-1.15. She presented with Cr of 1.57. Likely pre- renal 2/2 dehydration. Given IV fluids Creatinine down to 1.25 at discharge. Recommend bmet in 1 week to monitor  Atrial fibrillation: Rate controlled. HR range 61-87. Atenolol held initially in the setting of syncope with sinus pause on rhythm strip. Seen by cardiology. See #1.   HTN Controlled. Holding BB. Will discontinue lasix and BB at discharge. Cards recommend close monitoring of BP and restarting on OP basis as needed but aggressive treatment not recommended CVD: see #1. Continue statin. Stab;e  Hypothyroidism: TSH 0.81. Continue synthroid     Procedures:  none  Consultations:  cardiology  Discharge Exam: Filed Vitals:   05/25/12 0911 05/25/12 0912 05/25/12 0913 05/25/12 0916  BP: 134/56 163/67 161/81 178/76  Pulse: 75 80 86 91  Temp:  TempSrc:      Resp:      Height:      Weight:      SpO2:        General: awake alert NAD Cardiovascular: RRR No MGR No LEE PPP Respiratory: normal effort BSCTAB No wheeze  Discharge Instructions  Discharge Orders    Future Orders Please Complete By Expires   Diet - low sodium  heart healthy      Increase activity slowly      Call MD for:  persistant nausea and vomiting      Call MD for:  difficulty breathing, headache or visual disturbances      Call MD for:  persistant dizziness or light-headedness          Medication List     As of 05/25/2012  1:41 PM    STOP taking these medications         atenolol 25 MG tablet   Commonly known as: TENORMIN      furosemide 40 MG tablet   Commonly known as: LASIX      TAKE these medications         aspirin 81 MG tablet   Take 81 mg by mouth at bedtime.      DSS 100 MG Caps   Take 100 mg by mouth 2 (two) times daily.      FLAX SEED OIL PO   Take 1 tablet by mouth 2 (two) times daily.      levothyroxine 75 MCG tablet   Commonly known as: SYNTHROID, LEVOTHROID   Take 75 mcg by mouth daily.      OCUVITE PRESERVISION PO   Take 1 tablet by mouth 2 (two) times daily.      pravastatin 40 MG tablet   Commonly known as: PRAVACHOL   Take 40 mg by mouth at bedtime.      PRESCRIPTION MEDICATION   Take 1 capsule by mouth at bedtime. Medication:Lyrica (dose unknown)      promethazine 25 MG tablet   Commonly known as: PHENERGAN   Take 25 mg by mouth every 6 (six) hours as needed. For nausea      traMADol 50 MG tablet   Commonly known as: ULTRAM   Take 50 mg by mouth every 6 (six) hours as needed. For pain      Vitamin D3 1000 UNITS Caps   Take 1 capsule by mouth daily.           Follow-up Information    Follow up with MCKEOWN,WILLIAM DAVID, MD. Schedule an appointment as soon as possible for a visit in 1 week. (will need BP evaluation as lasix and BB discontnued at discharge in effort to avoid further syncope)    Contact information:   [id number] [facility] [address] [phone]           The results of significant diagnostics from this hospitalization (including imaging, microbiology, ancillary and laboratory) are listed below for reference.    Significant Diagnostic  Studies: Dg Chest Port 1 View  05/24/2012  *RADIOLOGY REPORT*  Clinical Data: Syncope  PORTABLE CHEST - 1 VIEW  Comparison: Prior chest x-ray 02/06/2011  Findings: Similar appearance of the lungs.  Right greater than left biapical pleural parenchymal scarring diffuse coarsening of the interstitial markings.  No new focal airspace consolidation or pulmonary edema.  Unchanged cardiac and mediastinal contours. Aortic atherosclerosis.  Degenerative changes of the bilateral coracoclavicular joints.  The bones are diffusely osteopenic.  IMPRESSION: No acute  cardiopulmonary disease   Original Report Authenticated By: Malachy Moan, M.D.     Microbiology: No results found for this or any previous visit (from the past 240 hour(s)).   Labs: Basic Metabolic Panel:  Lab 05/25/12 1610 05/25/12 0130 05/24/12 1905  NA 141 -- 137  K 4.1 -- 3.9  CL 107 -- 100  CO2 26 -- 23  GLUCOSE 92 -- 134*  BUN 22 -- 26*  CREATININE 1.25* -- 1.57*  CALCIUM 8.8 -- 9.3  MG -- 2.1 --  PHOS -- -- --   Liver Function Tests:  Lab 05/24/12 1905  AST 30  ALT 20  ALKPHOS 54  BILITOT 0.3  PROT 6.7  ALBUMIN 3.5   No results found for this basename: LIPASE:5,AMYLASE:5 in the last 168 hours No results found for this basename: AMMONIA:5 in the last 168 hours CBC:  Lab 05/25/12 0730 05/24/12 1905  WBC 7.2 8.2  NEUTROABS -- 4.2  HGB 10.8* 12.1  HCT 31.9* 36.2  MCV 94.7 94.0  PLT 229 228   Cardiac Enzymes:  Lab 05/25/12 0730 05/25/12 0147  CKTOTAL -- --  CKMB -- --  CKMBINDEX -- --  TROPONINI <0.30 <0.30   BNP: BNP (last 3 results) No results found for this basename: PROBNP:3 in the last 8760 hours CBG: No results found for this basename: GLUCAP:5 in the last 168 hours     Signed:  Gwenyth Bender  [facility] 05/25/2012, 1:41 PM

## 2012-05-25 NOTE — Progress Notes (Signed)
Pt discharged to home per MD order. Pt and family (son and daughter) received and reviewed all discharge instructions and medication information including follow-up appointments and prescriptions.  Pt and family verbalized understanding.  Pt alert and oriented at discharge with no complaints of pain.  Pt escorted to private vehicle via wheelchair.  [name]

## 2012-06-02 ENCOUNTER — Encounter: Payer: Self-pay | Admitting: Nurse Practitioner

## 2012-06-02 ENCOUNTER — Ambulatory Visit (INDEPENDENT_AMBULATORY_CARE_PROVIDER_SITE_OTHER): Payer: Medicare Other | Admitting: Nurse Practitioner

## 2012-06-02 VITALS — BP 128/66 | HR 64 | Ht 62.0 in | Wt 118.4 lb

## 2012-06-02 DIAGNOSIS — R42 Dizziness and giddiness: Secondary | ICD-10-CM

## 2012-06-02 MED ORDER — FUROSEMIDE 40 MG PO TABS: 20.0000 mg | ORAL_TABLET | Freq: Every day | ORAL | Status: DC

## 2012-06-02 NOTE — Progress Notes (Signed)
[name] Date of Birth: 08-11-1914 Medical Record #[id number]  History of Present Illness: [name] is seen back today for a post hospital visit. She is seen for Dr. Elease Hashimoto. She has a remote history of syncope with a past loop recorder implant that turned out to be unremarkable. This was several years ago. Her other issues include PAF, HTN, cerebrovascular disease, OA, hypothyroidism and advanced age. She has been managed medically. Not felt to be a good candidate for coumadin because of her age and unsteady gait. She has chronic back pain as well.  I saw her back in October. She was doing ok at that time. Was using minimal extra beta blocker for her blood pressure.   She was admitted to the hospital in December with a recurrent syncopal spell. She had been cooking in the kitchen and got weak and went to sit down. She lost consciousness and was out for about 30 to 60 seconds, then regained consciousness. No seizure, HA, weakness, etc. EMS was called. BP was low (60's/40's). She had a sinus pause on EKG of about 2.5 seconds (daughter has brought in strips for review). She had been fairly active and had done 3 loads of laundry that day. Dr. Elease Hashimoto saw her and felt that this was orthostasis/dehydation. Her lasix was stopped. Her beta blocker was stopped. BP is to be more liberal and we are not to achieve aggressive control. She was given some IV fluids. Her discharge summary mentions atrial fib during that time but all of her EKGs that I see show sinus rhythm. His suggestion was to not be aggressive with her blood pressure control and to use support stockings.   She comes in today. She is here with her family. She is doing ok. She has had no more spells. She notes that prior to this last spell, she had started having dizziness around 6 pm. She would have to sit down. Since discharge she has not had recurrence. She is off of her tenormin and her lasix. Her legs are swelling more. She may be a little more  short of breath. Weight is going up some. No chest pain. Her blood pressures are primarily in the 150's at home but better here today.   Current Outpatient Prescriptions on File Prior to Visit  Medication Sig Dispense Refill  . aspirin 81 MG tablet Take 81 mg by mouth at bedtime.       . Cholecalciferol (VITAMIN D3) 1000 UNITS CAPS Take 1 capsule by mouth daily.       . Flaxseed, Linseed, (FLAX SEED OIL PO) Take 1 tablet by mouth 2 (two) times daily.       Marland Kitchen levothyroxine (SYNTHROID, LEVOTHROID) 75 MCG tablet Take 75 mcg by mouth daily.        . Multiple Vitamins-Minerals (OCUVITE PRESERVISION PO) Take 1 tablet by mouth 2 (two) times daily.       . pravastatin (PRAVACHOL) 40 MG tablet Take 40 mg by mouth at bedtime.      . promethazine (PHENERGAN) 25 MG tablet Take 25 mg by mouth every 6 (six) hours as needed. For nausea      . traMADol (ULTRAM) 50 MG tablet Take 50 mg by mouth every 6 (six) hours as needed. For pain        Allergies  Allergen Reactions  . Ciprocin-Fluocin-Procin (Fluocinolone Acetonide) Nausea And Vomiting  . Codeine Hives and Nausea And Vomiting  . Sulfa Drugs Cross Reactors Hives and Nausea And Vomiting  Past Medical History  Diagnosis Date  . Syncope and collapse     Negative loop recorder in the past  . Hypertension   . AV block, 1st degree   . CVD (cerebrovascular disease)     History of CVA  . Osteoarthritis   . Hypothyroidism   . Hypokalemia   . Diarrhea   . Atrial fibrillation     not a candidate for coumadin due to falls  . Pneumonia 09/11  . Traumatic enucleation of eye     left eye  . History of echocardiogram 9/11    normal EF, mild to mod MR & TR, mod pulmonary HTN  . Varicose veins   . Advanced age   . GERD (gastroesophageal reflux disease)     Past Surgical History  Procedure Date  . Back surgery   . Inguinal hernia repair     left sided    History  Smoking status  . Never Smoker   Smokeless tobacco  . Not on file     History  Alcohol Use No    Family History  Problem Relation Age of Onset  . Heart attack Father   . Heart attack Brother   . Cancer Brother   . Stroke Brother     Review of Systems: The review of systems is per the HPI.  All other systems were reviewed and are negative.  Physical Exam: BP 128/66  Pulse 64  Ht 5\' 2"  (1.575 m)  Wt 118 lb 6.4 oz (53.706 kg)  BMI 21.66 kg/m2 Patient is very pleasant and in no acute distress. Skin is warm and dry. Color is normal.  HEENT is unremarkable. She is hard of hearing. Normocephalic/atraumatic. PERRL. Sclera are nonicteric. Neck is supple. No masses. No JVD. Lungs are clear. Cardiac exam shows an irregular rhythm. Rate is controlled. Abdomen is soft. Extremities are with 1 to 2+ edema right greater than left. Gait and ROM are intact. No gross neurologic deficits noted.   LABORATORY DATA: BMET and CBC are pending  Echo Study Conclusions  - Left ventricle: The cavity size was normal. Wall thickness was normal. Systolic function was normal. The estimated ejection fraction was in the range of 60% to 65%. Features are consistent with a pseudonormal left ventricular filling pattern, with concomitant abnormal relaxation and increased filling pressure (grade 2 diastolic dysfunction). - Mitral valve: Mild regurgitation. - Pulmonary arteries: Systolic pressure was moderately increased. PA peak pressure: 54mm Hg (S).  Lab Results  Component Value Date   WBC 7.2 05/25/2012   HGB 10.8* 05/25/2012   HCT 31.9* 05/25/2012   PLT 229 05/25/2012   GLUCOSE 92 05/25/2012   ALT 20 05/24/2012   AST 30 05/24/2012   NA 141 05/25/2012   K 4.1 05/25/2012   CL 107 05/25/2012   CREATININE 1.25* 05/25/2012   BUN 22 05/25/2012   CO2 26 05/25/2012   TSH 0.812 05/25/2012   INR 0.94 02/06/2011     Assessment / Plan: 1. Syncope - no recurrence. Off of her beta blocker. If she has more dizzy spells, then we will place an event monitor. Tentatively  see her back in 3 months.  2. HTN - blood pressure looks better here today. Will not treat aggressively  3. CRI - recheck today  4. Anemia, mild - recheck today  5. Diastolic heart failure - more swelling on exam. She does use support stockings and elevates her legs. I will let her have just 20 mg of Lasix to use  only prn and not every day.   Patient is agreeable to this plan and will call if any problems develop in the interim.

## 2012-06-02 NOTE — Patient Instructions (Addendum)
You can use only a half of tablet of Lasix (20 mg) just as needed for swelling. Do not take everyday.   I will see you in 3 months  We will check labs today  Call us if she has any more dizzy spells  Call the [facility] office at [phone] if you have any questions, problems or concerns.

## 2012-06-03 LAB — CBC WITH DIFFERENTIAL/PLATELET
Basophils Absolute: 0.1 K/uL (ref 0.0–0.1)
Basophils Relative: 0.8 % (ref 0.0–3.0)
Eosinophils Absolute: 0.6 K/uL (ref 0.0–0.7)
Eosinophils Relative: 7.5 % — ABNORMAL HIGH (ref 0.0–5.0)
HCT: 34.4 % — ABNORMAL LOW (ref 36.0–46.0)
Hemoglobin: 11.6 g/dL — ABNORMAL LOW (ref 12.0–15.0)
Lymphocytes Relative: 30.6 % (ref 12.0–46.0)
Lymphs Abs: 2.4 K/uL (ref 0.7–4.0)
MCHC: 33.6 g/dL (ref 30.0–36.0)
MCV: 94.2 fl (ref 78.0–100.0)
Monocytes Absolute: 1.1 K/uL — ABNORMAL HIGH (ref 0.1–1.0)
Monocytes Relative: 14.2 % — ABNORMAL HIGH (ref 3.0–12.0)
Neutro Abs: 3.7 K/uL (ref 1.4–7.7)
Neutrophils Relative %: 46.9 % (ref 43.0–77.0)
Platelets: 280 K/uL (ref 150.0–400.0)
RBC: 3.65 Mil/uL — ABNORMAL LOW (ref 3.87–5.11)
RDW: 13.1 % (ref 11.5–14.6)
WBC: 7.9 K/uL (ref 4.5–10.5)

## 2012-06-03 LAB — BASIC METABOLIC PANEL WITH GFR
BUN: 23 mg/dL (ref 6–23)
CO2: 27 meq/L (ref 19–32)
Calcium: 9.3 mg/dL (ref 8.4–10.5)
Chloride: 105 meq/L (ref 96–112)
Creatinine, Ser: 1.2 mg/dL (ref 0.4–1.2)
GFR: 43.5 mL/min — ABNORMAL LOW (ref >60.00)
Glucose, Bld: 87 mg/dL (ref 70–99)
Potassium: 4.4 meq/L (ref 3.5–5.1)
Sodium: 139 meq/L (ref 135–145)

## 2012-06-15 ENCOUNTER — Other Ambulatory Visit: Payer: Self-pay | Admitting: Nurse Practitioner

## 2012-06-16 ENCOUNTER — Other Ambulatory Visit: Payer: Self-pay | Admitting: Nurse Practitioner

## 2012-06-17 ENCOUNTER — Encounter (INDEPENDENT_AMBULATORY_CARE_PROVIDER_SITE_OTHER): Payer: Self-pay

## 2012-08-25 ENCOUNTER — Encounter: Payer: Self-pay | Admitting: Nurse Practitioner

## 2012-08-25 ENCOUNTER — Ambulatory Visit (INDEPENDENT_AMBULATORY_CARE_PROVIDER_SITE_OTHER): Payer: Medicare Other | Admitting: Nurse Practitioner

## 2012-08-25 VITALS — BP 150/70 | HR 80 | Ht 61.0 in | Wt 116.1 lb

## 2012-08-25 DIAGNOSIS — R42 Dizziness and giddiness: Secondary | ICD-10-CM

## 2012-08-25 NOTE — Patient Instructions (Addendum)
I think you are doing well.   Stay on your current medicines  I will see you back in 6 months  Call the [facility] office at [phone] if you have any questions, problems or concerns.   

## 2012-08-25 NOTE — Progress Notes (Signed)
[name] Date of Birth: 01-24-1915 Medical Record #[id number]  History of Present Illness: [name] is seen back today for a 3 month check. She is seen for Dr. Elease Hashimoto. Former patient of Dr. Ronnald Nian. She has a remote history of syncope with a past loop recorder implant that turned out to be unremarkable. This was several years ago. Her other issues include PAF, HTN, cerebrovascular disease, OA, hypothyroidism and advanced age. She has been managed medically. Not felt to be a good candidate for coumadin because of her age and unsteady gait. She has chronic back pain as well.   I saw her back in October. She was doing ok at that time. Was using minimal extra beta blocker for her blood pressure.    She was admitted to the hospital in December 2013 with a recurrent syncopal spell. She had been cooking in the kitchen and got weak and went to sit down. She lost consciousness and was out for about 30 to 60 seconds, then regained consciousness. No seizure, HA, weakness, etc. EMS was called. BP was low (60's/40's). She had a sinus pause on EKG of about 2.5 seconds (daughter has brought in strips for review). She had been fairly active and had done 3 loads of laundry that day. Dr. Elease Hashimoto saw her and felt that this was orthostasis/dehydation. Her lasix was stopped. Her beta blocker was stopped. BP is to be more liberal and we are not to achieve aggressive control. She was given some IV fluids. Her discharge summary mentions atrial fib during that time but all of her EKGs that I see show sinus rhythm. His suggestion was to not be aggressive with her blood pressure control and to use support stockings.   She comes back today. She is here with family. Doing ok. Still cooking. Doing light housework. Actually hung some curtains and cleaned the walls in her kitchen today. No chest pain. Does fatigue easier. Mostly limited by weak knees. BP is running 150 to 160 at home.    Current Outpatient Prescriptions on File  Prior to Visit  Medication Sig Dispense Refill  . aspirin 81 MG tablet Take 81 mg by mouth at bedtime.       . Cholecalciferol (VITAMIN D3) 1000 UNITS CAPS Take 1 capsule by mouth daily.       . Flaxseed, Linseed, (FLAX SEED OIL PO) Take 1 tablet by mouth 2 (two) times daily.       . magnesium 30 MG tablet Take 250 mg by mouth daily.      . Multiple Vitamins-Minerals (OCUVITE PRESERVISION PO) Take 1 tablet by mouth 2 (two) times daily.       . pravastatin (PRAVACHOL) 40 MG tablet Take 20 mg by mouth at bedtime.       . promethazine (PHENERGAN) 25 MG tablet Take 25 mg by mouth every 6 (six) hours as needed. For nausea      . SYNTHROID 75 MCG tablet TAKE 1 TABLET EVERY DAY  90 tablet  4  . traMADol (ULTRAM) 50 MG tablet Take 50 mg by mouth every 6 (six) hours as needed. For pain       No current facility-administered medications on file prior to visit.    Allergies  Allergen Reactions  . Ciprocin-Fluocin-Procin (Fluocinolone Acetonide) Nausea And Vomiting  . Codeine Hives and Nausea And Vomiting  . Sulfa Drugs Cross Reactors Hives and Nausea And Vomiting    Past Medical History  Diagnosis Date  . Syncope and collapse  Negative loop recorder in the past  . Hypertension   . AV block, 1st degree   . CVD (cerebrovascular disease)     History of CVA  . Osteoarthritis   . Hypothyroidism   . Hypokalemia   . Diarrhea   . Atrial fibrillation     not a candidate for coumadin due to falls  . Pneumonia 09/11  . Traumatic enucleation of eye     left eye  . History of echocardiogram 9/11    normal EF, mild to mod MR & TR, mod pulmonary HTN  . Varicose veins   . Advanced age   . GERD (gastroesophageal reflux disease)     Past Surgical History  Procedure Laterality Date  . Back surgery    . Inguinal hernia repair      left sided    History  Smoking status  . Never Smoker   Smokeless tobacco  . Not on file    History  Alcohol Use No    Family History  Problem Relation  Age of Onset  . Heart attack Father   . Heart attack Brother   . Cancer Brother   . Stroke Brother     Review of Systems: The review of systems is per the HPI.  All other systems were reviewed and are negative.  Physical Exam: BP 150/70  Pulse 80  Ht 5\' 1"  (1.549 m)  Wt 116 lb 1.9 oz (52.672 kg)  BMI 21.95 kg/m2 Patient is very pleasant and in no acute distress. She looks younger than her stated age. Skin is warm and dry. Color is normal.  HEENT is unremarkable. Normocephalic/atraumatic. PERRL. Sclera are nonicteric. Neck is supple. No masses. No JVD. Lungs are clear. Cardiac exam shows an irregular rhythm. Rate is ok today.  Abdomen is soft. Extremities are without edema. She has her support stockings on. Gait and ROM are intact. No gross neurologic deficits noted.  LABORATORY DATA: Lab Results  Component Value Date   WBC 7.9 06/02/2012   HGB 11.6* 06/02/2012   HCT 34.4* 06/02/2012   PLT 280.0 06/02/2012   GLUCOSE 87 06/02/2012   ALT 20 05/24/2012   AST 30 05/24/2012   NA 139 06/02/2012   K 4.4 06/02/2012   CL 105 06/02/2012   CREATININE 1.2 06/02/2012   BUN 23 06/02/2012   CO2 27 06/02/2012   TSH 0.812 05/25/2012   INR 0.94 02/06/2011    Assessment / Plan: 1. Syncope - remains off of her beta blocker - no recurrence.  2. HTN - BP is acceptable.   3. CRI - lab from January looked ok.  4. Anemia - remains mild  5. Diastolic heart failure - using just prn Lasix. Seems compensated.   6. Advanced age - she does look like she is slowing down physically.   Will see her back in 6 months. No change in her medicines.   Patient is agreeable to this plan and will call if any problems develop in the interim.   Rosalio Macadamia, RN, ANP-C [facility] [address] [place], [place]  [address]

## 2012-09-01 ENCOUNTER — Ambulatory Visit: Payer: Medicare Other | Admitting: Nurse Practitioner

## 2012-09-01 ENCOUNTER — Telehealth: Payer: Self-pay | Admitting: Nurse Practitioner

## 2012-09-01 NOTE — Telephone Encounter (Signed)
Discussed with Dr Elease Hashimoto and patient is to just watch her salt intake and continue to monitor. Dr Elease Hashimoto is very concerned about her hypotensive episode that she presented to ED in December with. Advised son and will call son back tomorrow with appointment soon

## 2012-09-01 NOTE — Telephone Encounter (Signed)
It does not appear that I have ever seen Ms. Flanigan.  Would you see if Lawson Fiscal can address this.

## 2012-09-01 NOTE — Telephone Encounter (Signed)
Blood pressure just started going up over the last few days. No change in dietary intake or pain. Patient was previously on blood pressure medication but it was discontinued in December secondary to a syncopal episode.

## 2012-09-01 NOTE — Telephone Encounter (Signed)
New problem    pts daughter giving f/u on pts b/p: higher pass 4 days: 08/29/12: 195/89--08/30/12: 187/82--- 08/31/12: 154/80---last night 183/103

## 2012-09-03 ENCOUNTER — Telehealth: Payer: Self-pay | Admitting: Nurse Practitioner

## 2012-09-03 NOTE — Telephone Encounter (Signed)
New Problem:    Patient's son called in wanting to thank you for your patientnce and assistance today.  Please call back if you would like.

## 2012-09-03 NOTE — Telephone Encounter (Signed)
Scheduled appointment with Di Kindle PA for [facility]

## 2012-09-03 NOTE — Telephone Encounter (Signed)
Son cancelled appointment with Di Kindle PA, had discussed with her mom and she decided not to come. Told him to call back if she continues to have problems with blood pressure

## 2012-09-06 ENCOUNTER — Ambulatory Visit: Payer: Medicare Other | Admitting: Nurse Practitioner

## 2012-10-11 ENCOUNTER — Telehealth (INDEPENDENT_AMBULATORY_CARE_PROVIDER_SITE_OTHER): Payer: Self-pay | Admitting: General Surgery

## 2012-10-11 ENCOUNTER — Telehealth (INDEPENDENT_AMBULATORY_CARE_PROVIDER_SITE_OTHER): Payer: Self-pay | Admitting: *Deleted

## 2012-10-11 NOTE — Telephone Encounter (Signed)
I spoke with the pt's sone in regards to her leg pain.  When I had seen her in clinic in clinic I had prescribed a truss to help relieve her sx's.  According to him , she purchased the truss, but has not worn it.  She complains of pain while walking, which could be attributed to her hernia, however, her hernia should not incapacitate her.    Again secondary to her high cardiac risks, I would maximze non-operative tx at this time, which mainly consists of the abdominal truss.

## 2012-10-11 NOTE — Telephone Encounter (Signed)
Family asking for Dr. Jacinto Halim opinion as to whether or not the right inguinal hernia could be causing the patient pain and inability to use her right leg along with right groin pain.

## 2012-11-17 ENCOUNTER — Encounter: Payer: Self-pay | Admitting: Cardiology

## 2013-02-03 ENCOUNTER — Telehealth: Payer: Self-pay | Admitting: Nurse Practitioner

## 2013-02-03 NOTE — Telephone Encounter (Signed)
Daughter states that her mother's BP has been running 170- 180 for past couple of weeks. States that [name] takes her BP twice a day. She didn't know what it was running this AM but last PM was 170/  States she has been having a HA for past 4-5 days.  Has taken tylenol but with no relief.  Occ is lighteded but has not had any episodes of passing out.  Advised Lawson Fiscal was not in office today.  Advised her to call Dr. Oneta Rack to have BP checked.  States if she can't get in to see him today she will call us back.

## 2013-02-03 NOTE — Telephone Encounter (Signed)
New message:  Pt has had headache for several days but is much worse this am.  D/C BP several months since January and they are wondering if this could be BP related.  Please give daughter [name] and advise what to do.  Would like to bring her mother in for check up today.

## 2013-02-04 ENCOUNTER — Other Ambulatory Visit: Payer: Self-pay | Admitting: Internal Medicine

## 2013-02-04 DIAGNOSIS — R109 Unspecified abdominal pain: Secondary | ICD-10-CM

## 2013-02-09 ENCOUNTER — Ambulatory Visit
Admission: RE | Admit: 2013-02-09 | Discharge: 2013-02-09 | Disposition: A | Discharge status: Home / Self Care | Payer: Medicare Other | Source: Ambulatory Visit | Attending: Internal Medicine | Admitting: Internal Medicine

## 2013-02-09 DIAGNOSIS — R109 Unspecified abdominal pain: Secondary | ICD-10-CM

## 2013-02-09 MED ORDER — IOHEXOL 300 MG/ML  SOLN
75.0000 mL | Freq: Once | INTRAMUSCULAR | Status: AC | PRN
  Administered 2013-02-09: 75 mL via INTRAVENOUS

## 2013-02-23 ENCOUNTER — Ambulatory Visit (INDEPENDENT_AMBULATORY_CARE_PROVIDER_SITE_OTHER): Payer: Medicare Other | Admitting: Nurse Practitioner

## 2013-02-23 ENCOUNTER — Encounter: Payer: Self-pay | Admitting: Nurse Practitioner

## 2013-02-23 VITALS — BP 148/70 | HR 60 | Ht 62.0 in | Wt 114.4 lb

## 2013-02-23 DIAGNOSIS — I10 Essential (primary) hypertension: Secondary | ICD-10-CM

## 2013-02-23 LAB — BASIC METABOLIC PANEL
BUN: 24 mg/dL — ABNORMAL HIGH (ref 6–23)
CO2: 32 mEq/L (ref 19–32)
Calcium: 9.4 mg/dL (ref 8.4–10.5)
Chloride: 102 mEq/L (ref 96–112)
Creatinine, Ser: 0.9 mg/dL (ref 0.4–1.2)
GFR: 58.69 mL/min — ABNORMAL LOW (ref >60.00)
Glucose, Bld: 83 mg/dL (ref 70–99)
Potassium: 3.8 mEq/L (ref 3.5–5.1)
Sodium: 138 mEq/L (ref 135–145)

## 2013-02-23 NOTE — Patient Instructions (Addendum)
Let's stop the Atenolol again  Ok to use an Advil every now and then - just not routinely  We need to check labs today  Happy Birthday tomorrow!!!!!  Keep a check on her blood pressure - let us know if it is consistently staying above 170's.   I will see her in 6 months  Call the [facility] office at [phone] if you have any questions, problems or concerns. DANIELLE

## 2013-02-23 NOTE — Progress Notes (Signed)
[name] Date of Birth: 19-Jul-1914 Medical Record #[id number]  History of Present Illness: [name] is seen back today for a 6 month check. Seen for Dr. Elease Hashimoto. Former patient of Dr. Ronnald Nian. Has a remote history of syncope with a past loop record implant that turned out to be unremarkable. This was many years ago. Other issues include PAF, HTN, cerebrovascular disease, OA, hypothyroidism and advanced age.   Was admitted to the hospital in December 2013 with a recurrent syncopal spell. She had been cooking in the kitchen and got weak and went to sit down. She lost consciousness and was out for about 30 to 60 seconds, then regained consciousness. No seizure, HA, weakness, etc. EMS was called. BP was low (60's/40's). She had a sinus pause on EKG of about 2.5 seconds (daughter has brought in strips for review). She had been fairly active and had done 3 loads of laundry that day. Dr. Elease Hashimoto saw her and felt that this was orthostasis/dehydation. Her lasix was stopped. Her beta blocker was stopped. BP is to be more liberal and we are not to achieve aggressive control. She was given some IV fluids. Her discharge summary mentions atrial fib during that time but all of her EKGs that I see show sinus rhythm. His suggestion was to not be aggressive with her blood pressure control and to use support stockings.   Comes back today. Here with family. Has been doing ok. Was put back on her low dose beta blocker (atenolol 12.5 mg). Was put back on because she was having headaches - BP was elevated but we had already decided to NOT be aggressive with her BP. She has lots of arthritis. No more passing out spells. No chest pain. She is turning 89 tomorrow and planning on dusting and cleaning the bathrooms tomorrow. Apparently has had issues with a high potassium level - was told to cut back on dietary consumption - but she really "just eats like a bird".   Current Outpatient Prescriptions  Medication Sig Dispense  Refill  . aspirin 81 MG tablet Take 81 mg by mouth at bedtime.       . Cholecalciferol (VITAMIN D3) 1000 UNITS CAPS Take 1 capsule by mouth daily.       . Flaxseed, Linseed, (FLAX SEED OIL PO) Take 1 tablet by mouth 3 (three) times daily.       Marland Kitchen gabapentin (NEURONTIN) 100 MG capsule Take 100 mg by mouth daily. TWO TABLETS AT NIGHT      . magnesium 30 MG tablet Take 250 mg by mouth daily.      . Multiple Vitamins-Minerals (OCUVITE PRESERVISION PO) Take 1 tablet by mouth 2 (two) times daily.       . pravastatin (PRAVACHOL) 40 MG tablet Take 20 mg by mouth at bedtime.       . promethazine (PHENERGAN) 25 MG tablet Take 25 mg by mouth every 6 (six) hours as needed. For nausea      . SYNTHROID 75 MCG tablet TAKE 1 TABLET EVERY DAY  90 tablet  4  . traMADol (ULTRAM) 50 MG tablet Take 50 mg by mouth every 6 (six) hours as needed. For pain      . triamcinolone (KENALOG) 0.025 % cream Apply topically as needed.       No current facility-administered medications for this visit.    Allergies  Allergen Reactions  . Ciprocin-Fluocin-Procin [Fluocinolone Acetonide] Nausea And Vomiting  . Codeine Hives and Nausea And Vomiting  .  Sulfa Drugs Cross Reactors Hives and Nausea And Vomiting    Past Medical History  Diagnosis Date  . Syncope and collapse     Negative loop recorder in the past  . Hypertension   . AV block, 1st degree   . CVD (cerebrovascular disease)     History of CVA  . Osteoarthritis   . Hypothyroidism   . Hypokalemia   . Diarrhea   . Atrial fibrillation     not a candidate for coumadin due to falls  . Pneumonia 09/11  . Traumatic enucleation of eye     left eye  . History of echocardiogram 9/11    normal EF, mild to mod MR & TR, mod pulmonary HTN  . Varicose veins   . Advanced age   . GERD (gastroesophageal reflux disease)     Past Surgical History  Procedure Laterality Date  . Back surgery    . Inguinal hernia repair      left sided    History  Smoking status  .  Never Smoker   Smokeless tobacco  . Not on file    History  Alcohol Use No    Family History  Problem Relation Age of Onset  . Heart attack Father   . Heart attack Brother   . Cancer Brother   . Stroke Brother     Review of Systems: The review of systems is per the HPI.  All other systems were reviewed and are negative.  Physical Exam: BP 148/70  Pulse 60  Ht 5\' 2"  (1.575 m)  Wt 114 lb 6.4 oz (51.891 kg)  BMI 20.92 kg/m2 Patient is very pleasant and in no acute distress. Hard of hearing. Looks younger than her stated age. Skin is warm and dry. Color is normal.  HEENT is unremarkable. Normocephalic/atraumatic. PERRL. Sclera are nonicteric. Neck is supple. No masses. No JVD. Lungs are clear. Cardiac exam shows an irregular rhythm. Abdomen is soft. Extremities are without edema. Gait and ROM are intact. No gross neurologic deficits noted.  LABORATORY DATA:  Lab Results  Component Value Date   WBC 7.9 06/02/2012   HGB 11.6* 06/02/2012   HCT 34.4* 06/02/2012   PLT 280.0 06/02/2012   GLUCOSE 87 06/02/2012   ALT 20 05/24/2012   AST 30 05/24/2012   NA 139 06/02/2012   K 4.4 06/02/2012   CL 105 06/02/2012   CREATININE 1.2 06/02/2012   BUN 23 06/02/2012   CO2 27 06/02/2012   TSH 0.812 05/25/2012   INR 0.94 02/06/2011     Assessment / Plan: 1. HTN - I think her blood pressure is acceptable.   2. Headaches - I bet this was more related to her arthritis.   3. Syncope - I do not think her being on the beta blocker is a good idea - I am stopping it. We will not be trying to achieve goal BP.   4. Hyperkalemia - recheck labs today.   I think she is doing well overall. Turning 89 tomorrow. I hope to see her back in 6 months.   Patient is agreeable to this plan and will call if any problems develop in the interim.   Rosalio Macadamia, RN, ANP-C [facility] [address] [place], [place]  [address]

## 2013-05-23 ENCOUNTER — Ambulatory Visit (INDEPENDENT_AMBULATORY_CARE_PROVIDER_SITE_OTHER): Payer: Medicare Other | Admitting: Internal Medicine

## 2013-05-23 ENCOUNTER — Encounter: Payer: Self-pay | Admitting: Internal Medicine

## 2013-05-23 ENCOUNTER — Ambulatory Visit: Payer: Self-pay | Admitting: Emergency Medicine

## 2013-05-23 VITALS — BP 158/66 | HR 88 | Temp 99.3°F | Resp 18 | Wt 109.0 lb

## 2013-05-23 DIAGNOSIS — J019 Acute sinusitis, unspecified: Secondary | ICD-10-CM

## 2013-05-23 DIAGNOSIS — J0410 Acute tracheitis without obstruction: Secondary | ICD-10-CM

## 2013-05-23 MED ORDER — PREDNISONE 20 MG PO TABS: ORAL_TABLET | ORAL | Status: DC

## 2013-05-23 MED ORDER — AZITHROMYCIN 250 MG PO TABS: ORAL_TABLET | ORAL | Status: DC

## 2013-05-23 MED ORDER — AZITHROMYCIN 250 MG PO TABS: ORAL_TABLET | ORAL | Status: AC

## 2013-05-23 MED ORDER — PROMETHAZINE-DM 6.25-15 MG/5ML PO SYRP: ORAL_SOLUTION | ORAL | Status: DC

## 2013-05-23 NOTE — Patient Instructions (Signed)
Sinusitis Sinusitis is redness, soreness, and swelling (inflammation) of the paranasal sinuses. Paranasal sinuses are air pockets within the bones of your face (beneath the eyes, the middle of the forehead, or above the eyes). In healthy paranasal sinuses, mucus is able to drain out, and air is able to circulate through them by way of your nose. However, when your paranasal sinuses are inflamed, mucus and air can become trapped. This can allow bacteria and other germs to grow and cause infection. Sinusitis can develop quickly and last only a short time (acute) or continue over a long period (chronic). Sinusitis that lasts for more than 12 weeks is considered chronic.  CAUSES  Causes of sinusitis include:  Allergies.  Structural abnormalities, such as displacement of the cartilage that separates your nostrils (deviated septum), which can decrease the air flow through your nose and sinuses and affect sinus drainage.  Functional abnormalities, such as when the small hairs (cilia) that line your sinuses and help remove mucus do not work properly or are not present. SYMPTOMS  Symptoms of acute and chronic sinusitis are the same. The primary symptoms are pain and pressure around the affected sinuses. Other symptoms include:  Upper toothache.  Earache.  Headache.  Bad breath.  Decreased sense of smell and taste.  A cough, which worsens when you are lying flat.  Fatigue.  Fever.  Thick drainage from your nose, which often is green and may contain pus (purulent).  Swelling and warmth over the affected sinuses. DIAGNOSIS  Your caregiver will perform a physical exam. During the exam, your caregiver may:  Look in your nose for signs of abnormal growths in your nostrils (nasal polyps).  Tap over the affected sinus to check for signs of infection.  View the inside of your sinuses (endoscopy) with a special imaging device with a light attached (endoscope), which is inserted into your  sinuses. If your caregiver suspects that you have chronic sinusitis, one or more of the following tests may be recommended:  Allergy tests.  Nasal culture A sample of mucus is taken from your nose and sent to a lab and screened for bacteria.  Nasal cytology A sample of mucus is taken from your nose and examined by your caregiver to determine if your sinusitis is related to an allergy. TREATMENT  Most cases of acute sinusitis are related to a viral infection and will resolve on their own within 10 days. Sometimes medicines are prescribed to help relieve symptoms (pain medicine, decongestants, nasal steroid sprays, or saline sprays).  However, for sinusitis related to a bacterial infection, your caregiver will prescribe antibiotic medicines. These are medicines that will help kill the bacteria causing the infection.  Rarely, sinusitis is caused by a fungal infection. In theses cases, your caregiver will prescribe antifungal medicine. For some cases of chronic sinusitis, surgery is needed. Generally, these are cases in which sinusitis recurs more than 3 times per year, despite other treatments. HOME CARE INSTRUCTIONS   Drink plenty of water. Water helps thin the mucus so your sinuses can drain more easily.  Use a humidifier.  Inhale steam 3 to 4 times a day (for example, sit in the bathroom with the shower running).  Apply a warm, moist washcloth to your face 3 to 4 times a day, or as directed by your caregiver.  Use saline nasal sprays to help moisten and clean your sinuses.  Take over-the-counter or prescription medicines for pain, discomfort, or fever only as directed by your caregiver. SEEK IMMEDIATE MEDICAL   CARE IF:  You have increasing pain or severe headaches.  You have nausea, vomiting, or drowsiness.  You have swelling around your face.  You have vision problems.  You have a stiff neck.  You have difficulty breathing. MAKE SURE YOU:   Understand these  instructions.  Will watch your condition.  Will get help right away if you are not doing well or get worse. Document Released: 05/12/2005 Document Revised: 08/04/2011 Document Reviewed: 05/27/2011 Exit[employer] Patient Information 2014 [employer].   Bronchitis Bronchitis is the body's way of reacting to injury and/or infection (inflammation) of the bronchi. Bronchi are the air tubes that extend from the windpipe into the lungs. If the inflammation becomes severe, it may cause shortness of breath. CAUSES  Inflammation may be caused by:  A virus.  Germs (bacteria).  Dust.  Allergens.  Pollutants and many other irritants. The cells lining the bronchial tree are covered with tiny hairs (cilia). These constantly beat upward, away from the lungs, toward the mouth. This keeps the lungs free of pollutants. When these cells become too irritated and are unable to do their job, mucus begins to develop. This causes the characteristic cough of bronchitis. The cough clears the lungs when the cilia are unable to do their job. Without either of these protective mechanisms, the mucus would settle in the lungs. Then you would develop pneumonia. Smoking is a common cause of bronchitis and can contribute to pneumonia. Stopping this habit is the single most important thing you can do to help yourself. TREATMENT   Your caregiver may prescribe an antibiotic if the cough is caused by bacteria. Also, medicines that open up your airways make it easier to breathe. Your caregiver may also recommend or prescribe an expectorant. It will loosen the mucus to be coughed up. Only take over-the-counter or prescription medicines for pain, discomfort, or fever as directed by your caregiver.  Removing whatever causes the problem (smoking, for example) is critical to preventing the problem from getting worse.  Cough suppressants may be prescribed for relief of cough symptoms.  Inhaled medicines may be prescribed to help  with symptoms now and to help prevent problems from returning.  For those with recurrent (chronic) bronchitis, there may be a need for steroid medicines. SEEK IMMEDIATE MEDICAL CARE IF:   During treatment, you develop more pus-like mucus (purulent sputum).  You have a fever.  You become progressively more ill.  You have increased difficulty breathing, wheezing, or shortness of breath. It is necessary to seek immediate medical care if you are elderly or sick from any other disease. MAKE SURE YOU:   Understand these instructions.  Will watch your condition.  Will get help right away if you are not doing well or get worse. Document Released: 05/12/2005 Document Revised: 01/12/2013 Document Reviewed: 01/04/2013 Exit[employer] Patient Information 2014 [employer].  

## 2013-05-23 NOTE — Progress Notes (Deleted)
Patient ID: [name], female   DOB: [birth year], 79 y.o.   MRN: [id number] [id number].Marland Kitchen

## 2013-05-23 NOTE — Progress Notes (Signed)
Subjective:    Patient ID: MCCARTNEY BRUCKS, female    DOB: January 12, 1915, 79 y.o.   MRN: [id number]  Cough This is a new problem. The current episode started in the past 7 days. The problem has been gradually worsening. The problem occurs constantly. The cough is productive of purulent sputum. Associated symptoms include chills, ear congestion, ear pain, a fever, headaches, myalgias, nasal congestion, postnasal drip, rhinorrhea, a sore throat and sweats. Pertinent negatives include no rash, shortness of breath or wheezing. Her past medical history is significant for bronchitis and COPD. There is no history of asthma.  Sinusitis Associated symptoms include chills, congestion, coughing, ear pain, headaches, sinus pressure and a sore throat. Pertinent negatives include no diaphoresis or shortness of breath.      Review of Systems  Constitutional: Positive for fever and chills. Negative for diaphoresis.  HENT: Positive for congestion, ear pain, postnasal drip, rhinorrhea, sinus pressure and sore throat. Negative for mouth sores, nosebleeds, tinnitus and trouble swallowing.   Eyes: Negative.   Respiratory: Positive for cough and chest tightness. Negative for choking, shortness of breath, wheezing and stridor.   Cardiovascular: Negative.   Gastrointestinal: Negative.   Endocrine: Negative.   Genitourinary: Negative.   Musculoskeletal: Positive for myalgias.  Skin: Negative for rash.  Neurological: Positive for headaches.   Current Outpatient Prescriptions on File Prior to Visit  Medication Sig Dispense Refill  . aspirin 81 MG tablet Take 81 mg by mouth at bedtime.       . Cholecalciferol (VITAMIN D3) 1000 UNITS CAPS Take 1,000 Units by mouth 2 (two) times daily.       . Flaxseed, Linseed, (FLAX SEED OIL PO) Take 1 tablet by mouth 3 (three) times daily.       . Multiple Vitamins-Minerals (OCUVITE PRESERVISION PO) Take 1 tablet by mouth 2 (two) times daily.       . promethazine (PHENERGAN) 25 MG  tablet Take 25 mg by mouth every 6 (six) hours as needed. For nausea      . SYNTHROID 75 MCG tablet TAKE 1 TABLET EVERY DAY  90 tablet  4  . traMADol (ULTRAM) 50 MG tablet Take 50 mg by mouth every 6 (six) hours as needed. For pain      . triamcinolone (KENALOG) 0.025 % cream Apply topically as needed.       No current facility-administered medications on file prior to visit.    Allergies  Allergen Reactions  . Ciprocin-Fluocin-Procin [Fluocinolone Acetonide] Nausea And Vomiting  . Codeine Hives and Nausea And Vomiting  . Sulfa Drugs Cross Reactors Hives and Nausea And Vomiting   Past Medical History  Diagnosis Date  . Syncope and collapse     Negative loop recorder in the past  . Hypertension   . AV block, 1st degree   . CVD (cerebrovascular disease)     History of CVA  . Osteoarthritis   . Hypothyroidism   . Hypokalemia   . Diarrhea   . Atrial fibrillation     not a candidate for coumadin due to falls  . Pneumonia 09/11  . Traumatic enucleation of eye     left eye  . History of echocardiogram 9/11    normal EF, mild to mod MR & TR, mod pulmonary HTN  . Varicose veins   . Advanced age   . GERD (gastroesophageal reflux disease)          Objective:   Physical Exam  Constitutional: She is oriented to person, place,  and time. She appears well-nourished.  HENT:  Head: Normocephalic and atraumatic.  Right Ear: External ear normal.  Left Ear: External ear normal.  Nose: Nose normal.  Mouth/Throat: Oropharynx is clear and moist. No oropharyngeal exudate.  Bilat sinus tenderness  Eyes: EOM are normal. Pupils are equal, round, and reactive to light. Right eye exhibits no discharge. Left eye exhibits discharge.  Neck: Normal range of motion.  Cardiovascular: Normal rate and regular rhythm.   Murmur heard. Pulmonary/Chest: Effort normal. No respiratory distress. She has wheezes. She has rales. She exhibits no tenderness.  Abdominal: Soft.  Musculoskeletal: Normal range of  motion. She exhibits no edema.  Neurological: She is alert and oriented to person, place, and time. No cranial nerve deficit. Coordination normal.  Skin: Skin is warm and dry. No rash noted. No erythema. No pallor.  Psychiatric: She has a normal mood and affect.          Assessment & Plan:  1. Acute tracheitis without mention of obstruction  - predniSONE (DELTASONE) 20 MG tablet; 1 tab 3 x day for 2 days, then 1 tab 2 x day for 2 days, then 1 tab 1 x day for 3 days  Dispense: 13 tablet; Refill: 0 - promethazine-dextromethorphan (PROMETHAZINE-DM) 6.25-15 MG/5ML syrup; Take 1 to 2 teaspoons 4 x daily or every 4 hours for cough or congestion  Dispense: 240 mL; Refill: 1 - azithromycin (ZITHROMAX) 250 MG tablet; Take 2 tablets (500 mg) on  Day 1,  followed by 1 tablet (250 mg) once daily on Days 2 through 5.  Dispense: 6 each; Refill: 1  2. Acute sinusitis, unspecified

## 2013-05-30 ENCOUNTER — Other Ambulatory Visit: Payer: Self-pay | Admitting: *Deleted

## 2013-05-30 ENCOUNTER — Telehealth: Payer: Self-pay | Admitting: Nurse Practitioner

## 2013-05-30 MED ORDER — FUROSEMIDE 20 MG PO TABS
10.0000 mg | ORAL_TABLET | ORAL | Status: DC | PRN
Start: 2013-05-30 — End: 2013-05-30

## 2013-05-30 MED ORDER — FUROSEMIDE 20 MG PO TABS: 10.0000 mg | ORAL_TABLET | ORAL | Status: DC | PRN

## 2013-05-30 NOTE — Telephone Encounter (Signed)
S/w pt's son is agreeable to plan to take ( 10 Mg) of lasix one half tablet if systolic ( top number of bp) is greater than 175. I sent in script to [facility] on [address]

## 2013-05-30 NOTE — Telephone Encounter (Signed)
?  too much salt from the holidays?? May try Lasix 20 mg but only a half a tablet (10mg ) on the days her BP is over 175 systolic (the top number)

## 2013-05-30 NOTE — Telephone Encounter (Signed)
New Problem:  Pt's son states his mom's BP has been high....   12/30    187/82 12/31    165/81 1/1        169/82 1/2        162/77 1/3        183/84 1/4        180/85

## 2013-06-26 DIAGNOSIS — R7303 Prediabetes: Secondary | ICD-10-CM | POA: Insufficient documentation

## 2013-06-26 DIAGNOSIS — K219 Gastro-esophageal reflux disease without esophagitis: Secondary | ICD-10-CM | POA: Insufficient documentation

## 2013-06-29 ENCOUNTER — Ambulatory Visit (INDEPENDENT_AMBULATORY_CARE_PROVIDER_SITE_OTHER): Payer: Medicare Other | Admitting: Internal Medicine

## 2013-06-29 ENCOUNTER — Encounter: Payer: Self-pay | Admitting: Internal Medicine

## 2013-06-29 VITALS — BP 152/76 | HR 96 | Temp 98.8°F | Resp 16 | Wt 108.6 lb

## 2013-06-29 DIAGNOSIS — R7309 Other abnormal glucose: Secondary | ICD-10-CM

## 2013-06-29 DIAGNOSIS — E559 Vitamin D deficiency, unspecified: Secondary | ICD-10-CM

## 2013-06-29 DIAGNOSIS — I10 Essential (primary) hypertension: Secondary | ICD-10-CM

## 2013-06-29 DIAGNOSIS — E782 Mixed hyperlipidemia: Secondary | ICD-10-CM | POA: Insufficient documentation

## 2013-06-29 DIAGNOSIS — Z79899 Other long term (current) drug therapy: Secondary | ICD-10-CM

## 2013-06-29 DIAGNOSIS — R7303 Prediabetes: Secondary | ICD-10-CM

## 2013-06-29 LAB — CBC WITH DIFFERENTIAL/PLATELET
BASOS ABS: 0.1 10*3/uL (ref 0.0–0.1)
Basophils Relative: 1 % (ref 0–1)
Eosinophils Absolute: 0.9 10*3/uL — ABNORMAL HIGH (ref 0.0–0.7)
Eosinophils Relative: 10 % — ABNORMAL HIGH (ref 0–5)
HEMATOCRIT: 38.8 % (ref 36.0–46.0)
Hemoglobin: 13.1 g/dL (ref 12.0–15.0)
LYMPHS ABS: 2.5 10*3/uL (ref 0.7–4.0)
LYMPHS PCT: 28 % (ref 12–46)
MCH: 31.4 pg (ref 26.0–34.0)
MCHC: 33.8 g/dL (ref 30.0–36.0)
MCV: 93 fL (ref 78.0–100.0)
MONO ABS: 1.4 10*3/uL — AB (ref 0.1–1.0)
Monocytes Relative: 15 % — ABNORMAL HIGH (ref 3–12)
NEUTROS ABS: 4.2 10*3/uL (ref 1.7–7.7)
Neutrophils Relative %: 46 % (ref 43–77)
Platelets: 366 10*3/uL (ref 150–400)
RBC: 4.17 MIL/uL (ref 3.87–5.11)
RDW: 13.2 % (ref 11.5–15.5)
WBC: 9.1 10*3/uL (ref 4.0–10.5)

## 2013-06-29 LAB — HEMOGLOBIN A1C
HEMOGLOBIN A1C: 6.1 % — AB (ref <5.7)
MEAN PLASMA GLUCOSE: 128 mg/dL — AB (ref <117)

## 2013-06-29 MED ORDER — LISINOPRIL 20 MG PO TABS: ORAL_TABLET | ORAL | Status: DC

## 2013-06-29 NOTE — Progress Notes (Signed)
Patient ID: [name], female   DOB: 08/17/1914, 80 y.o.   MRN: [id number]   This very nice 80 y.o. [name] presents for 3 month follow up with Hypertension, Hyperlipidemia, Hx CVA, Pre-Diabetes and Vitamin D Deficiency.    BP has been reported sl elevated in the evenings at home with her sys BP's inth erange of 160-170. at home. Today's BP: 152/76 mmHg. She has moderate CKD with calc GFR of 42 in Oct 2014. Patient denies any cardiac type chest pain, palpitations, dyspnea/orthopnea/PND, dizziness, claudication, or dependent edema.   Hyperlipidemia is controlled with diet & meds. Last Cholesterol was 198, Triglycerides were  118, HDL 65 and LDL 109 in Oct [id number]. Patient denies myalgias or other med SE's.    Also, the patient has history of PreDiabetes with last A1c of 5.5% in June. Patient denies any symptoms of reactive hypoglycemia, diabetic polys, paresthesias or visual blurring.   Further, Patient has history of Vitamin D Deficiency with last vitamin D of 67 in Oct 2014. Patient supplements vitamin D without any suspected side-effects.    Medication List         aspirin 81 MG tablet  Take 81 mg by mouth at bedtime.     FLAX SEED OIL PO  Take 1 tablet by mouth 3 (three) times daily.     furosemide 20 MG tablet  Commonly known as:  LASIX  Take 0.5 tablets (10 mg total) by mouth as needed (take (10 mg ) if top number of bp is greater than 175).     lisinopril 20 MG tablet  Commonly known as:  PRINIVIL,ZESTRIL  1/2 to 1 tablet every morning as directed for BP     MAGNESIUM OXIDE PO  Take 250 mg by mouth daily.     OCUVITE PRESERVISION PO  Take 1 tablet by mouth 2 (two) times daily.     promethazine 25 MG tablet  Commonly known as:  PHENERGAN  Take 25 mg by mouth every 6 (six) hours as needed. For nausea     SYNTHROID 75 MCG tablet  Generic drug:  levothyroxine  TAKE 1 TABLET EVERY DAY     traMADol 50 MG tablet  Commonly known as:  ULTRAM  Take 50 mg by mouth every 6 (six)  hours as needed. For pain     triamcinolone 0.025 % cream  Commonly known as:  KENALOG  Apply topically as needed.     Vitamin D3 1000 UNITS Caps  Take 1,000 Units by mouth 2 (two) times daily.         Allergies  Allergen Reactions  . Ciprocin-Fluocin-Procin [Fluocinolone Acetonide] Nausea And Vomiting  . Codeine Hives and Nausea And Vomiting  . Sulfa Drugs Cross Reactors Hives and Nausea And Vomiting    PMHx:   Past Medical History  Diagnosis Date  . Syncope and collapse     Negative loop recorder in the past  . Hypertension   . AV block, 1st degree   . CVD (cerebrovascular disease)     History of CVA  . Hypokalemia   . Diarrhea   . Atrial fibrillation     not a candidate for coumadin due to falls  . Pneumonia 09/11  . Traumatic enucleation of eye     left eye  . History of echocardiogram 9/11    normal EF, mild to mod MR & TR, mod pulmonary HTN  . Varicose veins   . Advanced age   . Prediabetes   . Hypothyroidism   .  GERD (gastroesophageal reflux disease)   . Osteoarthritis     FHx:    Reviewed / unchanged  SHx:    Reviewed / unchanged  Systems Review: Constitutional: Denies fever, chills, wt changes, headaches, insomnia, fatigue, night sweats, change in appetite. Eyes: Denies redness, blurred vision, diplopia, discharge, itchy, watery eyes.  ENT: Denies discharge, congestion, post nasal drip, epistaxis, sore throat, earache, hearing loss, dental pain, tinnitus, vertigo, sinus pain, snoring.  CV: Denies chest pain, palpitations, irregular heartbeat, syncope, dyspnea, diaphoresis, orthopnea, PND, claudication, edema. Respiratory: denies cough, dyspnea, DOE, pleurisy, hoarseness, laryngitis, wheezing.  Gastrointestinal: Denies dysphagia, odynophagia, heartburn, reflux, water brash, abdominal pain or cramps, nausea, vomiting, bloating, diarrhea, constipation, hematemesis, melena, hematochezia,  or hemorrhoids. Genitourinary: Denies dysuria, frequency, urgency,  nocturia, hesitancy, discharge, hematuria, flank pain. Musculoskeletal: Denies arthralgias, myalgias, stiffness, jt. swelling, pain, limp, strain/sprain.  Skin: Denies pruritus, rash, hives, warts, acne, eczema, change in skin lesion(s). Neuro: No weakness, tremor, incoordination, spasms, paresthesia, or pain. Psychiatric: Denies confusion, memory loss, or sensory loss. Endo: Denies change in weight, skin, hair change.  Heme/Lymph: No excessive bleeding, bruising, orenlarged lymph nodes.  Filed Vitals:   06/29/13 1448  BP: 152/76  Pulse: 96  Temp: 98.8 F (37.1 C)  Resp: 16    Estimated body mass index is 19.86 kg/(m^2) as calculated from the following:   Height as of 02/23/13: 5\' 2"  (1.575 m).   Weight as of this encounter: 108 lb 9.6 oz (49.261 kg).  On Exam: Appears well nourished - in no distress. Eyes: PERRLA, EOMs, conjunctiva no swelling or erythema. Sinuses: No frontal/maxillary tenderness ENT/Mouth: EAC's clear, TM's nl w/o erythema, bulging. Nares clear w/o erythema, swelling, exudates. Oropharynx clear without erythema or exudates. Oral hygiene is good. Tongue normal, non obstructing. Hearing intact.  Neck: Supple. Thyroid nl. Car 2+/2+ without bruits, nodes or JVD. Chest: Respirations nl with BS clear & equal w/o rales, rhonchi, wheezing or stridor.  Cor: Heart sounds normal w/ regular rate and rhythm without sig. murmurs, gallops, clicks, or rubs. Peripheral pulses normal and equal  without edema.  Abdomen: Soft & bowel sounds normal. Non-tender w/o guarding, rebound, hernias, masses, or organomegaly.  Lymphatics: Unremarkable.  Musculoskeletal: Full ROM all peripheral extremities, joint stability, 5/5 strength, and normal gait.  Skin: Warm, dry without exposed rashes, lesions, ecchymosis apparent.  Neuro: Cranial nerves intact, reflexes equal bilaterally. Sensory-motor testing grossly intact. Tendon reflexes grossly intact.  Pysch: Alert & oriented x 3. Insight and  judgement nl & appropriate. No ideations.  Assessment and Plan:  1. Hypertension - Continue monitor blood pressure at home. Continue diet/meds same.  2. Hyperlipidemia - Continue diet/meds, exercise,& lifestyle modifications. Continue monitor periodic cholesterol/liver & renal functions   3. Pre-diabetes/Insulin Resistance - Continue diet, exercise, lifestyle modifications. Monitor appropriate labs.  4. Vitamin D Deficiency - Continue supplementation.  Recommended regular exercise, BP monitoring, weight control, and discussed med and SE's. Recommended labs to assess and monitor clinical status. Further disposition pending results of labs.

## 2013-06-29 NOTE — Patient Instructions (Addendum)

## 2013-06-30 LAB — BASIC METABOLIC PANEL WITH GFR
BUN: 22 mg/dL (ref 6–23)
CHLORIDE: 103 meq/L (ref 96–112)
CO2: 27 meq/L (ref 19–32)
Calcium: 10.1 mg/dL (ref 8.4–10.5)
Creat: 0.94 mg/dL (ref 0.50–1.10)
GFR, EST NON AFRICAN AMERICAN: 51 mL/min — AB
GFR, Est African American: 59 mL/min — ABNORMAL LOW
Glucose, Bld: 89 mg/dL (ref 70–99)
POTASSIUM: 5 meq/L (ref 3.5–5.3)
Sodium: 141 mEq/L (ref 135–145)

## 2013-06-30 LAB — HEPATIC FUNCTION PANEL
ALK PHOS: 62 U/L (ref 39–117)
ALT: 17 U/L (ref 0–35)
AST: 28 U/L (ref 0–37)
Albumin: 4.4 g/dL (ref 3.5–5.2)
BILIRUBIN INDIRECT: 0.3 mg/dL (ref 0.2–1.2)
BILIRUBIN TOTAL: 0.4 mg/dL (ref 0.2–1.2)
Bilirubin, Direct: 0.1 mg/dL (ref 0.0–0.3)
Total Protein: 6.9 g/dL (ref 6.0–8.3)

## 2013-06-30 LAB — TSH: TSH: 0.817 u[IU]/mL (ref 0.350–4.500)

## 2013-06-30 LAB — INSULIN, FASTING: Insulin fasting, serum: 12 u[IU]/mL (ref 3–28)

## 2013-06-30 LAB — MAGNESIUM: Magnesium: 2 mg/dL (ref 1.5–2.5)

## 2013-06-30 LAB — VITAMIN D 25 HYDROXY (VIT D DEFICIENCY, FRACTURES): VIT D 25 HYDROXY: 65 ng/mL (ref 30–89)

## 2013-08-10 ENCOUNTER — Other Ambulatory Visit: Payer: Self-pay | Admitting: Cardiology

## 2013-09-20 ENCOUNTER — Ambulatory Visit (INDEPENDENT_AMBULATORY_CARE_PROVIDER_SITE_OTHER): Payer: Medicare Other | Admitting: Nurse Practitioner

## 2013-09-20 ENCOUNTER — Encounter: Payer: Self-pay | Admitting: Nurse Practitioner

## 2013-09-20 VITALS — BP 160/80 | HR 93 | Ht 62.0 in | Wt 104.1 lb

## 2013-09-20 DIAGNOSIS — I10 Essential (primary) hypertension: Secondary | ICD-10-CM

## 2013-09-20 DIAGNOSIS — R55 Syncope and collapse: Secondary | ICD-10-CM

## 2013-09-20 NOTE — Patient Instructions (Addendum)
Stay on your current medicines  Stay safe!!  I will see you in 6 months  Call the [facility] office at [phone] if you have any questions, problems or concerns.

## 2013-09-20 NOTE — Progress Notes (Signed)
Cecilie LowersAlma M Bene Date of Birth: 08/03/1914 Medical Record #[id number]  History of Present Illness: [name] is seen back today for a 6 month check. Seen for Dr. Elease HashimotoNahser. Former patient of Dr. Ronnald Nianennant's. Has a remote history of syncope with a past loop record implant that turned out to be unremarkable. This was many years ago. Other issues include PAF, first degree AV block, HTN, cerebrovascular disease, OA, hypothyroidism and advanced age.   Was admitted to the hospital in December 2013 with a recurrent syncopal spell. She had been cooking in the kitchen and got weak and went to sit down. She lost consciousness and was out for about 30 to 60 seconds, then regained consciousness. No seizure, HA, weakness, etc. EMS was called. BP was low (60's/40's). She had a sinus pause on EKG of about 2.5 seconds (daughter has brought in strips for review). She had been fairly active and had done 3 loads of laundry that day. Dr. Elease HashimotoNahser saw her and felt that this was orthostasis/dehydation. Her lasix was stopped. Her beta blocker was stopped. BP is to be more liberal and we are not to achieve aggressive control. She was given some IV fluids. Her discharge summary mentions atrial fib during that time but all of her EKGs that I see show sinus rhythm. His suggestion was to not be aggressive with her blood pressure control and to use support stockings.   Last seen back in October. Was doing ok for the most part. Had been put back on her low dose beta blocker (atenolol 12.5 mg) due to having headaches and I stopped this at my last visit with her - I felt her headaches were more related to arthritis. Called in early January with elevated BP - added back 10 mg of Lasix just if systolic above 175.   Comes back today. Here with family. Has been doing ok. Tells me she "can't do anything". Has to stop and rest. Really remains very active for her age. Was ironing this morning.Still cooking. Almost fell yesterday - caught her self in a  doorway. Was not dizzy - sounds like she lost her balance. She was turning around. No chest pain. Breathing ok. BP 130 to 170 at home. Really not using the prn Lasix.   Current Outpatient Prescriptions  Medication Sig Dispense Refill  . aspirin 81 MG tablet Take 81 mg by mouth at bedtime.       . Cholecalciferol (VITAMIN D3) 1000 UNITS CAPS Take 1,000 Units by mouth 2 (two) times daily.       . Flaxseed, Linseed, (FLAX SEED OIL PO) Take 1 tablet by mouth 3 (three) times daily.       . furosemide (LASIX) 20 MG tablet Take 0.5 tablets (10 mg total) by mouth as needed (take (10 mg ) if top number of bp is greater than 175).  15 tablet  0  . MAGNESIUM OXIDE PO Take 250 mg by mouth daily.      . Multiple Vitamins-Minerals (OCUVITE PRESERVISION PO) Take 1 tablet by mouth 2 (two) times daily.       . promethazine (PHENERGAN) 25 MG tablet Take 25 mg by mouth every 6 (six) hours as needed. For nausea      . SYNTHROID 75 MCG tablet TAKE 1 TABLET EVERY DAY  90 tablet  0  . traMADol (ULTRAM) 50 MG tablet Take 50 mg by mouth every 6 (six) hours as needed. For pain      . triamcinolone (KENALOG) 0.025 % cream  Apply topically as needed.       No current facility-administered medications for this visit.    Allergies  Allergen Reactions  . Ciprocin-Fluocin-Procin [Fluocinolone Acetonide] Nausea And Vomiting  . Codeine Hives and Nausea And Vomiting  . Sulfa Drugs Cross Reactors Hives and Nausea And Vomiting    Past Medical History  Diagnosis Date  . Syncope and collapse     Negative loop recorder in the past  . Hypertension   . AV block, 1st degree   . CVD (cerebrovascular disease)     History of CVA  . Hypokalemia   . Diarrhea   . Atrial fibrillation     not a candidate for coumadin due to falls  . Pneumonia 09/11  . Traumatic enucleation of eye     left eye  . History of echocardiogram 9/11    normal EF, mild to mod MR & TR, mod pulmonary HTN  . Varicose veins   . Advanced age   .  Prediabetes   . Hypothyroidism   . GERD (gastroesophageal reflux disease)   . Osteoarthritis     Past Surgical History  Procedure Laterality Date  . Back surgery    . Inguinal hernia repair      left sided    History  Smoking status  . Never Smoker   Smokeless tobacco  . Not on file    History  Alcohol Use No    Family History  Problem Relation Age of Onset  . Heart attack Father   . Heart attack Brother   . Cancer Brother   . Stroke Brother     Review of Systems: The review of systems is per the HPI.  All other systems were reviewed and are negative.  Physical Exam: BP 160/80  Pulse 93  Ht 5\' 2"  (1.575 m)  Wt 104 lb 1.9 oz (47.229 kg)  BMI 19.04 kg/m2  SpO2 100% Patient is very pleasant and in no acute distress. She does seem to be more hard of hearing.  Skin is warm and dry. Color is normal.  HEENT is unremarkable except for her eye deformity. Normocephalic/atraumatic. PERRL. Sclera are nonicteric. Neck is supple. No masses. No JVD. Lungs are clear. Cardiac exam shows a fairly regular rate and rhythm today. Abdomen is soft. Extremities are without edema. Gait and ROM are intact. No gross neurologic deficits noted.  LABORATORY DATA:  Lab Results  Component Value Date   WBC 9.1 06/29/2013   HGB 13.1 06/29/2013   HCT 38.8 06/29/2013   PLT 366 06/29/2013   GLUCOSE 89 06/29/2013   ALT 17 06/29/2013   AST 28 06/29/2013   NA 141 06/29/2013   K 5.0 06/29/2013   CL 103 06/29/2013   CREATININE 0.94 06/29/2013   BUN 22 06/29/2013   CO2 27 06/29/2013   TSH 0.817 06/29/2013   INR 0.94 02/06/2011   HGBA1C 6.1* 06/29/2013     Assessment / Plan: 1. HTN - I think her blood pressure is acceptable. Continue with our current plan of not achieving aggressive BP control. No change in her current regimen. See her back in 6 months.   2. Headaches - does not seem to be an issue at this time.   3. Syncope - not recurred.   Patient is agreeable to this plan and will call if any problems develop in  the interim.   Rosalio MacadamiaLori C. Lilyona Richner, RN, ANP-C [facility] [address] [place], [place]  [id number] [phone]     

## 2013-09-27 ENCOUNTER — Ambulatory Visit (INDEPENDENT_AMBULATORY_CARE_PROVIDER_SITE_OTHER): Payer: Medicare Other | Admitting: Physician Assistant

## 2013-09-27 ENCOUNTER — Encounter: Payer: Self-pay | Admitting: Physician Assistant

## 2013-09-27 VITALS — BP 110/58 | HR 80 | Temp 98.3°F | Resp 16 | Wt 103.0 lb

## 2013-09-27 DIAGNOSIS — Z1331 Encounter for screening for depression: Secondary | ICD-10-CM

## 2013-09-27 DIAGNOSIS — K219 Gastro-esophageal reflux disease without esophagitis: Secondary | ICD-10-CM

## 2013-09-27 DIAGNOSIS — E039 Hypothyroidism, unspecified: Secondary | ICD-10-CM

## 2013-09-27 DIAGNOSIS — E782 Mixed hyperlipidemia: Secondary | ICD-10-CM

## 2013-09-27 DIAGNOSIS — R7309 Other abnormal glucose: Secondary | ICD-10-CM

## 2013-09-27 DIAGNOSIS — Z79899 Other long term (current) drug therapy: Secondary | ICD-10-CM

## 2013-09-27 DIAGNOSIS — Z0000 Encounter for general adult medical examination without abnormal findings: Secondary | ICD-10-CM

## 2013-09-27 DIAGNOSIS — I10 Essential (primary) hypertension: Secondary | ICD-10-CM

## 2013-09-27 DIAGNOSIS — R7303 Prediabetes: Secondary | ICD-10-CM

## 2013-09-27 DIAGNOSIS — R609 Edema, unspecified: Secondary | ICD-10-CM

## 2013-09-27 DIAGNOSIS — B351 Tinea unguium: Secondary | ICD-10-CM

## 2013-09-27 LAB — HEMOGLOBIN A1C
Hgb A1c MFr Bld: 5.6 % (ref <5.7)
Mean Plasma Glucose: 114 mg/dL (ref <117)

## 2013-09-27 LAB — CBC WITH DIFFERENTIAL/PLATELET
BASOS ABS: 0.1 10*3/uL (ref 0.0–0.1)
Basophils Relative: 1 % (ref 0–1)
EOS PCT: 6 % — AB (ref 0–5)
Eosinophils Absolute: 0.4 10*3/uL (ref 0.0–0.7)
HCT: 37.7 % (ref 36.0–46.0)
Hemoglobin: 12.8 g/dL (ref 12.0–15.0)
LYMPHS PCT: 29 % (ref 12–46)
Lymphs Abs: 1.8 10*3/uL (ref 0.7–4.0)
MCH: 31.1 pg (ref 26.0–34.0)
MCHC: 34 g/dL (ref 30.0–36.0)
MCV: 91.5 fL (ref 78.0–100.0)
Monocytes Absolute: 0.9 10*3/uL (ref 0.1–1.0)
Monocytes Relative: 14 % — ABNORMAL HIGH (ref 3–12)
NEUTROS ABS: 3.2 10*3/uL (ref 1.7–7.7)
NEUTROS PCT: 50 % (ref 43–77)
PLATELETS: 312 10*3/uL (ref 150–400)
RBC: 4.12 MIL/uL (ref 3.87–5.11)
RDW: 12.8 % (ref 11.5–15.5)
WBC: 6.3 10*3/uL (ref 4.0–10.5)

## 2013-09-27 LAB — LIPID PANEL
CHOL/HDL RATIO: 3.2 ratio
Cholesterol: 185 mg/dL (ref 0–200)
HDL: 58 mg/dL (ref >39)
LDL Cholesterol: 96 mg/dL (ref 0–99)
Triglycerides: 155 mg/dL — ABNORMAL HIGH (ref <150)
VLDL: 31 mg/dL (ref 0–40)

## 2013-09-27 LAB — TSH: TSH: 0.771 u[IU]/mL (ref 0.350–4.500)

## 2013-09-27 LAB — BASIC METABOLIC PANEL WITH GFR
BUN: 17 mg/dL (ref 6–23)
CHLORIDE: 100 meq/L (ref 96–112)
CO2: 28 mEq/L (ref 19–32)
CREATININE: 1.09 mg/dL (ref 0.50–1.10)
Calcium: 9.7 mg/dL (ref 8.4–10.5)
GFR, EST NON AFRICAN AMERICAN: 43 mL/min — AB
GFR, Est African American: 50 mL/min — ABNORMAL LOW
Glucose, Bld: 124 mg/dL — ABNORMAL HIGH (ref 70–99)
POTASSIUM: 4.6 meq/L (ref 3.5–5.3)
SODIUM: 137 meq/L (ref 135–145)

## 2013-09-27 LAB — HEPATIC FUNCTION PANEL
ALBUMIN: 4.2 g/dL (ref 3.5–5.2)
ALK PHOS: 47 U/L (ref 39–117)
ALT: 18 U/L (ref 0–35)
AST: 28 U/L (ref 0–37)
BILIRUBIN TOTAL: 0.6 mg/dL (ref 0.2–1.2)
Bilirubin, Direct: 0.1 mg/dL (ref 0.0–0.3)
Indirect Bilirubin: 0.5 mg/dL (ref 0.2–1.2)
Total Protein: 6.5 g/dL (ref 6.0–8.3)

## 2013-09-27 LAB — MAGNESIUM: Magnesium: 1.9 mg/dL (ref 1.5–2.5)

## 2013-09-27 NOTE — Patient Instructions (Signed)
Do the welchol 1 packet in 6-8 oz of fluids, stir, let sit for 20 mins, then stir and drink- do it 3 times a week  Preventative Care for Adults - Female      MAINTAIN REGULAR HEALTH EXAMS:  A routine yearly physical is a good way to check in with your primary care provider about your health and preventive screening. It is also an opportunity to share updates about your health and any concerns you have, and receive a thorough all-over exam.   Most health insurance companies pay for at least some preventative services.  Check with your health plan for specific coverages.  WHAT PREVENTATIVE SERVICES DO WOMEN NEED?  Adult women should have their weight and blood pressure checked regularly.   Women age 78 and older should have their cholesterol levels checked regularly.  Women should be screened for cervical cancer with a Pap smear and pelvic exam beginning at either age 78, or 3 years after they become sexually activity.    Breast cancer screening generally begins at age 78 with a mammogram and breast exam by your primary care provider.    Beginning at age 78 and continuing to age 78, women should be screened for colorectal cancer.  Certain people may need continued testing until age 78.  Updating vaccinations is part of preventative care.  Vaccinations help protect against diseases such as the flu.  Osteoporosis is a disease in which the bones lose minerals and strength as we age. Women ages 8065 and over should discuss this with their caregivers, as should women after menopause who have other risk factors.  Lab tests are generally done as part of preventative care to screen for anemia and blood disorders, to screen for problems with the kidneys and liver, to screen for bladder problems, to check blood sugar, and to check your cholesterol level.  Preventative services generally include counseling about diet, exercise, avoiding tobacco, drugs, excessive alcohol consumption, and sexually  transmitted infections.    GENERAL RECOMMENDATIONS FOR GOOD HEALTH:  Healthy diet:  Eat a variety of foods, including fruit, vegetables, animal or vegetable protein, such as meat, fish, chicken, and eggs, or beans, lentils, tofu, and grains, such as rice.  Drink plenty of water daily.  Decrease saturated fat in the diet, avoid lots of red meat, processed foods, sweets, fast foods, and fried foods.  Exercise:  Aerobic exercise helps maintain good heart health. At least 30-40 minutes of moderate-intensity exercise is recommended. For example, a brisk walk that increases your heart rate and breathing. This should be done on most days of the week.   Find a type of exercise or a variety of exercises that you enjoy so that it becomes a part of your daily life.  Examples are running, walking, swimming, water aerobics, and biking.  For motivation and support, explore group exercise such as aerobic class, spin class, Zumba, Yoga,or  martial arts, etc.    Set exercise goals for yourself, such as a certain weight goal, walk or run in a race such as a 5k walk/run.  Speak to your primary care provider about exercise goals.  Disease prevention:  If you smoke or chew tobacco, find out from your caregiver how to quit. It can literally save your life, no matter how long you have been a tobacco user. If you do not use tobacco, never begin.   Maintain a healthy diet and normal weight. Increased weight leads to problems with blood pressure and diabetes.   The Body  Mass Index or BMI is a way of measuring how much of your body is fat. Having a BMI above 27 increases the risk of heart disease, diabetes, hypertension, stroke and other problems related to obesity. Your caregiver can help determine your BMI and based on it develop an exercise and dietary program to help you achieve or maintain this important measurement at a healthful level.  High blood pressure causes heart and blood vessel problems.  Persistent  high blood pressure should be treated with medicine if weight loss and exercise do not work.   Fat and cholesterol leaves deposits in your arteries that can block them. This causes heart disease and vessel disease elsewhere in your body.  If your cholesterol is found to be high, or if you have heart disease or certain other medical conditions, then you may need to have your cholesterol monitored frequently and be treated with medication.   Ask if you should have a cardiac stress test if your history suggests this. A stress test is a test done on a treadmill that looks for heart disease. This test can find disease prior to there being a problem.  Menopause can be associated with physical symptoms and risks. Hormone replacement therapy is available to decrease these. You should talk to your caregiver about whether starting or continuing to take hormones is right for you.   Osteoporosis is a disease in which the bones lose minerals and strength as we age. This can result in serious bone fractures. Risk of osteoporosis can be identified using a bone density scan. Women ages 565 and over should discuss this with their caregivers, as should women after menopause who have other risk factors. Ask your caregiver whether you should be taking a calcium supplement and Vitamin D, to reduce the rate of osteoporosis.   Avoid drinking alcohol in excess (more than two drinks per day).  Avoid use of street drugs. Do not share needles with anyone. Ask for professional help if you need assistance or instructions on stopping the use of alcohol, cigarettes, and/or drugs.  Brush your teeth twice a day with fluoride toothpaste, and floss once a day. Good oral hygiene prevents tooth decay and gum disease. The problems can be painful, unattractive, and can cause other health problems. Visit your dentist for a routine oral and dental check up and preventive care every 6-12 months.   Look at your skin regularly.  Use a mirror to  look at your back. Notify your caregivers of changes in moles, especially if there are changes in shapes, colors, a size larger than a pencil eraser, an irregular border, or development of new moles.  Safety:  Use seatbelts 100% of the time, whether driving or as a passenger.  Use safety devices such as hearing protection if you work in environments with loud noise or significant background noise.  Use safety glasses when doing any work that could send debris in to the eyes.  Use a helmet if you ride a bike or motorcycle.  Use appropriate safety gear for contact sports.  Talk to your caregiver about gun safety.  Use sunscreen with a SPF (or skin protection factor) of 15 or greater.  Lighter skinned people are at a greater risk of skin cancer. Don't forget to also wear sunglasses in order to protect your eyes from too much damaging sunlight. Damaging sunlight can accelerate cataract formation.   Practice safe sex. Use condoms. Condoms are used for birth control and to help reduce the spread of  sexually transmitted infections (or STIs).  Some of the STIs are gonorrhea (the clap), chlamydia, syphilis, trichomonas, herpes, HPV (human papilloma virus) and HIV (human immunodeficiency virus) which causes AIDS. The herpes, HIV and HPV are viral illnesses that have no cure. These can result in disability, cancer and death.   Keep carbon monoxide and smoke detectors in your home functioning at all times. Change the batteries every 6 months or use a model that plugs into the wall.   Vaccinations:  Stay up to date with your tetanus shots and other required immunizations. You should have a booster for tetanus every 10 years. Be sure to get your flu shot every year, since 5%-20% of the [place] population comes down with the flu. The flu vaccine changes each year, so being vaccinated once is not enough. Get your shot in the fall, before the flu season peaks.   Other vaccines to consider:  Human Papilloma Virus or HPV  causes cancer of the cervix, and other infections that can be transmitted from person to person. There is a vaccine for HPV, and females should get immunized between the ages of 66 and 42. It requires a series of 3 shots.   Pneumococcal vaccine to protect against certain types of pneumonia.  This is normally recommended for adults age 20 or older.  However, adults younger than 78 years old with certain underlying conditions such as diabetes, heart or lung disease should also receive the vaccine.  Shingles vaccine to protect against Varicella Zoster if you are older than age 61, or younger than 78 years old with certain underlying illness.  Hepatitis A vaccine to protect against a form of infection of the liver by a virus acquired from food.  Hepatitis B vaccine to protect against a form of infection of the liver by a virus acquired from blood or body fluids, particularly if you work in health care.  If you plan to travel internationally, check with your local health department for specific vaccination recommendations.  Cancer Screening:  Breast cancer screening is essential to preventive care for women. All women age 21 and older should perform a breast self-exam every month. At age 43 and older, women should have their caregiver complete a breast exam each year. Women at ages 53 and older should have a mammogram (x-ray film) of the breasts. Your caregiver can discuss how often you need mammograms.    Cervical cancer screening includes taking a Pap smear (sample of cells examined under a microscope) from the cervix (end of the uterus). It also includes testing for HPV (Human Papilloma Virus, which can cause cervical cancer). Screening and a pelvic exam should begin at age 35, or 3 years after a woman becomes sexually active. Screening should occur every year, with a Pap smear but no HPV testing, up to age 61. After age 22, you should have a Pap smear every 3 years with HPV testing, if no HPV was  found previously.   Most routine colon cancer screening begins at the age of 69. On a yearly basis, doctors may provide special easy to use take-home tests to check for hidden blood in the stool. Sigmoidoscopy or colonoscopy can detect the earliest forms of colon cancer and is life saving. These tests use a small camera at the end of a tube to directly examine the colon. Speak to your caregiver about this at age 74, when routine screening begins (and is repeated every 5 years unless early forms of pre-cancerous polyps or small growths  are found).

## 2013-09-27 NOTE — Progress Notes (Signed)
Subjective:   [name] is a 80 y.o. female who presents for Medicare Annual Wellness Visit and 3 month follow up on hypertension, prediabetes, hyperlipidemia, vitamin D def.  Date of last medicare wellness visit is unknown.   Her blood pressure has been controlled at home, today their BP is BP: 110/58 mmHg She does workout. She denies chest pain, shortness of breath, dizziness.  She is not on cholesterol medication and denies myalgias. Her cholesterol is at goal.   She has been working on diet and exercise for prediabetes, and denies nausea, paresthesia of the feet and polydipsia. Last A1C in the office was:  Lab Results  Component Value Date   HGBA1C 6.1* 06/29/2013   Patient is on Vitamin D supplement. She complains of diarrhea for several years, has seen Dr. Matthias Hughs in the past.  She has lower back pain.   Names of Other Physician/Practitioners you currently use: 1. [facility]- here for primary care 2. Dr. Hyacinth Meeker, eye doctor, last visit 09/2012 3. Dr. Matthias Hughs 4. Dr. Patty Sermons 5. Dr. Rudean Haskell Patient Care Team: Lucky Cowboy, MD as PCP - General (Internal Medicine)  Medication Review Current Outpatient Prescriptions on File Prior to Visit  Medication Sig Dispense Refill  . aspirin 81 MG tablet Take 81 mg by mouth at bedtime.       . Cholecalciferol (VITAMIN D3) 1000 UNITS CAPS Take 1,000 Units by mouth 2 (two) times daily.       . Flaxseed, Linseed, (FLAX SEED OIL PO) Take 1 tablet by mouth 3 (three) times daily.       . furosemide (LASIX) 20 MG tablet Take 0.5 tablets (10 mg total) by mouth as needed (take (10 mg ) if top number of bp is greater than 175).  15 tablet  0  . MAGNESIUM OXIDE PO Take 250 mg by mouth daily.      . Multiple Vitamins-Minerals (OCUVITE PRESERVISION PO) Take 1 tablet by mouth 2 (two) times daily.       . promethazine (PHENERGAN) 25 MG tablet Take 25 mg by mouth every 6 (six) hours as needed. For nausea      .  SYNTHROID 75 MCG tablet TAKE 1 TABLET EVERY DAY  90 tablet  0  . traMADol (ULTRAM) 50 MG tablet Take 50 mg by mouth every 6 (six) hours as needed. For pain      . triamcinolone (KENALOG) 0.025 % cream Apply topically as needed.       No current facility-administered medications on file prior to visit.    Current Problems (verified) Patient Active Problem List   Diagnosis Date Noted  . Hyperlipidemia 06/29/2013  . Prediabetes   . GERD (gastroesophageal reflux disease)   . Syncope 05/24/2012  . Edema 09/17/2011  . DOE (dyspnea on exertion) 06/06/2011  . Hypertension   . AV block, 1st degree   . CVD (cerebrovascular disease)   . Osteoarthritis   . Hypothyroidism   . Atrial fibrillation   . Pneumonia     Screening Tests Health Maintenance  Topic Date Due  . Colonoscopy  02/25/1967  . Influenza Vaccine  12/24/2013  . Tetanus/tdap  03/24/2023  . Pneumococcal Polysaccharide Vaccine Age 42 And Over  Completed  . Zostavax  Completed     Immunization History  Administered Date(s) Administered  . Influenza Split 02/03/2013  . Pneumococcal-Unspecified 05/26/2009  . Td 03/23/2013  . Zoster 05/26/2005    Preventative care: Last colonoscopy: 2009 will not have another Last  mammogram: declines Last pap smear/pelvic exam: declines DEXA:declines  Prior vaccinations: TD or Tdap: 2014  Influenza: 2014 Pneumococcal: 2011 Shingles/Zostavax: 2007  History reviewed: allergies, current medications, past family history, past medical history, past social history, past surgical history and problem list  Risk Factors: Osteoporosis: postmenopausal estrogen deficiency and dietary calcium and/or vitamin D deficiency History of fracture in the past year: no  Tobacco History  Substance Use Topics  . Smoking status: Never Smoker   . Smokeless tobacco: Not on file  . Alcohol Use: No   She does not smoke.  Patient is not a former smoker. Are there smokers in your home (other than  you)?  No  Alcohol Current alcohol use: none  Caffeine Current caffeine use: coffee 1 /day  Exercise Exercise limitations: The patient is experiencing exercise intolerance (back pain). Current exercise: housecleaning and no regular exercise  Nutrition/Diet Current diet: in general, a "healthy" diet    Cardiac risk factors: advanced age (older than 6855 for men, 5565 for women), dyslipidemia, hypertension and sedentary lifestyle.  Depression Screen (Note: if answer to either of the following is "Yes", a more complete depression screening is indicated)   Q1: Over the past two weeks, have you felt down, depressed or hopeless? No  Q2: Over the past two weeks, have you felt little interest or pleasure in doing things? No  Have you lost interest or pleasure in daily life? No  Do you often feel hopeless? No  Do you cry easily over simple problems? No  Activities of Daily Living In your present state of health, do you have any difficulty performing the following activities?:  Driving? Yes Managing money?  No Feeding yourself? No Getting from bed to chair? No Climbing a flight of stairs? Yes Preparing food and eating?: No Bathing or showering? No Getting dressed: No Getting to the toilet? No Using the toilet:No Moving around from place to place: Yes In the past year have you fallen or had a near fall?:No   Are you sexually active?  No  Do you have more than one partner?  No  Vision Difficulties: Yes  Hearing Difficulties: Yes Do you often ask people to speak up or repeat themselves? Yes Do you experience ringing or noises in your ears? No Do you have difficulty understanding soft or whispered voices? Yes  Cognition  Do you feel that you have a problem with memory?Yes  Do you often misplace items? No  Do you feel safe at home?  Yes  Advanced directives Does patient have a Health Care Power of Attorney? Yes Does patient have a Living Will? Yes   Objective:   Blood  pressure 110/58, pulse 80, temperature 98.3 F (36.8 C), resp. rate 16, weight 103 lb (46.72 kg). Body mass index is 18.83 kg/(m^2).  General appearance: alert, no distress, WD/WN,  female Cognitive Testing  Alert? Yes  Normal Appearance?Yes  Oriented to person? Yes  Place? Yes   Time? Yes  Recall of three objects?  No  Can perform simple calculations? Yes  Displays appropriate judgment?Yes  Can read the correct time from a watch face?Yes  HEENT: normocephalic, sclerae anicteric, TMs pearly, nares patent, no discharge or erythema, pharynx normal Oral cavity: MMM, no lesions Neck: supple, no lymphadenopathy, no thyromegaly, no masses Heart: RRR, normal S1, S2, no murmurs Lungs: CTA bilaterally, no wheezes, rhonchi, or rales Abdomen: +bs, soft, non tender, non distended, no masses, no hepatomegaly, no splenomegaly Musculoskeletal: nontender, no swelling, no obvious deformity Extremities: no edema,  no cyanosis, no clubbing Pulses: 2+ symmetric, upper and lower extremities, normal cap refill Neurological: alert, oriented x 3, CN2-12 intact, strength normal upper extremities and lower extremities, sensation normal throughout, DTRs 2+ throughout, no cerebellar signs, gait normal Psychiatric: normal affect, behavior normal, pleasant  Breast: defer Gyn: defer Rectal: defer   Assessment:   1. Hypertension - CBC with Differential - BASIC METABOLIC PANEL WITH GFR - Hepatic function panel - TSH  2. GERD (gastroesophageal reflux disease)   3. Hypothyroidism   4. Prediabetes Discussed general issues about diabetes pathophysiology and management., Educational material distributed., Suggested low cholesterol diet., Encouraged aerobic exercise., Discussed foot care., Reminded to get yearly retinal exam. - Hemoglobin A1c - HM DIABETES FOOT EXAM - Ambulatory referral to Podiatry  5. Hyperlipidemia - Lipid panel  6. Edema  7. Encounter for long-term (current) use of other  medications - Magnesium  8. Nail fungal infection - Ambulatory referral to Podiatry   Plan:   During the course of the visit the patient was educated and counseled about appropriate screening and preventive services including:    Pneumococcal vaccine   Influenza vaccine  Td vaccine  Screening electrocardiogram  Screening mammography  Bone densitometry screening  Diabetes screening  Glaucoma screening  Nutrition counseling   Screening recommendations, referrals:  Vaccinations: Tdap vaccine not indicated Influenza vaccine not indicated Pneumococcal vaccine not indicated Shingles vaccine not indicated Hep B vaccine not indicated  Nutrition assessed and recommended  Colonoscopy not indicated Mammogram declined Pap smear not indicated Pelvic exam not indicated Recommended yearly ophthalmology/optometry visit for glaucoma screening and checkup Recommended yearly dental visit for hygiene and checkup Advanced directives - requested  Conditions/risks identified: BMI: Discussed weight loss, diet, and increase physical activity.  Increase physical activity: AHA recommends 150 minutes of physical activity a week.  Medications reviewed DEXA- declined Diabetes is at goal, ACE/ARB therapy: Yes. Urinary Incontinence is an issue: discussed non pharmacology and pharmacology options.  Fall risk: moderate- discussed PT, home fall assessment, medications.   Medicare Attestation I have personally reviewed: The patient's medical and social history Their use of alcohol, tobacco or illicit drugs Their current medications and supplements The patient's functional ability including ADLs,fall risks, home safety risks, cognitive, and hearing and visual impairment Diet and physical activities Evidence for depression or mood disorders  The patient's weight, height, BMI, and visual acuity have been recorded in the chart.  I have made referrals, counseling, and provided education to  the patient based on review of the above and I have provided the patient with a written personalized care plan for preventive services.     Quentin Mullingmanda Sherhonda Gaspar, PA-C   09/27/2013

## 2013-10-18 ENCOUNTER — Encounter: Payer: Self-pay | Admitting: Podiatry

## 2013-10-18 ENCOUNTER — Ambulatory Visit (INDEPENDENT_AMBULATORY_CARE_PROVIDER_SITE_OTHER): Payer: Medicare Other | Admitting: Podiatry

## 2013-10-18 VITALS — BP 147/75 | HR 84 | Resp 16 | Ht 62.0 in | Wt 104.0 lb

## 2013-10-18 DIAGNOSIS — B351 Tinea unguium: Secondary | ICD-10-CM

## 2013-10-18 DIAGNOSIS — M79609 Pain in unspecified limb: Secondary | ICD-10-CM

## 2013-10-18 NOTE — Progress Notes (Signed)
   Subjective:    Patient ID: [name], female    DOB: 1914/10/17, 80 y.o.   MRN: [id number]  HPI Comments: "I have these bad toenails"  Patient c/o thick, discolored toenails, especially the 1st bilateral, for several months. They are tender with shoes. She usually has her son trim the toenails for her.      Review of Systems  HENT: Positive for hearing loss.   Eyes: Positive for visual disturbance.  Cardiovascular: Positive for leg swelling.  Gastrointestinal: Positive for diarrhea.  Endocrine: Positive for polyuria.  Genitourinary: Positive for frequency.  Musculoskeletal: Positive for back pain, gait problem and myalgias.  Allergic/Immunologic: Positive for environmental allergies.  Neurological: Positive for dizziness, numbness and headaches.  Hematological: Bruises/bleeds easily.  All other systems reviewed and are negative.      Objective:   Physical Exam: I reviewed her past medical history medications allergies surgeries and social history. Pulses are palpable bilateral. Neurologic sensorium is intact per since once the monofilament. Muscle strength is normal bilateral. Deep tendon reflexes are intact bilateral. She has mild hammertoe deformities noted bilateral but her a symptomatic. She has thick yellow dystrophic clinically mycotic nails hallux bilateral. And to a lesser degree the lesser nails bilateral.        Assessment & Plan:  Assessment: Pain in limb secondary to onychomycosis.  Plan: Debridement of nails 1 through 5 bilateral covered service secondary to pain followup with her in 2-3 months.

## 2013-11-05 ENCOUNTER — Encounter (HOSPITAL_COMMUNITY): Payer: Self-pay | Admitting: Emergency Medicine

## 2013-11-05 ENCOUNTER — Emergency Department (HOSPITAL_COMMUNITY)
Admission: EM | Admit: 2013-11-05 | Discharge: 2013-11-05 | Disposition: A | Discharge status: Home / Self Care | Payer: Medicare Other | Attending: Emergency Medicine | Admitting: Emergency Medicine

## 2013-11-05 DIAGNOSIS — Z79899 Other long term (current) drug therapy: Secondary | ICD-10-CM | POA: Insufficient documentation

## 2013-11-05 DIAGNOSIS — Z8719 Personal history of other diseases of the digestive system: Secondary | ICD-10-CM | POA: Insufficient documentation

## 2013-11-05 DIAGNOSIS — Z7982 Long term (current) use of aspirin: Secondary | ICD-10-CM | POA: Insufficient documentation

## 2013-11-05 DIAGNOSIS — Z87828 Personal history of other (healed) physical injury and trauma: Secondary | ICD-10-CM | POA: Insufficient documentation

## 2013-11-05 DIAGNOSIS — Z8701 Personal history of pneumonia (recurrent): Secondary | ICD-10-CM | POA: Insufficient documentation

## 2013-11-05 DIAGNOSIS — M1990 Unspecified osteoarthritis, unspecified site: Secondary | ICD-10-CM | POA: Insufficient documentation

## 2013-11-05 DIAGNOSIS — I10 Essential (primary) hypertension: Secondary | ICD-10-CM | POA: Insufficient documentation

## 2013-11-05 DIAGNOSIS — Z8673 Personal history of transient ischemic attack (TIA), and cerebral infarction without residual deficits: Secondary | ICD-10-CM | POA: Insufficient documentation

## 2013-11-05 DIAGNOSIS — E039 Hypothyroidism, unspecified: Secondary | ICD-10-CM | POA: Insufficient documentation

## 2013-11-05 LAB — CBC WITH DIFFERENTIAL/PLATELET
Basophils Absolute: 0.1 10*3/uL (ref 0.0–0.1)
Basophils Relative: 1 % (ref 0–1)
EOS PCT: 7 % — AB (ref 0–5)
Eosinophils Absolute: 0.6 10*3/uL (ref 0.0–0.7)
HEMATOCRIT: 36.4 % (ref 36.0–46.0)
Hemoglobin: 12.4 g/dL (ref 12.0–15.0)
LYMPHS ABS: 2.7 10*3/uL (ref 0.7–4.0)
LYMPHS PCT: 34 % (ref 12–46)
MCH: 31.5 pg (ref 26.0–34.0)
MCHC: 34.1 g/dL (ref 30.0–36.0)
MCV: 92.4 fL (ref 78.0–100.0)
MONO ABS: 1.1 10*3/uL — AB (ref 0.1–1.0)
MONOS PCT: 14 % — AB (ref 3–12)
Neutro Abs: 3.6 10*3/uL (ref 1.7–7.7)
Neutrophils Relative %: 44 % (ref 43–77)
Platelets: 269 10*3/uL (ref 150–400)
RBC: 3.94 MIL/uL (ref 3.87–5.11)
RDW: 12.4 % (ref 11.5–15.5)
WBC: 8.1 10*3/uL (ref 4.0–10.5)

## 2013-11-05 LAB — COMPREHENSIVE METABOLIC PANEL
ALT: 22 U/L (ref 0–35)
AST: 33 U/L (ref 0–37)
Albumin: 4.1 g/dL (ref 3.5–5.2)
Alkaline Phosphatase: 73 U/L (ref 39–117)
BUN: 27 mg/dL — AB (ref 6–23)
CALCIUM: 9.5 mg/dL (ref 8.4–10.5)
CO2: 25 meq/L (ref 19–32)
CREATININE: 1.11 mg/dL — AB (ref 0.50–1.10)
Chloride: 95 mEq/L — ABNORMAL LOW (ref 96–112)
GFR, EST AFRICAN AMERICAN: 47 mL/min — AB (ref >90)
GFR, EST NON AFRICAN AMERICAN: 41 mL/min — AB (ref >90)
GLUCOSE: 105 mg/dL — AB (ref 70–99)
Potassium: 4.1 mEq/L (ref 3.7–5.3)
Sodium: 136 mEq/L — ABNORMAL LOW (ref 137–147)
Total Bilirubin: 0.3 mg/dL (ref 0.3–1.2)
Total Protein: 7.3 g/dL (ref 6.0–8.3)

## 2013-11-05 MED ORDER — LISINOPRIL 10 MG PO TABS
10.0000 mg | ORAL_TABLET | Freq: Once | ORAL | Status: AC
  Administered 2013-11-05: 10 mg via ORAL
  Filled 2013-11-05: qty 1

## 2013-11-05 MED ORDER — LISINOPRIL 5 MG PO TABS: 5.0000 mg | ORAL_TABLET | Freq: Every day | ORAL | Status: DC

## 2013-11-05 NOTE — Discharge Instructions (Signed)
Take lisinopril 5 mg daily after supper.   Check blood pressure frequently as you have been.   Follow up with your doctor to recheck blood pressure in a week.   Return to ER if you are passing out, chest pain, shortness of breath, headache, weakness.

## 2013-11-05 NOTE — ED Provider Notes (Signed)
CSN: [id number]     Arrival date & time 11/05/13  2117 History   First MD Initiated Contact with Patient 11/05/13 2124     Chief Complaint  Patient presents with  . Hypertension     (Consider location/radiation/quality/duration/timing/severity/associated sxs/prior Treatment) The history is provided by the patient.  [name] is a 80 y.o. female hx of HTN, CVA, afib not on coumadin here with hypertension. Family has been monitoring her blood pressure. At baseline, she runs around 150-170 systolic. This evening, her bp went up to 214/101 at home. Denies headache or chest pain or shortness of breath or abdominal pain. She is not on any BP since it drops her diastolic BP too low and causes her to feel light headed. She takes lasix 10 mg prn elevated BP and took one dose tonight. Has been on lisinopril in the past.     Past Medical History  Diagnosis Date  . Syncope and collapse     Negative loop recorder in the past  . Hypertension   . AV block, 1st degree   . CVD (cerebrovascular disease)     History of CVA  . Hypokalemia   . Diarrhea   . Atrial fibrillation     not a candidate for coumadin due to falls  . Pneumonia 09/11  . Traumatic enucleation of eye     left eye  . History of echocardiogram 9/11    normal EF, mild to mod MR & TR, mod pulmonary HTN  . Varicose veins   . Advanced age   . Prediabetes   . Hypothyroidism   . GERD (gastroesophageal reflux disease)   . Osteoarthritis    Past Surgical History  Procedure Laterality Date  . Back surgery    . Inguinal hernia repair      left sided   Family History  Problem Relation Age of Onset  . Heart attack Father   . Heart attack Brother   . Cancer Brother   . Stroke Brother    History  Substance Use Topics  . Smoking status: Never Smoker   . Smokeless tobacco: Not on file  . Alcohol Use: No   OB History   Grav Para Term Preterm Abortions TAB SAB Ect Mult Living                 Review of Systems  All  other systems reviewed and are negative.     Allergies  Ciprocin-fluocin-procin; Codeine; and Sulfa drugs cross reactors  Home Medications   Prior to Admission medications   Medication Sig Start Date End Date Taking? Authorizing Provider  aspirin 81 MG tablet Take 81 mg by mouth at bedtime.    Yes Historical Provider, MD  Cholecalciferol (VITAMIN D3) 1000 UNITS CAPS Take 1,000 Units by mouth 2 (two) times daily.    Yes Historical Provider, MD  Flaxseed, Linseed, (FLAX SEED OIL PO) Take 1 tablet by mouth 3 (three) times daily.    Yes Historical Provider, MD  furosemide (LASIX) 20 MG tablet Take 0.5 tablets (10 mg total) by mouth as needed (take (10 mg ) if top number of bp is greater than 175). 05/30/13  Yes Rosalio MacadamiaLori C Gerhardt, NP  levothyroxine (SYNTHROID, LEVOTHROID) 75 MCG tablet Take 75 mcg by mouth daily before breakfast.   Yes Historical Provider, MD  MAGNESIUM OXIDE PO Take 250 mg by mouth daily.   Yes Historical Provider, MD  Multiple Vitamins-Minerals (OCUVITE PRESERVISION PO) Take 1 tablet by mouth 2 (two) times  daily.    Yes Historical Provider, MD  promethazine (PHENERGAN) 25 MG tablet Take 25 mg by mouth every 6 (six) hours as needed. For nausea   Yes Historical Provider, MD  traMADol (ULTRAM) 50 MG tablet Take 50 mg by mouth every 6 (six) hours as needed. For pain   Yes Historical Provider, MD  triamcinolone (KENALOG) 0.025 % cream Apply 1 application topically as needed (for rash).    Yes Historical Provider, MD   BP 152/99  Pulse 72  Temp(Src) 97.5 F (36.4 C) (Oral)  Resp 18  SpO2 98% Physical Exam  Nursing note and vitals reviewed. Constitutional: She is oriented to person, place, and time.  Chronically ill, NAD   HENT:  Head: Normocephalic.  Mouth/Throat: Oropharynx is clear and moist.  Eyes: EOM are normal. Pupils are equal, round, and reactive to light.  Neck: Normal range of motion. Neck supple.  Cardiovascular: Normal rate, regular rhythm and normal heart  sounds.   Pulmonary/Chest: Effort normal and breath sounds normal. No respiratory distress. She has no wheezes. She has no rales.  Abdominal: Soft. Bowel sounds are normal. She exhibits no distension. There is no tenderness. There is no rebound and no guarding.  Musculoskeletal: Normal range of motion. She exhibits no edema and no tenderness.  Neurological: She is alert and oriented to person, place, and time.  Nl sensation, nl strength throughout. CN 2-12 intact.   Skin: Skin is warm and dry.  Psychiatric: She has a normal mood and affect. Her behavior is normal. Judgment and thought content normal.    ED Course  Procedures (including critical care time) Labs Review Labs Reviewed  CBC WITH DIFFERENTIAL - Abnormal; Notable for the following:    Monocytes Relative 14 (*)    Monocytes Absolute 1.1 (*)    Eosinophils Relative 7 (*)    All other components within normal limits  COMPREHENSIVE METABOLIC PANEL - Abnormal; Notable for the following:    Sodium 136 (*)    Chloride 95 (*)    Glucose, Bld 105 (*)    BUN 27 (*)    Creatinine, Ser 1.11 (*)    GFR calc non Af Amer 41 (*)    GFR calc Af Amer 47 (*)    All other components within normal limits    Imaging Review No results found.   EKG Interpretation   Date/Time:  Saturday November 05 2013 21:35:05 EDT Ventricular Rate:  89 PR Interval:  214 QRS Duration: 77 QT Interval:  363 QTC Calculation: 442 R Axis:   40 Text Interpretation:  Sinus or ectopic atrial rhythm Borderline prolonged  PR interval No significant change since last tracing Confirmed by Kennethia Lynes  MD,  Leva Baine ([id number]) on 11/05/2013 9:52:08 PM      MDM   Final diagnoses:  None   [name] is a [age] y.o. female here with asymptomatic hypertension. She was on lisinopril before. Will check renal function and give low dose lisinopril.   10:51 PM Patient's labs at baseline. Given lisinopril and BP dec to 165/64. Will start low dose lisinopril 5 mg daily. Stable for  d/c.    Richardean Canalavid H Avrey Flanagin, MD 11/05/13 2252

## 2013-11-05 NOTE — ED Notes (Addendum)
Pt reports to the ED for eval of hypertension of 214/101 on home BP cuff. Per family pt has been having increasingly high BP since 6/1 ranging from 150s-180s systolic and 60-100s diastolic. Tonight her BP was 214/101 and she took 10 mg of Lasix that she is supposed to take when her BP is >170. BP at this time 202/76. Pt denies any HA or CP. Pt NSR on monitor. No neuro deficits noted. Pt A&Ox4, resp e/u, and skin warm and dry.

## 2013-11-07 ENCOUNTER — Telehealth: Payer: Self-pay | Admitting: Nurse Practitioner

## 2013-11-07 NOTE — Telephone Encounter (Signed)
New Message  Pt seen in ER over the weekend// For elevated blood pressures// Requests a call back to discuss medication dosage changes//SR

## 2013-11-07 NOTE — Telephone Encounter (Signed)
Husband calling stating she was seen over the weekend in ER due to elevated BP.  Sunday SpillersLori Gerhardt,NP had told them if BP was over 175 for her to take Furosemide.  He states that Sat was 214/101. He states that when they give her Furosemide seems to hold her only for 1 day.  ER physician gave her Linisipril 5 mg.  He states BP last PM was 117/54. He states when BP is low she gets unsteady and lightheaded and afraid she will fall. Advised to take BP now.  BP was 137/55. Advised to take Lisinopril and to hold taking Furosemide unless BP is extremely high.  Scheduled her to come in Fri for nurse visit for BP check. He states they take BP at night because that seems to be when it is elevated.  Suggested they also take BP in AM. Will come in Friday for BP check and continue to monitor BP this week and bring readings with them.

## 2013-11-09 ENCOUNTER — Encounter (HOSPITAL_COMMUNITY): Payer: Self-pay | Admitting: Emergency Medicine

## 2013-11-09 ENCOUNTER — Emergency Department (HOSPITAL_COMMUNITY)
Admission: EM | Admit: 2013-11-09 | Discharge: 2013-11-09 | Disposition: A | Discharge status: Home / Self Care | Payer: Medicare Other | Attending: Emergency Medicine | Admitting: Emergency Medicine

## 2013-11-09 ENCOUNTER — Emergency Department (HOSPITAL_COMMUNITY): Payer: Medicare Other

## 2013-11-09 ENCOUNTER — Telehealth: Payer: Self-pay | Admitting: Nurse Practitioner

## 2013-11-09 DIAGNOSIS — R5383 Other fatigue: Secondary | ICD-10-CM

## 2013-11-09 DIAGNOSIS — Z8701 Personal history of pneumonia (recurrent): Secondary | ICD-10-CM | POA: Insufficient documentation

## 2013-11-09 DIAGNOSIS — R269 Unspecified abnormalities of gait and mobility: Secondary | ICD-10-CM | POA: Insufficient documentation

## 2013-11-09 DIAGNOSIS — I10 Essential (primary) hypertension: Secondary | ICD-10-CM | POA: Insufficient documentation

## 2013-11-09 DIAGNOSIS — Z79899 Other long term (current) drug therapy: Secondary | ICD-10-CM | POA: Insufficient documentation

## 2013-11-09 DIAGNOSIS — R5381 Other malaise: Secondary | ICD-10-CM | POA: Insufficient documentation

## 2013-11-09 DIAGNOSIS — I4891 Unspecified atrial fibrillation: Secondary | ICD-10-CM | POA: Insufficient documentation

## 2013-11-09 DIAGNOSIS — Z8639 Personal history of other endocrine, nutritional and metabolic disease: Secondary | ICD-10-CM | POA: Insufficient documentation

## 2013-11-09 DIAGNOSIS — K219 Gastro-esophageal reflux disease without esophagitis: Secondary | ICD-10-CM | POA: Insufficient documentation

## 2013-11-09 DIAGNOSIS — Z87828 Personal history of other (healed) physical injury and trauma: Secondary | ICD-10-CM | POA: Insufficient documentation

## 2013-11-09 DIAGNOSIS — Z8673 Personal history of transient ischemic attack (TIA), and cerebral infarction without residual deficits: Secondary | ICD-10-CM | POA: Insufficient documentation

## 2013-11-09 DIAGNOSIS — Z862 Personal history of diseases of the blood and blood-forming organs and certain disorders involving the immune mechanism: Secondary | ICD-10-CM | POA: Insufficient documentation

## 2013-11-09 DIAGNOSIS — M1990 Unspecified osteoarthritis, unspecified site: Secondary | ICD-10-CM | POA: Insufficient documentation

## 2013-11-09 DIAGNOSIS — Z7982 Long term (current) use of aspirin: Secondary | ICD-10-CM | POA: Insufficient documentation

## 2013-11-09 LAB — COMPREHENSIVE METABOLIC PANEL
ALT: 21 U/L (ref 0–35)
AST: 36 U/L (ref 0–37)
Albumin: 4.2 g/dL (ref 3.5–5.2)
Alkaline Phosphatase: 63 U/L (ref 39–117)
BILIRUBIN TOTAL: 0.4 mg/dL (ref 0.3–1.2)
BUN: 22 mg/dL (ref 6–23)
CHLORIDE: 97 meq/L (ref 96–112)
CO2: 27 mEq/L (ref 19–32)
Calcium: 9.9 mg/dL (ref 8.4–10.5)
Creatinine, Ser: 1.02 mg/dL (ref 0.50–1.10)
GFR calc Af Amer: 52 mL/min — ABNORMAL LOW (ref >90)
GFR calc non Af Amer: 45 mL/min — ABNORMAL LOW (ref >90)
Glucose, Bld: 92 mg/dL (ref 70–99)
Potassium: 5.3 mEq/L (ref 3.7–5.3)
Sodium: 137 mEq/L (ref 137–147)
Total Protein: 7.5 g/dL (ref 6.0–8.3)

## 2013-11-09 LAB — CBC
HEMATOCRIT: 37.6 % (ref 36.0–46.0)
HEMOGLOBIN: 12.4 g/dL (ref 12.0–15.0)
MCH: 31 pg (ref 26.0–34.0)
MCHC: 33 g/dL (ref 30.0–36.0)
MCV: 94 fL (ref 78.0–100.0)
Platelets: 300 10*3/uL (ref 150–400)
RBC: 4 MIL/uL (ref 3.87–5.11)
RDW: 12.5 % (ref 11.5–15.5)
WBC: 8 10*3/uL (ref 4.0–10.5)

## 2013-11-09 LAB — PROTIME-INR
INR: 0.99 (ref 0.00–1.49)
Prothrombin Time: 12.9 seconds (ref 11.6–15.2)

## 2013-11-09 LAB — DIFFERENTIAL
Basophils Absolute: 0.1 10*3/uL (ref 0.0–0.1)
Basophils Relative: 1 % (ref 0–1)
EOS ABS: 0.7 10*3/uL (ref 0.0–0.7)
Eosinophils Relative: 9 % — ABNORMAL HIGH (ref 0–5)
Lymphocytes Relative: 34 % (ref 12–46)
Lymphs Abs: 2.8 10*3/uL (ref 0.7–4.0)
MONOS PCT: 16 % — AB (ref 3–12)
Monocytes Absolute: 1.3 10*3/uL — ABNORMAL HIGH (ref 0.1–1.0)
NEUTROS ABS: 3.1 10*3/uL (ref 1.7–7.7)
Neutrophils Relative %: 40 % — ABNORMAL LOW (ref 43–77)

## 2013-11-09 LAB — I-STAT CHEM 8, ED
BUN: 22 mg/dL (ref 6–23)
CALCIUM ION: 1.08 mmol/L — AB (ref 1.13–1.30)
CHLORIDE: 106 meq/L (ref 96–112)
Creatinine, Ser: 1.1 mg/dL (ref 0.50–1.10)
Glucose, Bld: 84 mg/dL (ref 70–99)
HEMATOCRIT: 39 % (ref 36.0–46.0)
Hemoglobin: 13.3 g/dL (ref 12.0–15.0)
Potassium: 4.4 mEq/L (ref 3.7–5.3)
Sodium: 134 mEq/L — ABNORMAL LOW (ref 137–147)
TCO2: 27 mmol/L (ref 0–100)

## 2013-11-09 LAB — APTT: aPTT: 37 seconds (ref 24–37)

## 2013-11-09 LAB — I-STAT TROPONIN, ED: Troponin i, poc: 0.01 ng/mL (ref 0.00–0.08)

## 2013-11-09 NOTE — Discharge Instructions (Signed)
Hypertension Hypertension, commonly called high blood pressure, is when the force of blood pumping through your arteries is too strong. Your arteries are the blood vessels that carry blood from your heart throughout your body. A blood pressure reading consists of a higher number over a lower number, such as 110/72. The higher number (systolic) is the pressure inside your arteries when your heart pumps. The lower number (diastolic) is the pressure inside your arteries when your heart relaxes. Ideally you want your blood pressure below 120/80. Hypertension forces your heart to work harder to pump blood. Your arteries may become narrow or stiff. Having hypertension puts you at risk for heart disease, stroke, and other problems.  RISK FACTORS Some risk factors for high blood pressure are controllable. Others are not.  Risk factors you cannot control include:   Race. You may be at higher risk if you are African American.  Age. Risk increases with age.  Gender. Men are at higher risk than women before age 45 years. After age 65, women are at higher risk than men. Risk factors you can control include:  Not getting enough exercise or physical activity.  Being overweight.  Getting too much fat, sugar, calories, or salt in your diet.  Drinking too much alcohol. SIGNS AND SYMPTOMS Hypertension does not usually cause signs or symptoms. Extremely high blood pressure (hypertensive crisis) may cause headache, anxiety, shortness of breath, and nosebleed. DIAGNOSIS  To check if you have hypertension, your health care provider will measure your blood pressure while you are seated, with your arm held at the level of your heart. It should be measured at least twice using the same arm. Certain conditions can cause a difference in blood pressure between your right and left arms. A blood pressure reading that is higher than normal on one occasion does not mean that you need treatment. If one blood pressure reading  is high, ask your health care provider about having it checked again. TREATMENT  Treating high blood pressure includes making lifestyle changes and possibly taking medication. Living a healthy lifestyle can help lower high blood pressure. You may need to change some of your habits. Lifestyle changes may include:  Following the DASH diet. This diet is high in fruits, vegetables, and whole grains. It is low in salt, red meat, and added sugars.  Getting at least 2 1/2 hours of brisk physical activity every week.  Losing weight if necessary.  Not smoking.  Limiting alcoholic beverages.  Learning ways to reduce stress. If lifestyle changes are not enough to get your blood pressure under control, your health care provider may prescribe medicine. You may need to take more than one. Work closely with your health care provider to understand the risks and benefits. HOME CARE INSTRUCTIONS  Have your blood pressure rechecked as directed by your health care provider.   Only take medicine as directed by your health care provider. Follow the directions carefully. Blood pressure medicines must be taken as prescribed. The medicine does not work as well when you skip doses. Skipping doses also puts you at risk for problems.   Do not smoke.   Monitor your blood pressure at home as directed by your health care provider. SEEK MEDICAL CARE IF:   You think you are having a reaction to medicines taken.  You have recurrent headaches or feel dizzy.  You have swelling in your ankles.  You have trouble with your vision. SEEK IMMEDIATE MEDICAL CARE IF:  You develop a severe headache or   confusion.  You have unusual weakness, numbness, or feel faint.  You have severe chest or abdominal pain.  You vomit repeatedly.  You have trouble breathing. MAKE SURE YOU:   Understand these instructions.  Will watch your condition.  Will get help right away if you are not doing well or get  worse. Document Released: 05/12/2005 Document Revised: 05/17/2013 Document Reviewed: 03/04/2013 [employer] Patient Information 2015 [employer]. This information is not intended to replace advice given to you by your health care provider. Make sure you discuss any questions you have with your health care provider.  

## 2013-11-09 NOTE — Telephone Encounter (Signed)
New message     Talk to [name]---problems with bp and no strength in left hand

## 2013-11-09 NOTE — Telephone Encounter (Signed)
Called patient's son - apologized that his message was not addressed earlier today in my absence from the clinic.   [name] had trouble early Saturday morning with her left hand. It resolved. Her BP was quite high Saturday - she was taken to the ER - started on Lisinopril - she was having NO problems with her left hand during her ER visit.  Today - she has been dropping items with her left hand. Left hand is numb. BP readings reviewed - would cut the Lisinopril in half. Advised to go to the ER for a CT scan of the head to rule out stroke.   Son will call back to give us an update.

## 2013-11-09 NOTE — ED Notes (Addendum)
Family reported pt.'s elevated blood pressure this week ( 214/101 last Saturday ) with generalized weakness " dropping things " / unsteady gait . Speech clear , no facial asymmetry , equal grips with no arm drift. Family stated they were advised by pt.'s MD to go to ER to evaluate for stroke and requested CT scan .

## 2013-11-09 NOTE — ED Provider Notes (Signed)
CSN: [id number]     Arrival date & time 11/09/13  1958 History   First MD Initiated Contact with Patient 11/09/13 2155     Chief Complaint  Patient presents with  . Hypertension  . Weakness  . Gait Problem     (Consider location/radiation/quality/duration/timing/severity/associated sxs/prior Treatment) Patient is a 80 y.o. female presenting with hypertension and weakness. The history is provided by a relative and the patient.  Hypertension  Weakness   She is here for an episode of weakness of left hand. This occurred earlier today, when she was walking, in her home. She also had an episode of this one week ago. After that, she was evaluated and started on lisinopril for retention. There's been no recent fever, chills, nausea, vomiting, chest pain, shortness of breath, anorexia, dysuria, or back pain. She's taking her medication as prescribed. There are no other known modifying factors.  Past Medical History  Diagnosis Date  . Syncope and collapse     Negative loop recorder in the past  . Hypertension   . AV block, 1st degree   . CVD (cerebrovascular disease)     History of CVA  . Hypokalemia   . Diarrhea   . Atrial fibrillation     not a candidate for coumadin due to falls  . Pneumonia 09/11  . Traumatic enucleation of eye     left eye  . History of echocardiogram 9/11    normal EF, mild to mod MR & TR, mod pulmonary HTN  . Varicose veins   . Advanced age   . Prediabetes   . Hypothyroidism   . GERD (gastroesophageal reflux disease)   . Osteoarthritis    Past Surgical History  Procedure Laterality Date  . Back surgery    . Inguinal hernia repair      left sided   Family History  Problem Relation Age of Onset  . Heart attack Father   . Heart attack Brother   . Cancer Brother   . Stroke Brother    History  Substance Use Topics  . Smoking status: Never Smoker   . Smokeless tobacco: Not on file  . Alcohol Use: No   OB History   Grav Para Term Preterm  Abortions TAB SAB Ect Mult Living                 Review of Systems  Neurological: Positive for weakness.  All other systems reviewed and are negative.     Allergies  Ciprocin-fluocin-procin; Ciprofloxacin hcl; Codeine; and Sulfa drugs cross reactors  Home Medications   Prior to Admission medications   Medication Sig Start Date End Date Taking? Authorizing Provider  aspirin 81 MG tablet Take 81 mg by mouth at bedtime.    Yes Historical Provider, MD  Cholecalciferol (VITAMIN D3) 1000 UNITS CAPS Take 1,000 Units by mouth 2 (two) times daily.    Yes Historical Provider, MD  Flaxseed, Linseed, (FLAX SEED OIL PO) Take 1 tablet by mouth 3 (three) times daily.    Yes Historical Provider, MD  furosemide (LASIX) 20 MG tablet Take 0.5 tablets (10 mg total) by mouth as needed (take (10 mg ) if top number of bp is greater than 175). 05/30/13  Yes Rosalio Macadamia, NP  levothyroxine (SYNTHROID, LEVOTHROID) 75 MCG tablet Take 75 mcg by mouth daily before breakfast.   Yes Historical Provider, MD  lisinopril (PRINIVIL,ZESTRIL) 5 MG tablet Take 1 tablet (5 mg total) by mouth daily. 11/05/13  Yes Richardean Canal, MD  MAGNESIUM OXIDE PO Take 250 mg by mouth daily.   Yes Historical Provider, MD  Multiple Vitamins-Minerals (OCUVITE PRESERVISION PO) Take 1 tablet by mouth 2 (two) times daily.    Yes Historical Provider, MD  promethazine (PHENERGAN) 25 MG tablet Take 25 mg by mouth every 6 (six) hours as needed. For nausea   Yes Historical Provider, MD  traMADol (ULTRAM) 50 MG tablet Take 50 mg by mouth every 6 (six) hours as needed. For pain   Yes Historical Provider, MD  triamcinolone (KENALOG) 0.025 % cream Apply 1 application topically as needed (for rash).    Yes Historical Provider, MD   BP 139/49  Pulse 58  Temp(Src) 97.4 F (36.3 C) (Oral)  Resp 18  Ht 5\' 2"  (1.575 m)  Wt 102 lb (46.267 kg)  BMI 18.65 kg/m2  SpO2 98% Physical Exam  Nursing note and vitals reviewed. Constitutional: She is oriented  to person, place, and time. She appears well-developed.  Elderly, frail  HENT:  Head: Normocephalic and atraumatic.  Eyes: Conjunctivae and EOM are normal. Pupils are equal, round, and reactive to light.  Neck: Normal range of motion and phonation normal. Neck supple.  Cardiovascular: Normal rate, regular rhythm and intact distal pulses.   Pulmonary/Chest: Effort normal and breath sounds normal. She exhibits no tenderness.  Abdominal: Soft. She exhibits no distension. There is no tenderness. There is no guarding.  Musculoskeletal: Normal range of motion.  Neurological: She is alert and oriented to person, place, and time. She exhibits normal muscle tone.  No dysarthria, and aphasia on this day is. Normal heel-to-shin and finger-to-nose, bilaterally.  Skin: Skin is warm and dry.  Psychiatric: She has a normal mood and affect. Her behavior is normal. Judgment and thought content normal.    ED Course  Procedures (including critical care time)  Medications - No data to display  Patient Vitals for the past 24 hrs:  BP Temp Temp src Pulse Resp SpO2 Height Weight  11/09/13 2100 147/57 mmHg - - 64 14 98 % - -  11/09/13 2056 139/49 mmHg 97.4 F (36.3 C) Oral 58 18 98 % - -  11/09/13 2046 - - - - - 100 % - -  11/09/13 2012 183/74 mmHg 97.8 F (36.6 C) Oral 64 14 100 % 5\' 2"  (1.575 m) 102 lb (46.267 kg)    10:24 PM Reevaluation with update and discussion. After initial assessment and treatment, an updated evaluation reveals no additional complaints. Findings discussed with patient and family members, all questions answered. WENTZ,ELLIOTT L    Labs Review Labs Reviewed  DIFFERENTIAL - Abnormal; Notable for the following:    Neutrophils Relative % 40 (*)    Monocytes Relative 16 (*)    Monocytes Absolute 1.3 (*)    Eosinophils Relative 9 (*)    All other components within normal limits  COMPREHENSIVE METABOLIC PANEL - Abnormal; Notable for the following:    GFR calc non Af Amer 45 (*)     GFR calc Af Amer 52 (*)    All other components within normal limits  I-STAT CHEM 8, ED - Abnormal; Notable for the following:    Sodium 134 (*)    Calcium, Ion 1.08 (*)    All other components within normal limits  URINE CULTURE  PROTIME-INR  APTT  CBC  I-STAT TROPOININ, ED    Imaging Review Ct Head (brain) Wo Contrast  11/09/2013   CLINICAL DATA:  HYPERTENSION WEAKNESS GAIT PROBLEM  EXAM: CT HEAD WITHOUT CONTRAST  TECHNIQUE: Contiguous axial images were obtained from the base of the skull through the vertex without intravenous contrast.  COMPARISON:  Head CT 02/06/2011  FINDINGS: No acute intracranial abnormality. Specifically, no hemorrhage, hydrocephalus, mass lesion, acute infarction, or significant intracranial injury. No acute calvarial abnormality. Age-appropriate global atrophy. Areas of low attenuation within the subcortical, deep, and periventricular white matter regions consistent with small vessel white matter ischaemia. Basal ganglial calcifications. Prosthetic globe on the left. Visualized paranasal sinuses and mastoid air cells are patent.  IMPRESSION: No acute intracranial abnormality. Chronic and involutional changes.   Electronically Signed   By: Salome HolmesHector  Cooper M.D.   On: 11/09/2013 21:42     EKG Interpretation None      MDM   Final diagnoses:  None    Hypertension, with episode of weakness. Doubt CVA, TIA, metabolic instability, or serious bacterial infection.   Nursing Notes Reviewed/ Care Coordinated Applicable Imaging Reviewed Interpretation of Laboratory Data incorporated into ED treatment  The patient appears reasonably screened and/or stabilized for discharge and I doubt any other medical condition or other [facility] requiring further screening, evaluation, or treatment in the ED at this time prior to discharge.  Plan: Home Medications- usual; Home Treatments- rest; return here if the recommended treatment, does not improve the symptoms; Recommended follow  up- PCP 1 week    Flint MelterElliott L Wentz, MD 11/09/13 2225

## 2013-11-09 NOTE — Telephone Encounter (Signed)
Son calling again about mother, stated thinks might of had stroke and didn't know what to do.  Still numbness in left hand cannot grip anything.  Bp readings are as follows: Sunday, 6/13  - 124/61 am     117/54 pm  Monday, 6/14 - 137/55 am    141/73 pm  Tuesday, 6/15 - 112/58 am      15 7/74 pm  I explained Lawson FiscalLori is in meetings all week but would like to know if can test mom for stroke, any blood work.  I stated will let Lawson FiscalLori know and get back to son today.

## 2013-11-10 ENCOUNTER — Telehealth: Payer: Self-pay | Admitting: Nurse Practitioner

## 2013-11-10 NOTE — Telephone Encounter (Signed)
Son report's ct scan, no stroke and no bleeding.  Aware of ov on Monday, June 22 @ 2:00, cancelled nurse's visit for Friday per Norma FredricksonLori Gerhardt, NP

## 2013-11-10 NOTE — Telephone Encounter (Signed)
follow up      Pt's son called and stated was told by lori to report to Daniell of pt's progress.

## 2013-11-14 ENCOUNTER — Ambulatory Visit (INDEPENDENT_AMBULATORY_CARE_PROVIDER_SITE_OTHER): Payer: Medicare Other | Admitting: Nurse Practitioner

## 2013-11-14 ENCOUNTER — Encounter: Payer: Self-pay | Admitting: Nurse Practitioner

## 2013-11-14 ENCOUNTER — Encounter (INDEPENDENT_AMBULATORY_CARE_PROVIDER_SITE_OTHER): Payer: Self-pay

## 2013-11-14 VITALS — BP 142/64 | HR 70 | Temp 97.1°F | Ht 62.0 in | Wt 104.0 lb

## 2013-11-14 DIAGNOSIS — M6281 Muscle weakness (generalized): Secondary | ICD-10-CM

## 2013-11-14 DIAGNOSIS — R29898 Other symptoms and signs involving the musculoskeletal system: Secondary | ICD-10-CM

## 2013-11-14 DIAGNOSIS — I10 Essential (primary) hypertension: Secondary | ICD-10-CM

## 2013-11-14 LAB — BASIC METABOLIC PANEL
BUN: 16 mg/dL (ref 6–23)
CO2: 30 mEq/L (ref 19–32)
Calcium: 10 mg/dL (ref 8.4–10.5)
Chloride: 99 mEq/L (ref 96–112)
Creatinine, Ser: 0.9 mg/dL (ref 0.4–1.2)
GFR: 58.6 mL/min — ABNORMAL LOW (ref >60.00)
Glucose, Bld: 106 mg/dL — ABNORMAL HIGH (ref 70–99)
Potassium: 4.2 mEq/L (ref 3.5–5.1)
Sodium: 137 mEq/L (ref 135–145)

## 2013-11-14 NOTE — Patient Instructions (Addendum)
We need to recheck lab today  Stay on current medicines - may take the other half of the Lisinopril 2.5 mg if BP consistently staying above 170 systolic  Will try to not use the fluid pill  I will see you back as planned  Call [facility] office at [phone] if you have any questions, problems or concerns.

## 2013-11-14 NOTE — Progress Notes (Signed)
[name] Date of Birth: 11-08-1914 Medical Record #[id number]  History of Present Illness: Darlyn is seen back today for a post ER check. Seen for Dr. Elease Hashimoto. Former patient of Dr. Ronnald Nian. Has a remote history of syncope with a past loop record implant that turned out to be unremarkable. This was many years ago. Other issues include PAF, first degree AV block, HTN, cerebrovascular disease, OA, hypothyroidism and advanced age.   Was admitted to the hospital in December 2013 with a recurrent syncopal spell. She had been cooking in the kitchen and got weak and went to sit down. She lost consciousness and was out for about 30 to 60 seconds, then regained consciousness. No seizure, HA, weakness, etc. EMS was called. BP was low (60's/40's). She had a sinus pause on EKG of about 2.5 seconds (daughter has brought in strips for review). She had been fairly active and had done 3 loads of laundry that day. Dr. Elease Hashimoto saw her and felt that this was orthostasis/dehydation. Her lasix was stopped. Her beta blocker was stopped. BP is to be more liberal and we are not to achieve aggressive control. She was given some IV fluids. Her discharge summary mentions atrial fib during that time but all of her EKGs that I see show sinus rhythm. His suggestion was to not be aggressive with her blood pressure control and to use support stockings.   Seen back in October of 2014. Was doing ok for the most part. Had been put back on her low dose beta blocker (atenolol 12.5 mg) due to having headaches and I stopped it - I felt her headaches were more related to arthritis. Called in early January with elevated BP - added back 10 mg of Lasix just if systolic above 175.   Last seen here at the end of April. Was basically doing ok.  2 recent ER visits - BP sky high. Low dose ACE started. Then having some left hand weakness/numbness. Sent back to the ER due to concerns for a stroke - BP running low and I cut the ACE back. Thus  added to my schedule for follow up today.   Comes back today. Here with family. Not feeling well today. Has been vomiting all day today - taking tramadol for her back and does not remember taking any phenergan with it. Has to use the pediatric BP cuff. Readings at home look ok since cutting the ACE back - not too low anymore. Left hand "all better".    Current Outpatient Prescriptions  Medication Sig Dispense Refill  . aspirin 81 MG tablet Take 81 mg by mouth at bedtime.       . Cholecalciferol (VITAMIN D3) 1000 UNITS CAPS Take 1,000 Units by mouth 2 (two) times daily.       . Flaxseed, Linseed, (FLAX SEED OIL PO) Take 1 tablet by mouth 3 (three) times daily.       . furosemide (LASIX) 20 MG tablet Take 0.5 tablets (10 mg total) by mouth as needed (take (10 mg ) if top number of bp is greater than 175).  15 tablet  0  . levothyroxine (SYNTHROID, LEVOTHROID) 75 MCG tablet Take 75 mcg by mouth daily before breakfast.      . lisinopril (PRINIVIL,ZESTRIL) 5 MG tablet Take 5 mg by mouth.      Marland Kitchen MAGNESIUM OXIDE PO Take 250 mg by mouth daily.      . Multiple Vitamins-Minerals (OCUVITE PRESERVISION PO) Take 1 tablet by mouth 2 (two)  times daily.       . promethazine (PHENERGAN) 25 MG tablet Take 25 mg by mouth every 6 (six) hours as needed. For nausea      . traMADol (ULTRAM) 50 MG tablet Take 50 mg by mouth every 6 (six) hours as needed. For pain      . triamcinolone (KENALOG) 0.025 % cream Apply 1 application topically as needed (for rash).        No current facility-administered medications for this visit.    Allergies  Allergen Reactions  . Ciprocin-Fluocin-Procin [Fluocinolone Acetonide] Nausea And Vomiting  . Ciprofloxacin Hcl Nausea And Vomiting  . Codeine Hives and Nausea And Vomiting  . Sulfa Drugs Cross Reactors Hives and Nausea And Vomiting    Past Medical History  Diagnosis Date  . Syncope and collapse     Negative loop recorder in the past  . Hypertension   . AV block, 1st  degree   . CVD (cerebrovascular disease)     History of CVA  . Hypokalemia   . Diarrhea   . Atrial fibrillation     not a candidate for coumadin due to falls  . Pneumonia 09/11  . Traumatic enucleation of eye     left eye  . History of echocardiogram 9/11    normal EF, mild to mod MR & TR, mod pulmonary HTN  . Varicose veins   . Advanced age   . Prediabetes   . Hypothyroidism   . GERD (gastroesophageal reflux disease)   . Osteoarthritis     Past Surgical History  Procedure Laterality Date  . Back surgery    . Inguinal hernia repair      left sided    History  Smoking status  . Never Smoker   Smokeless tobacco  . Not on file    History  Alcohol Use No    Family History  Problem Relation Age of Onset  . Heart attack Father   . Heart attack Brother   . Cancer Brother   . Stroke Brother     Review of Systems: The review of systems is per the HPI.  All other systems were reviewed and are negative.  Physical Exam: BP 142/64  Pulse 70  Temp(Src) 97.1 F (36.2 C)  Ht 5\' 2"  (1.575 m)  Wt 104 lb (47.174 kg)  BMI 19.02 kg/m2  SpO2 98% BP by me is 170/70. Her cuff correlates.  Patient is very pleasant and in no acute distress. Skin is warm and dry. Color is normal.  HEENT is unremarkable but has a deformity with her eye. Normocephalic/atraumatic. PERRL. Sclera are nonicteric. Neck is supple. No masses. No JVD. Lungs are clear. Cardiac exam shows an irregular rhythm. Rate is ok. Abdomen is soft. Extremities are without edema. Gait and ROM are intact. No gross neurologic deficits noted.  LABORATORY DATA:  Lab Results  Component Value Date   WBC 8.0 11/09/2013   HGB 13.3 11/09/2013   HCT 39.0 11/09/2013   PLT 300 11/09/2013   GLUCOSE 84 11/09/2013   CHOL 185 09/27/2013   TRIG 155* 09/27/2013   HDL 58 09/27/2013   LDLCALC 96 09/27/2013   ALT 21 11/09/2013   AST 36 11/09/2013   NA 134* 11/09/2013   K 4.4 11/09/2013   CL 106 11/09/2013   CREATININE 1.10 11/09/2013   BUN 22  11/09/2013   CO2 27 11/09/2013   TSH 0.771 09/27/2013   INR 0.99 11/09/2013   HGBA1C 5.6 09/27/2013  BNP (last 3 results) No results found for this basename: PROBNP,  in the last 8760 hours  Ct Head (brain) Wo Contrast  11/09/2013    IMPRESSION: No acute intracranial abnormality. Chronic and involutional changes.   Electronically Signed   By: Salome HolmesHector  Cooper M.D.   On: 11/09/2013 21:42   Assessment / Plan:  1. HTN - I think her BP is ok. She has not had her Lisinopril yet today - will resume. They will continue to monitor. See her back as planned. Recheck BMET today as well.   2. Headaches - does not seem to be an issue at this time.   3. Syncope - not recurred.   4. Left hand numbness/weakness - ? TIA - negative CT scan. Resolved at this time.   5. Advanced age   16. PAF - not a candidate for anticoagulation given age and unsteadiness.   Patient is agreeable to this plan and will call if any problems develop in the interim.   Rosalio MacadamiaLori C. Gerhardt, RN, ANP-C  [facility]  [address]  [place], [place] [address]  [phone]

## 2013-11-21 ENCOUNTER — Other Ambulatory Visit: Payer: Self-pay | Admitting: Cardiology

## 2013-11-23 ENCOUNTER — Other Ambulatory Visit: Payer: Self-pay | Admitting: Cardiology

## 2013-11-23 NOTE — Telephone Encounter (Signed)
Ok to refill or should this be sent to pcp? Please advise. Thanks, MI

## 2013-11-24 ENCOUNTER — Other Ambulatory Visit: Payer: Self-pay | Admitting: *Deleted

## 2013-11-24 MED ORDER — LEVOTHYROXINE SODIUM 75 MCG PO TABS: 75.0000 ug | ORAL_TABLET | Freq: Every day | ORAL | Status: DC

## 2013-12-26 ENCOUNTER — Other Ambulatory Visit: Payer: Self-pay

## 2013-12-26 MED ORDER — LISINOPRIL 5 MG PO TABS: 2.5000 mg | ORAL_TABLET | Freq: Every day | ORAL | Status: DC

## 2014-01-24 ENCOUNTER — Ambulatory Visit: Payer: Medicare Other

## 2014-02-22 ENCOUNTER — Ambulatory Visit (INDEPENDENT_AMBULATORY_CARE_PROVIDER_SITE_OTHER): Payer: Medicare Other | Admitting: *Deleted

## 2014-02-22 DIAGNOSIS — Z23 Encounter for immunization: Secondary | ICD-10-CM

## 2014-02-25 ENCOUNTER — Encounter (HOSPITAL_COMMUNITY): Payer: Self-pay | Admitting: Emergency Medicine

## 2014-02-25 ENCOUNTER — Emergency Department (HOSPITAL_COMMUNITY)
Admission: EM | Admit: 2014-02-25 | Discharge: 2014-02-25 | Disposition: A | Discharge status: Home / Self Care | Payer: Medicare Other | Attending: Emergency Medicine | Admitting: Emergency Medicine

## 2014-02-25 ENCOUNTER — Emergency Department (HOSPITAL_COMMUNITY): Payer: Medicare Other

## 2014-02-25 DIAGNOSIS — I10 Essential (primary) hypertension: Secondary | ICD-10-CM | POA: Insufficient documentation

## 2014-02-25 DIAGNOSIS — W1839XA Other fall on same level, initial encounter: Secondary | ICD-10-CM | POA: Diagnosis not present

## 2014-02-25 DIAGNOSIS — M1990 Unspecified osteoarthritis, unspecified site: Secondary | ICD-10-CM | POA: Insufficient documentation

## 2014-02-25 DIAGNOSIS — Z8701 Personal history of pneumonia (recurrent): Secondary | ICD-10-CM | POA: Diagnosis not present

## 2014-02-25 DIAGNOSIS — E039 Hypothyroidism, unspecified: Secondary | ICD-10-CM | POA: Diagnosis not present

## 2014-02-25 DIAGNOSIS — Y9389 Activity, other specified: Secondary | ICD-10-CM | POA: Diagnosis not present

## 2014-02-25 DIAGNOSIS — Y92010 Kitchen of single-family (private) house as the place of occurrence of the external cause: Secondary | ICD-10-CM | POA: Insufficient documentation

## 2014-02-25 DIAGNOSIS — Z7982 Long term (current) use of aspirin: Secondary | ICD-10-CM | POA: Diagnosis not present

## 2014-02-25 DIAGNOSIS — S59902A Unspecified injury of left elbow, initial encounter: Secondary | ICD-10-CM | POA: Insufficient documentation

## 2014-02-25 DIAGNOSIS — I4891 Unspecified atrial fibrillation: Secondary | ICD-10-CM | POA: Insufficient documentation

## 2014-02-25 DIAGNOSIS — W19XXXA Unspecified fall, initial encounter: Secondary | ICD-10-CM

## 2014-02-25 DIAGNOSIS — S40012A Contusion of left shoulder, initial encounter: Secondary | ICD-10-CM | POA: Diagnosis not present

## 2014-02-25 DIAGNOSIS — S4992XA Unspecified injury of left shoulder and upper arm, initial encounter: Secondary | ICD-10-CM | POA: Diagnosis present

## 2014-02-25 DIAGNOSIS — Z8673 Personal history of transient ischemic attack (TIA), and cerebral infarction without residual deficits: Secondary | ICD-10-CM | POA: Insufficient documentation

## 2014-02-25 DIAGNOSIS — Z79899 Other long term (current) drug therapy: Secondary | ICD-10-CM | POA: Diagnosis not present

## 2014-02-25 MED ORDER — ACETAMINOPHEN 325 MG PO TABS
650.0000 mg | ORAL_TABLET | Freq: Once | ORAL | Status: AC
  Administered 2014-02-25: 650 mg via ORAL
  Filled 2014-02-25: qty 2

## 2014-02-25 NOTE — ED Provider Notes (Addendum)
CSN: [id number]     Arrival date & time 02/25/14  0944 History   First MD Initiated Contact with Patient 02/25/14 1111     Chief Complaint  Patient presents with  . Fall  . Shoulder Pain  . Arm Pain     (Consider location/radiation/quality/duration/timing/severity/associated sxs/prior Treatment) HPI Comments: Patient was standing in her kitchen and twisted to grab something and lost her balance falling on her left side. She denies any injury to her head or neck. No LOC. She's been able to walk since the fall without difficulty  Patient is a 80 y.o. female presenting with fall, shoulder pain, and arm pain. The history is provided by the patient.  Fall This is a new problem. The current episode started 1 to 2 hours ago. The problem occurs constantly. The problem has not changed since onset.Pertinent negatives include no chest pain, no abdominal pain and no headaches. Associated symptoms comments: Left shoulder and elbow pain . Nothing aggravates the symptoms. Nothing relieves the symptoms. She has tried nothing for the symptoms. The treatment provided no relief.  Shoulder Pain Pertinent negatives include no chest pain, no abdominal pain and no headaches.  Arm Pain Pertinent negatives include no chest pain, no abdominal pain and no headaches.    Past Medical History  Diagnosis Date  . Syncope and collapse     Negative loop recorder in the past  . Hypertension   . AV block, 1st degree   . CVD (cerebrovascular disease)     History of CVA  . Hypokalemia   . Diarrhea   . Atrial fibrillation     not a candidate for coumadin due to falls  . Pneumonia 09/11  . Traumatic enucleation of eye     left eye  . History of echocardiogram 9/11    normal EF, mild to mod MR & TR, mod pulmonary HTN  . Varicose veins   . Advanced age   . Prediabetes   . Hypothyroidism   . GERD (gastroesophageal reflux disease)   . Osteoarthritis    Past Surgical History  Procedure Laterality Date  . Back  surgery    . Inguinal hernia repair      left sided   Family History  Problem Relation Age of Onset  . Heart attack Father   . Heart attack Brother   . Cancer Brother   . Stroke Brother    History  Substance Use Topics  . Smoking status: Never Smoker   . Smokeless tobacco: Not on file  . Alcohol Use: No   OB History   Grav Para Term Preterm Abortions TAB SAB Ect Mult Living                 Review of Systems  Cardiovascular: Negative for chest pain.  Gastrointestinal: Negative for abdominal pain.  Neurological: Negative for headaches.  All other systems reviewed and are negative.     Allergies  Ciprocin-fluocin-procin; Ciprofloxacin hcl; Codeine; and Sulfa drugs cross reactors  Home Medications   Prior to Admission medications   Medication Sig Start Date End Date Taking? Authorizing Provider  aspirin 81 MG tablet Take 81 mg by mouth at bedtime.     Historical Provider, MD  Cholecalciferol (VITAMIN D3) 1000 UNITS CAPS Take 1,000 Units by mouth 2 (two) times daily.     Historical Provider, MD  Flaxseed, Linseed, (FLAX SEED OIL PO) Take 1 tablet by mouth 3 (three) times daily.     Historical Provider, MD  furosemide (LASIX)  20 MG tablet Take 0.5 tablets (10 mg total) by mouth as needed (take (10 mg ) if top number of bp is greater than 175). 05/30/13   Rosalio Macadamia, NP  levothyroxine (SYNTHROID, LEVOTHROID) 75 MCG tablet Take 1 tablet (75 mcg total) by mouth daily before breakfast. 11/24/13   Lucky Cowboy, MD  lisinopril (PRINIVIL,ZESTRIL) 5 MG tablet Take 0.5 tablets (2.5 mg total) by mouth daily. 12/26/13   Vesta Mixer, MD  MAGNESIUM OXIDE PO Take 250 mg by mouth daily.    Historical Provider, MD  Multiple Vitamins-Minerals (OCUVITE PRESERVISION PO) Take 1 tablet by mouth 2 (two) times daily.     Historical Provider, MD  promethazine (PHENERGAN) 25 MG tablet Take 25 mg by mouth every 6 (six) hours as needed. For nausea    Historical Provider, MD  traMADol (ULTRAM) 50  MG tablet Take 50 mg by mouth every 6 (six) hours as needed. For pain    Historical Provider, MD  triamcinolone (KENALOG) 0.025 % cream Apply 1 application topically as needed (for rash).     Historical Provider, MD   BP 188/79  Pulse 79  Resp 18  Ht 5\' 2"  (1.575 m)  Wt 106 lb (48.081 kg)  BMI 19.38 kg/m2  SpO2 98% Physical Exam  Nursing note and vitals reviewed. Constitutional: She is oriented to person, place, and time. She appears well-developed and well-nourished. No distress.  HENT:  Head: Normocephalic and atraumatic.  Eyes: EOM are normal. Pupils are equal, round, and reactive to light.  Neck: No spinous process tenderness and no muscular tenderness present.  Cardiovascular: Normal rate.   Pulmonary/Chest: Effort normal. She exhibits no tenderness.  Abdominal: Soft. There is no tenderness.  Musculoskeletal:       Left shoulder: She exhibits tenderness. She exhibits normal range of motion, no swelling, no effusion, no deformity, normal pulse and normal strength.       Left elbow: She exhibits normal range of motion, no effusion and no deformity. Tenderness found. Lateral epicondyle tenderness noted.       Arms: Neurological: She is alert and oriented to person, place, and time.  Skin: Skin is warm and dry. No rash noted. No erythema.  Psychiatric: She has a normal mood and affect. Her behavior is normal.    ED Course  Procedures (including critical care time) Labs Review Labs Reviewed - No data to display  Imaging Review Dg Elbow Complete Left  02/25/2014   CLINICAL DATA:  Fall onto floor this morning landing on left shoulder and elbow with resulting pain.  EXAM: LEFT ELBOW - COMPLETE 3+ VIEW  COMPARISON:  None.  FINDINGS: There is diffuse decreased bone mineralization. Minimal degenerative change over the elbow. No definite fracture or dislocation.  IMPRESSION: No acute findings.   Electronically Signed   By: Elberta Fortis M.D.   On: 02/25/2014 14:19   Dg Shoulder  Left  02/25/2014   CLINICAL DATA:  Fall this morning onto left shoulder with resulting left shoulder pain posteriorly.  EXAM: LEFT SHOULDER - 2+ VIEW  COMPARISON:  Chest x-ray 05/24/2012  FINDINGS: Mild degenerative change of the [facility] joint and glenohumeral joints. No acute fracture or dislocation.  IMPRESSION: No acute findings.   Electronically Signed   By: Elberta Fortis M.D.   On: 02/25/2014 14:18     EKG Interpretation None      MDM   Final diagnoses:  Fall, initial encounter  Shoulder contusion, left, initial encounter    Patient with a  mechanical fall today with a complaint of left shoulder and elbow pain. She had no other injuries. No head pain, neck pain. She denies any LOC. She was able to ambulate without difficulty no significant pain in the lower extremities. Plain films of the left shoulder and elbow pending.  2:23 PM Imaging neg will d/c pt home.    Gwyneth Sprout, MD 02/25/14 1423  Gwyneth Sprout, MD 02/25/14 1424

## 2014-02-25 NOTE — Discharge Instructions (Signed)
Contusion °A contusion is a deep bruise. Contusions happen when an injury causes bleeding under the skin. Signs of bruising include pain, puffiness (swelling), and discolored skin. The contusion may turn blue, purple, or yellow. °HOME CARE  °· Put ice on the injured area. °¨ Put ice in a plastic bag. °¨ Place a towel between your skin and the bag. °¨ Leave the ice on for 15-20 minutes, 03-04 times a day. °· Only take medicine as told by your doctor. °· Rest the injured area. °· If possible, raise (elevate) the injured area to lessen puffiness. °GET HELP RIGHT AWAY IF:  °· You have more bruising or puffiness. °· You have pain that is getting worse. °· Your puffiness or pain is not helped by medicine. °MAKE SURE YOU:  °· Understand these instructions. °· Will watch your condition. °· Will get help right away if you are not doing well or get worse. °Document Released: 10/29/2007 Document Revised: 08/04/2011 Document Reviewed: 03/17/2011 °ExitCare® Patient Information ©2015 [employer]. This information is not intended to replace advice given to you by your health care provider. Make sure you discuss any questions you have with your health care provider. ° °

## 2014-02-25 NOTE — ED Notes (Signed)
Pt fell this morning unknown how. Pt reports that she turned around to throw something in the trash. Pt c/o pain to left shoulder and arm.

## 2014-03-10 ENCOUNTER — Other Ambulatory Visit: Payer: Self-pay | Admitting: Internal Medicine

## 2014-03-13 ENCOUNTER — Other Ambulatory Visit: Payer: Self-pay | Admitting: Internal Medicine

## 2014-03-14 ENCOUNTER — Encounter: Payer: Self-pay | Admitting: Nurse Practitioner

## 2014-03-14 ENCOUNTER — Ambulatory Visit (INDEPENDENT_AMBULATORY_CARE_PROVIDER_SITE_OTHER): Payer: Medicare Other | Admitting: Nurse Practitioner

## 2014-03-14 VITALS — BP 160/80 | HR 92 | Ht 62.0 in | Wt 107.8 lb

## 2014-03-14 DIAGNOSIS — I10 Essential (primary) hypertension: Secondary | ICD-10-CM

## 2014-03-14 DIAGNOSIS — W19XXXD Unspecified fall, subsequent encounter: Secondary | ICD-10-CM

## 2014-03-14 NOTE — Progress Notes (Signed)
Cecilie LowersAlma M Seifert Date of Birth: 08/27/1914 Medical Record #[id number]  History of Present Illness: Aniceto Bosslma is seen back today for a follow up visit.  Seen for Dr. Elease HashimotoNahser. Former patient of Dr. Ronnald Nianennant's. Has a remote history of syncope with a past loop record implant that turned out to be unremarkable. This was many years ago. Other issues include PAF, first degree AV block, HTN, cerebrovascular disease, OA, hypothyroidism and advanced age.   Was admitted to the hospital in December 2013 with a recurrent syncopal spell. She had been cooking in the kitchen and got weak and went to sit down. She lost consciousness and was out for about 30 to 60 seconds, then regained consciousness. No seizure, HA, weakness, etc. EMS was called. BP was low (60's/40's). She had a sinus pause on EKG of about 2.5 seconds (daughter has brought in strips for review). She had been fairly active and had done 3 loads of laundry that day. Dr. Elease HashimotoNahser saw her and felt that this was orthostasis/dehydation. Her lasix was stopped. Her beta blocker was stopped. BP is to be more liberal and we are not to achieve aggressive control. She was given some IV fluids. Her discharge summary mentions atrial fib during that time but all of her EKGs that I see show sinus rhythm. His suggestion was to not be aggressive with her blood pressure control and to use support stockings.   Seen back in October of 2014. Was doing ok for the most part. Had been put back on her low dose beta blocker (atenolol 12.5 mg) due to having headaches and I stopped it - I felt her headaches were more related to arthritis. Called in early January with elevated BP - added back 10 mg of Lasix just if systolic above 175.   2 prior ER visits back in the spring - BP sky high. Low dose ACE started. Then having some left hand weakness/numbness. Sent back to the ER due to concerns for a stroke - BP running low and I cut the ACE back.  I last saw her in June. She seemed to be holding  her own for the most part.  Comes in today. Here with her son. Her daughter and her husband have gone to the mountains. Has turned 80 years old.  Has fallen again. 2 falls in the last 5 weeks. Negative Xray of elbow and shoulder. Her back hurts - everyday at 12 o'clock. Did not get her back Xrayed.  Heating pad helps her. Taking Tramadol. She is still doing some cooking - does not seem to really be slowing down. BP good at home for the most part.   Current Outpatient Prescriptions  Medication Sig Dispense Refill  . aspirin 81 MG tablet Take 81 mg by mouth at bedtime.       . Cholecalciferol (VITAMIN D3) 1000 UNITS CAPS Take 1,000 Units by mouth 2 (two) times daily.       . Flaxseed, Linseed, (FLAX SEED OIL PO) Take 1 tablet by mouth 2 (two) times daily.       Marland Kitchen. levothyroxine (SYNTHROID, LEVOTHROID) 75 MCG tablet Take 1 tablet (75 mcg total) by mouth daily before breakfast.  90 tablet  1  . lisinopril (PRINIVIL,ZESTRIL) 5 MG tablet Take 0.5 tablets (2.5 mg total) by mouth daily.  30 tablet  6  . MAGNESIUM OXIDE PO Take 250 mg by mouth daily.      . Multiple Vitamins-Minerals (OCUVITE PRESERVISION PO) Take 1 tablet by mouth 2 (two) times  daily.       . promethazine (PHENERGAN) 25 MG tablet TAKE 1 TABLET BY MOUTH 3-4 TIMES A DAY AS NEEDED FOR NAUSEA  30 tablet  0  . traMADol (ULTRAM) 50 MG tablet TAKE 1 TABLET BY MOUTH 3-4 TIMES A DAY AS NEEDED FOR PAIN  90 tablet  0  . triamcinolone (KENALOG) 0.025 % cream Apply 1 application topically as needed (for rash).        No current facility-administered medications for this visit.    Allergies  Allergen Reactions  . Ciprocin-Fluocin-Procin [Fluocinolone Acetonide] Nausea And Vomiting  . Ciprofloxacin Hcl Nausea And Vomiting  . Codeine Hives and Nausea And Vomiting  . Sulfa Drugs Cross Reactors Hives and Nausea And Vomiting    Past Medical History  Diagnosis Date  . Syncope and collapse     Negative loop recorder in the past  . Hypertension     . AV block, 1st degree   . CVD (cerebrovascular disease)     History of CVA  . Hypokalemia   . Diarrhea   . Atrial fibrillation     not a candidate for coumadin due to falls  . Pneumonia 09/11  . Traumatic enucleation of eye     left eye  . History of echocardiogram 9/11    normal EF, mild to mod MR & TR, mod pulmonary HTN  . Varicose veins   . Advanced age   . Prediabetes   . Hypothyroidism   . GERD (gastroesophageal reflux disease)   . Osteoarthritis     Past Surgical History  Procedure Laterality Date  . Back surgery    . Inguinal hernia repair      left sided    History  Smoking status  . Never Smoker   Smokeless tobacco  . Not on file    History  Alcohol Use No    Family History  Problem Relation Age of Onset  . Heart attack Father   . Heart attack Brother   . Cancer Brother   . Stroke Brother     Review of Systems: The review of systems is per the HPI.  All other systems were reviewed and are negative.  Physical Exam: BP 160/80  Pulse 92  Ht 5\' 2"  (1.575 m)  Wt 107 lb 12.8 oz (48.898 kg)  BMI 19.71 kg/m2  SpO2 100% Patient is very pleasant and in no acute distress. Skin is warm and dry. Color is normal.  HEENT is unremarkable. Normocephalic/atraumatic. PERRL. Sclera are nonicteric. Neck is supple. No masses. No JVD. Lungs are clear. Cardiac exam shows an  irregular rhythm. Rate ok today. Abdomen is soft. Extremities are without edema. Gait and ROM are intact. No gross neurologic deficits noted.  Wt Readings from Last 3 Encounters:  03/14/14 107 lb 12.8 oz (48.898 kg)  02/25/14 106 lb (48.081 kg)  11/14/13 104 lb (47.174 kg)    LABORATORY DATA/PROCEDURES:  Lab Results  Component Value Date   WBC 8.0 11/09/2013   HGB 13.3 11/09/2013   HCT 39.0 11/09/2013   PLT 300 11/09/2013   GLUCOSE 106* 11/14/2013   CHOL 185 09/27/2013   TRIG 155* 09/27/2013   HDL 58 09/27/2013   LDLCALC 96 09/27/2013   ALT 21 11/09/2013   AST 36 11/09/2013   NA 137 11/14/2013    K 4.2 11/14/2013   CL 99 11/14/2013   CREATININE 0.9 11/14/2013   BUN 16 11/14/2013   CO2 30 11/14/2013   TSH 0.771 09/27/2013  INR 0.99 11/09/2013   HGBA1C 5.6 09/27/2013    BNP (last 3 results) No results found for this basename: PROBNP,  in the last 8760 hours   Assessment / Plan:  1. HTN - no change with her current regimen.  2. Headaches - does not seem to be an issue at this time.   3. Syncope - not recurred.   4. Falls - progressive back pain - will try some Thermacare patches between now and the time she sees PCP next week. Will defer back Xrays to PCP.  5. Advanced age   63. PAF - not a candidate for anticoagulation given age and unsteadiness.   Patient is agreeable to this plan and will call if any problems develop in the interim.   Rosalio Macadamia, RN, ANP-C [facility] [address] [place], [place]  [address] [phone]

## 2014-03-14 NOTE — Patient Instructions (Signed)
I will see you in 6 months  Try the Thermacare patches between now and the time of your visit with Amanda  Call the [facility] office at [phone] if you have any questions, problems or concerns.

## 2014-03-16 ENCOUNTER — Ambulatory Visit (INDEPENDENT_AMBULATORY_CARE_PROVIDER_SITE_OTHER): Payer: Medicare Other | Admitting: Physician Assistant

## 2014-03-16 ENCOUNTER — Encounter: Payer: Self-pay | Admitting: Physician Assistant

## 2014-03-16 VITALS — BP 132/68 | HR 80 | Temp 98.3°F | Resp 16 | Ht 62.0 in | Wt 107.0 lb

## 2014-03-16 DIAGNOSIS — R350 Frequency of micturition: Secondary | ICD-10-CM

## 2014-03-16 DIAGNOSIS — R109 Unspecified abdominal pain: Secondary | ICD-10-CM

## 2014-03-16 DIAGNOSIS — R0600 Dyspnea, unspecified: Secondary | ICD-10-CM

## 2014-03-16 LAB — COMPREHENSIVE METABOLIC PANEL
ALBUMIN: 4.4 g/dL (ref 3.5–5.2)
ALK PHOS: 53 U/L (ref 39–117)
ALT: 22 U/L (ref 0–35)
AST: 24 U/L (ref 0–37)
BUN: 19 mg/dL (ref 6–23)
CO2: 26 meq/L (ref 19–32)
Calcium: 9.9 mg/dL (ref 8.4–10.5)
Chloride: 101 mEq/L (ref 96–112)
Creat: 0.95 mg/dL (ref 0.50–1.10)
Glucose, Bld: 91 mg/dL (ref 70–99)
POTASSIUM: 4.2 meq/L (ref 3.5–5.3)
SODIUM: 138 meq/L (ref 135–145)
TOTAL PROTEIN: 7 g/dL (ref 6.0–8.3)
Total Bilirubin: 0.9 mg/dL (ref 0.2–1.2)

## 2014-03-16 LAB — CBC WITH DIFFERENTIAL/PLATELET
Basophils Absolute: 0.1 10*3/uL (ref 0.0–0.1)
Basophils Relative: 1 % (ref 0–1)
Eosinophils Absolute: 0.5 10*3/uL (ref 0.0–0.7)
Eosinophils Relative: 6 % — ABNORMAL HIGH (ref 0–5)
HCT: 37.6 % (ref 36.0–46.0)
HEMOGLOBIN: 12.5 g/dL (ref 12.0–15.0)
LYMPHS ABS: 3 10*3/uL (ref 0.7–4.0)
Lymphocytes Relative: 35 % (ref 12–46)
MCH: 30.9 pg (ref 26.0–34.0)
MCHC: 33.2 g/dL (ref 30.0–36.0)
MCV: 93.1 fL (ref 78.0–100.0)
MONOS PCT: 13 % — AB (ref 3–12)
Monocytes Absolute: 1.1 10*3/uL — ABNORMAL HIGH (ref 0.1–1.0)
NEUTROS ABS: 3.9 10*3/uL (ref 1.7–7.7)
NEUTROS PCT: 45 % (ref 43–77)
Platelets: 332 10*3/uL (ref 150–400)
RBC: 4.04 MIL/uL (ref 3.87–5.11)
RDW: 12.5 % (ref 11.5–15.5)
WBC: 8.6 10*3/uL (ref 4.0–10.5)

## 2014-03-16 MED ORDER — AMOXICILLIN 500 MG PO CAPS: 500.0000 mg | ORAL_CAPSULE | Freq: Three times a day (TID) | ORAL | Status: DC

## 2014-03-16 NOTE — Progress Notes (Addendum)
   Subjective:    Patient ID: [name], female    DOB: 02/23/1915, 80 y.o.   MRN: [id number]  HPI 80 y.o. female presents with left flank pain and falls for the past month. She started to take pain medications that did not help, she went to her ortho but they said that it was not her back. She is here to get her urine changed. Her son states she has been falling asleep more often. Constant left flank pain, worse at time, wakes her at times, For a few days she had frequency q 30 mins with some hesitancy, denies fever, nausea, decreased appetite. Some subjective chills. Some dyspnea with exertion.    Review of Systems  Constitutional: Positive for fatigue. Negative for fever, chills and appetite change.  HENT: Negative.   Respiratory: Positive for shortness of breath. Negative for cough, chest tightness and wheezing.   Cardiovascular: Negative.   Gastrointestinal: Negative.   Genitourinary: Positive for frequency and flank pain. Negative for dysuria and dyspareunia.  Neurological: Negative.        Objective:   Physical Exam  Constitutional: She is oriented to person, place, and time. She appears well-developed and well-nourished.  HENT:  Head: Normocephalic and atraumatic.  Neck: Normal range of motion. Neck supple.  Cardiovascular: Normal rate and regular rhythm.   Pulmonary/Chest: Effort normal. She has wheezes (left lower lobe).  Abdominal: Soft. Bowel sounds are normal. She exhibits distension. There is tenderness (suprapubic).  Musculoskeletal: Normal range of motion.  Neurological: She is alert and oriented to person, place, and time.      Assessment & Plan:  Left flank pain- some dyspnea and wheezing left lower lobe, get CXR rule out pneumonia- possible UTI/kidney stone- check urine, CBC, CMET- amoxicillin sent in for possible UTI.  Addendum: + CXR with infection LLL, will switch to augmentin for better coverage. Patient informed, follow up 1 month.

## 2014-03-17 ENCOUNTER — Ambulatory Visit (HOSPITAL_COMMUNITY)
Admission: RE | Admit: 2014-03-17 | Discharge: 2014-03-17 | Disposition: A | Discharge status: Home / Self Care | Payer: Medicare Other | Source: Ambulatory Visit | Attending: Physician Assistant | Admitting: Physician Assistant

## 2014-03-17 DIAGNOSIS — R062 Wheezing: Secondary | ICD-10-CM | POA: Insufficient documentation

## 2014-03-17 DIAGNOSIS — J984 Other disorders of lung: Secondary | ICD-10-CM | POA: Diagnosis not present

## 2014-03-17 DIAGNOSIS — R109 Unspecified abdominal pain: Secondary | ICD-10-CM

## 2014-03-17 DIAGNOSIS — R0600 Dyspnea, unspecified: Secondary | ICD-10-CM

## 2014-03-17 LAB — URINALYSIS, ROUTINE W REFLEX MICROSCOPIC
BILIRUBIN URINE: NEGATIVE
Glucose, UA: NEGATIVE mg/dL
Nitrite: NEGATIVE
Protein, ur: NEGATIVE mg/dL
Specific Gravity, Urine: 1.008 (ref 1.005–1.030)
Urobilinogen, UA: 0.2 mg/dL (ref 0.0–1.0)
pH: 8 (ref 5.0–8.0)

## 2014-03-17 LAB — URINALYSIS, MICROSCOPIC ONLY
Bacteria, UA: NONE SEEN
CASTS: NONE SEEN
Crystals: NONE SEEN
Squamous Epithelial / LPF: NONE SEEN

## 2014-03-17 MED ORDER — AMOXICILLIN-POT CLAVULANATE 875-125 MG PO TABS: 1.0000 | ORAL_TABLET | Freq: Two times a day (BID) | ORAL | Status: DC

## 2014-03-17 NOTE — Addendum Note (Signed)
Addended by: Quentin MullingOLLIER, Delano Frate R on: 03/17/2014 10:30 AM   Modules accepted: Orders, Medications

## 2014-03-17 NOTE — Addendum Note (Signed)
Addended by: Quentin MullingOLLIER, AMANDA R on: 03/17/2014 11:42 AM   Modules accepted: Orders

## 2014-03-18 LAB — URINE CULTURE
COLONY COUNT: NO GROWTH
Organism ID, Bacteria: NO GROWTH

## 2014-03-21 ENCOUNTER — Telehealth: Payer: Self-pay

## 2014-03-21 ENCOUNTER — Other Ambulatory Visit: Payer: Self-pay | Admitting: Physician Assistant

## 2014-03-21 MED ORDER — AZITHROMYCIN 250 MG PO TABS: ORAL_TABLET | ORAL | Status: AC

## 2014-03-21 NOTE — Telephone Encounter (Signed)
Spoke with patients daughter Moshe SalisburyMiranda Frazier [phone], advised of CT scan scheduled Thursday 03-23-14 which she was already aware of, advised her that the CT was certified via peer to peer by Quentin MullingAmanda Collier, PA. Also let her know the Urine culture was normal, she also was made aware that patients antibiotic was switched to Zpack due to diarrhea caused from 1st antibiotic, daughter verbalized understanding of all instructions.

## 2014-03-23 ENCOUNTER — Encounter: Payer: Self-pay | Admitting: Physician Assistant

## 2014-03-23 ENCOUNTER — Ambulatory Visit (HOSPITAL_COMMUNITY)
Admission: RE | Admit: 2014-03-23 | Discharge: 2014-03-23 | Disposition: A | Discharge status: Home / Self Care | Payer: Medicare Other | Source: Ambulatory Visit | Attending: Diagnostic Radiology | Admitting: Diagnostic Radiology

## 2014-03-23 ENCOUNTER — Encounter (HOSPITAL_COMMUNITY): Payer: Self-pay

## 2014-03-23 ENCOUNTER — Ambulatory Visit (INDEPENDENT_AMBULATORY_CARE_PROVIDER_SITE_OTHER): Payer: Medicare Other | Admitting: Physician Assistant

## 2014-03-23 VITALS — BP 138/70 | HR 84 | Temp 98.2°F | Resp 16 | Ht 62.0 in | Wt 109.0 lb

## 2014-03-23 DIAGNOSIS — E782 Mixed hyperlipidemia: Secondary | ICD-10-CM

## 2014-03-23 DIAGNOSIS — M1990 Unspecified osteoarthritis, unspecified site: Secondary | ICD-10-CM

## 2014-03-23 DIAGNOSIS — R7303 Prediabetes: Secondary | ICD-10-CM

## 2014-03-23 DIAGNOSIS — Z79899 Other long term (current) drug therapy: Secondary | ICD-10-CM

## 2014-03-23 DIAGNOSIS — I482 Chronic atrial fibrillation, unspecified: Secondary | ICD-10-CM

## 2014-03-23 DIAGNOSIS — R109 Unspecified abdominal pain: Secondary | ICD-10-CM | POA: Diagnosis present

## 2014-03-23 DIAGNOSIS — R6889 Other general symptoms and signs: Secondary | ICD-10-CM

## 2014-03-23 DIAGNOSIS — E039 Hypothyroidism, unspecified: Secondary | ICD-10-CM

## 2014-03-23 DIAGNOSIS — K868 Other specified diseases of pancreas: Secondary | ICD-10-CM | POA: Insufficient documentation

## 2014-03-23 DIAGNOSIS — R350 Frequency of micturition: Secondary | ICD-10-CM

## 2014-03-23 DIAGNOSIS — I440 Atrioventricular block, first degree: Secondary | ICD-10-CM

## 2014-03-23 DIAGNOSIS — I10 Essential (primary) hypertension: Secondary | ICD-10-CM

## 2014-03-23 DIAGNOSIS — K219 Gastro-esophageal reflux disease without esophagitis: Secondary | ICD-10-CM

## 2014-03-23 DIAGNOSIS — Z0001 Encounter for general adult medical examination with abnormal findings: Secondary | ICD-10-CM

## 2014-03-23 DIAGNOSIS — E538 Deficiency of other specified B group vitamins: Secondary | ICD-10-CM

## 2014-03-23 DIAGNOSIS — E559 Vitamin D deficiency, unspecified: Secondary | ICD-10-CM

## 2014-03-23 DIAGNOSIS — I679 Cerebrovascular disease, unspecified: Secondary | ICD-10-CM

## 2014-03-23 LAB — CBC WITH DIFFERENTIAL/PLATELET
BASOS PCT: 1 % (ref 0–1)
Basophils Absolute: 0.1 10*3/uL (ref 0.0–0.1)
EOS ABS: 0.5 10*3/uL (ref 0.0–0.7)
EOS PCT: 7 % — AB (ref 0–5)
HEMATOCRIT: 34.8 % — AB (ref 36.0–46.0)
Hemoglobin: 11.8 g/dL — ABNORMAL LOW (ref 12.0–15.0)
Lymphocytes Relative: 27 % (ref 12–46)
Lymphs Abs: 2 10*3/uL (ref 0.7–4.0)
MCH: 31.4 pg (ref 26.0–34.0)
MCHC: 33.9 g/dL (ref 30.0–36.0)
MCV: 92.6 fL (ref 78.0–100.0)
MONO ABS: 1.4 10*3/uL — AB (ref 0.1–1.0)
Monocytes Relative: 18 % — ABNORMAL HIGH (ref 3–12)
NEUTROS PCT: 47 % (ref 43–77)
Neutro Abs: 3.5 10*3/uL (ref 1.7–7.7)
Platelets: 283 10*3/uL (ref 150–400)
RBC: 3.76 MIL/uL — AB (ref 3.87–5.11)
RDW: 12.6 % (ref 11.5–15.5)
WBC: 7.5 10*3/uL (ref 4.0–10.5)

## 2014-03-23 LAB — LIPID PANEL
CHOL/HDL RATIO: 2.8 ratio
CHOLESTEROL: 158 mg/dL (ref 0–200)
HDL: 56 mg/dL (ref >39)
LDL CALC: 85 mg/dL (ref 0–99)
Triglycerides: 84 mg/dL (ref <150)
VLDL: 17 mg/dL (ref 0–40)

## 2014-03-23 LAB — MAGNESIUM: MAGNESIUM: 1.8 mg/dL (ref 1.5–2.5)

## 2014-03-23 LAB — VITAMIN B12: Vitamin B-12: 414 pg/mL (ref 211–911)

## 2014-03-23 LAB — TSH: TSH: 1.61 u[IU]/mL (ref 0.350–4.500)

## 2014-03-23 MED ORDER — PROMETHAZINE HCL 25 MG PO TABS: 25.0000 mg | ORAL_TABLET | Freq: Four times a day (QID) | ORAL | Status: DC | PRN

## 2014-03-23 NOTE — Addendum Note (Signed)
Addended by: Quentin MullingOLLIER, Anapaula Severt R on: 03/23/2014 04:07 PM   Modules accepted: Orders

## 2014-03-23 NOTE — Progress Notes (Signed)
Complete Physical  Assessment and Plan: 1. Chronic atrial fibrillation Continue cardio follow up, no coumadin due to fall risk/age  80. CVD (cerebrovascular disease) Control blood pressure, cholesterol, glucose within reason for age, patient and daughter understand increased risk of CVA  3. AV block, 1st degree monitor  4. Essential hypertension Monitor, will not have strict BP control due to fall risk, explained to patient and daughter.  - CBC with Differential  5. Gastroesophageal reflux disease without esophagitis Diet controlled  6. Hypothyroidism, unspecified hypothyroidism type - TSH  7. Prediabetes Discussed general issues about diabetes pathophysiology and management., Educational material distributed., Suggested low cholesterol diet., Encouraged aerobic exercise., Discussed foot care., Reminded to get yearly retinal exam.  8. Osteoarthritis, unspecified osteoarthritis type, unspecified site controlled  9. Hyperlipidemia - Lipid panel  10. B12 deficiency - Vitamin B12  11. Encounter for long-term (current) use of medications - Magnesium  12. Vitamin D deficiency - Vit D  25 hydroxy (rtn osteoporosis monitoring)  13. Left flank pain/SOB- DOE better after ABX, some diarrhea given probiotic samples, will get CT AB today since ongoing flank pain rule out kidney stone.   Discussed med's effects and SE's. Screening labs and tests as requested with regular follow-up as recommended.  HPI  80 y.o. female  presents for a complete physical.   Her blood pressure has been controlled at home, emphasized we want a higher BP to avoid falls, today their BP is BP: 138/70 mmHg She does not workout. She denies chest pain, shortness of breath, dizziness.  She is not on cholesterol medication and denies myalgias. Her cholesterol is at goal. The cholesterol last visit was:   Lab Results  Component Value Date   CHOL 185 09/27/2013   HDL 58 09/27/2013   LDLCALC 96 09/27/2013   TRIG  155* 09/27/2013   CHOLHDL 3.2 09/27/2013    She has been working on diet and exercise for prediabetes, and denies paresthesia of the feet, polydipsia, polyuria and visual disturbances. Last A1C in the office was:  Lab Results  Component Value Date   HGBA1C 5.6 09/27/2013   Patient is on Vitamin D supplement.   Lab Results  Component Value Date   VD25OH 2 06/29/2013     She has Afib but not on coumadin due to fall risk. She follows with Dr. Patty Sermons.  She is on thyroid medication. Her medication was not changed last visit. Patient denies nervousness, palpitations and weight changes.  Lab Results  Component Value Date   TSH 0.771 09/27/2013  .  Patient was seen 10/22 for flank pain/DOE- CXR showed possible infiltrate LLL, she was put on augmentin but had diarrhea with this, put on zpak instead, her urine showed blood but was without infection, she has continued to have flank pain but it has improved and we are pending a CT AB this afternoon to rule out a kidney stone.  Last fall was Oct 3rd, no fracture, turned too quickly and fell, went to ER Current Medications:  Current Outpatient Prescriptions on File Prior to Visit  Medication Sig Dispense Refill  . aspirin 81 MG tablet Take 81 mg by mouth at bedtime.       Marland Kitchen azithromycin (ZITHROMAX) 250 MG tablet Take 2 tablets (500 mg) on  Day 1,  followed by 1 tablet (250 mg) once daily on Days 2 through 5.  6 each  1  . Cholecalciferol (VITAMIN D3) 1000 UNITS CAPS Take 1,000 Units by mouth 2 (two) times daily.       Marland Kitchen  Flaxseed, Linseed, (FLAX SEED OIL PO) Take 1 tablet by mouth 2 (two) times daily.       Marland Kitchen levothyroxine (SYNTHROID, LEVOTHROID) 75 MCG tablet Take 1 tablet (75 mcg total) by mouth daily before breakfast.  90 tablet  1  . lisinopril (PRINIVIL,ZESTRIL) 5 MG tablet Take 0.5 tablets (2.5 mg total) by mouth daily.  30 tablet  6  . MAGNESIUM OXIDE PO Take 250 mg by mouth daily.      . Multiple Vitamins-Minerals (OCUVITE PRESERVISION PO) Take 1  tablet by mouth 2 (two) times daily.       . promethazine (PHENERGAN) 25 MG tablet TAKE 1 TABLET BY MOUTH 3-4 TIMES A DAY AS NEEDED FOR NAUSEA  30 tablet  0  . traMADol (ULTRAM) 50 MG tablet TAKE 1 TABLET BY MOUTH 3-4 TIMES A DAY AS NEEDED FOR PAIN  90 tablet  0  . triamcinolone (KENALOG) 0.025 % cream Apply 1 application topically as needed (for rash).        No current facility-administered medications on file prior to visit.   Health Maintenance:   Immunization History  Administered Date(s) Administered  . Influenza Split 02/03/2013  . Influenza, High Dose Seasonal PF 02/22/2014  . Pneumococcal-Unspecified 05/26/2009  . Td 03/23/2013  . Zoster 05/26/2005   Last colonoscopy: 2009 will not have another  Last mammogram: declines  Last pap smear/pelvic exam: declines  DEXA:declines  Prior vaccinations:  TD or Tdap: 2014  Influenza: 2015  Pneumococcal: 2011  Prevnar: DUE Shingles/Zostavax: 2007  Names of Other Physician/Practitioners you currently use:  1. [facility]- here for primary care  2. Dr. Hyacinth Meeker, eye doctor, last visit 09/2012  3. Dr. Matthias Hughs  4. Dr. Carney Harder but sees Rosalio Macadamia, NP  Allergies:  Allergies  Allergen Reactions  . Augmentin [Amoxicillin-Pot Clavulanate] Diarrhea  . Ciprocin-Fluocin-Procin [Fluocinolone Acetonide] Nausea And Vomiting  . Ciprofloxacin Hcl Nausea And Vomiting  . Codeine Hives and Nausea And Vomiting  . Sulfa Drugs Cross Reactors Hives and Nausea And Vomiting   Medical History:  Past Medical History  Diagnosis Date  . Syncope and collapse     Negative loop recorder in the past  . Hypertension   . AV block, 1st degree   . CVD (cerebrovascular disease)     History of CVA  . Hypokalemia   . Diarrhea   . Atrial fibrillation     not a candidate for coumadin due to falls  . Pneumonia 09/11  . Traumatic enucleation of eye     left eye  . History of echocardiogram 9/11    normal EF, mild to  mod MR & TR, mod pulmonary HTN  . Varicose veins   . Advanced age   . Prediabetes   . Hypothyroidism   . GERD (gastroesophageal reflux disease)   . Osteoarthritis    Surgical History:  Past Surgical History  Procedure Laterality Date  . Back surgery    . Inguinal hernia repair      left sided   Family History:  Family History  Problem Relation Age of Onset  . Heart attack Father   . Heart attack Brother   . Cancer Brother   . Stroke Brother    Social History:  History  Substance Use Topics  . Smoking status: Never Smoker   . Smokeless tobacco: Not on file  . Alcohol Use: No     Review of Systems: [X]  = complains of  [ ]  = denies  General: Fatigue [ ]  Fever [ ]  Chills [ ]  Weakness [ ]   Insomnia [ ] Weight change [ ]  Night sweats [ ]   Change in appetite [ ]  Head: Head Trauma [ ]  Eyes: Wears glasses or corrective lens [ ]  Redness [ ]  Blurred vision [ ]  Diplopia [ ]  Discharge [ ]  Floaters [ ]  YNW:GNFAOZHENT:Earache [ ]  hearing loss [ ]  Tinnitus [ ]  Ear Discharge [ ]   Congestion [ ]  Sinus Pain [ ]  Post Nasal Drip [ ]  Nose Bleeds [ ]  Rhinorrhea [ ]    Difficulty Swallowing [ ]  Snoring [ ]  Sore Throat [ ]  Cardiac:   Chest pain/pressure [ ]  SOB chronic [x ] Orthopnea [ ]   Palpitations [ ]   Paroxysmal nocturnal dyspnea[ ]  Claudication [ ]  Edema [x ] Difficulty walking around block or climbing stairs [ ]  Pulmonary: Cough [ ]  Wheezing[ ]   SOB [ ]   Pleurisy [ ]  Asthma [ ]  GI: Nausea [ ]  Vomiting[ ]  Dysphagia[ ]  Heartburn[ ]  Abdominal pain [ ]  Constipation [ ] ; Diarrhea [ ]  BRBPR [ ]  Melena[ ]  Bloating [ ]  Hemorrhoids [ ]  Incontinence [ ]  GU: Hematuria[ ]  Dysuria [ ]  Nocturia[ ]  Urgency [ ]   Hesitancy [ ]  Discharge [ ]  Frequency [ ]  Incontinence [ ]  Breast:  Dimpling [ ]  Breast lumps [ ]   Breast Lesions [ ]  Nipple discharge [ ]    Neuro: Headaches[ ]  Vertigo[ ]  Paresthesias[ ]  Spasm [ ]  Speech changes [ ]  Incoordination [ ]  Dizziness [ ]  Numbness [ ]  Ortho: Arthritis [ ]  Joint pain [ ]  Muscle pain  [ ]  Joint swelling [ ]  Back Pain/left flank [x]  Weakness [ ]  Stiffness [ ]  Skin:  Rash [ ]   Pruritis [ ]  Change in skin lesion [ ]  Change in hair [ ]  Change in nails [ ]  Psych: Depression[ ]  Anxiety[ ]  Stress [ ]  Confusion [ ]  Memory loss [ ]   Heme/Lymph: Bleeding [ ]  Bruising [ ]  History of anemia [ ]  Enlarged lymph nodes [ ]   Endocrine: Visual blurring [ ]  Paresthesia [ ]  Polyuria [ ]  Polydipsia [ ]  Polyphagia [ ]  Heat/cold intolerance [ ]  Hypoglycemia [ ]  Thyroid Issues [ ]  Diabetes [ ]   Physical Exam: Estimated body mass index is 19.93 kg/(m^2) as calculated from the following:   Height as of this encounter: 5\' 2"  (1.575 m).   Weight as of this encounter: 109 lb (49.442 kg). BP 138/70  Pulse 84  Temp(Src) 98.2 F (36.8 C)  Resp 16  Ht 5\' 2"  (1.575 m)  Wt 109 lb (49.442 kg)  BMI 19.93 kg/m2 General Appearance: Decreased weight in no apparent distress.  Eyes: PERRLA, EOMs, conjunctiva no swelling or erythema, normal fundi and vessels.  Sinuses: No Frontal/maxillary tenderness  ENT/Mouth: Ext aud canals clear, normal light reflex with TMs without erythema, bulging. Good dentition. No erythema, swelling, or exudate on post pharynx. Tonsils not swollen or erythematous. Hearing decreased  Neck: Supple, thyroid normal. No bruits  Respiratory: Respiratory effort normal, BS equal bilaterally without rales, rhonchi, wheezing or stridor.  Cardio: irreg irreg with 2/6 mumur, rubs or gallops. Brisk peripheral pulses without edema.  Chest: symmetric, with normal excursions and percussion.  Breasts: defer Abdomen: Soft, nontender, no guarding, rebound, hernias, masses, or organomegaly. .  Lymphatics: Non tender without lymphadenopathy.  Genitourinary: defer Musculoskeletal: Full ROM all peripheral extremities,4/5 strength, and gait unsteady, wide based Skin: Warm, dry without rashes, lesions, ecchymosis. Lipoma left flank.  Neuro: Cranial nerves intact, reflexes equal bilaterally.  Normal muscle  tone, no cerebellar symptoms.  Psych: Awake and oriented X 3, normal affect, Insight and Judgment appropriate.   EKG: defer   Quentin MullingCollier, Denym Christenberry 10:26 AM [facility]

## 2014-03-23 NOTE — Patient Instructions (Signed)

## 2014-03-24 LAB — URINALYSIS, ROUTINE W REFLEX MICROSCOPIC
BILIRUBIN URINE: NEGATIVE
Glucose, UA: NEGATIVE mg/dL
Hgb urine dipstick: NEGATIVE
KETONES UR: NEGATIVE mg/dL
Leukocytes, UA: NEGATIVE
Nitrite: NEGATIVE
PROTEIN: NEGATIVE mg/dL
Specific Gravity, Urine: 1.007 (ref 1.005–1.030)
Urobilinogen, UA: 0.2 mg/dL (ref 0.0–1.0)
pH: 6 (ref 5.0–8.0)

## 2014-03-24 LAB — VITAMIN D 25 HYDROXY (VIT D DEFICIENCY, FRACTURES): VIT D 25 HYDROXY: 66 ng/mL (ref 30–89)

## 2014-03-24 LAB — MICROALBUMIN / CREATININE URINE RATIO
CREATININE, URINE: 34.7 mg/dL
Microalb Creat Ratio: 11.5 mg/g (ref 0.0–30.0)
Microalb, Ur: 0.4 mg/dL (ref <2.0)

## 2014-03-25 LAB — URINE CULTURE: Colony Count: 5000

## 2014-04-11 ENCOUNTER — Ambulatory Visit (INDEPENDENT_AMBULATORY_CARE_PROVIDER_SITE_OTHER): Payer: Medicare Other | Admitting: *Deleted

## 2014-04-11 ENCOUNTER — Ambulatory Visit: Payer: Self-pay

## 2014-04-11 DIAGNOSIS — Z23 Encounter for immunization: Secondary | ICD-10-CM

## 2014-06-19 ENCOUNTER — Other Ambulatory Visit: Payer: Self-pay | Admitting: Internal Medicine

## 2014-06-22 DIAGNOSIS — Z961 Presence of intraocular lens: Secondary | ICD-10-CM | POA: Diagnosis not present

## 2014-06-28 ENCOUNTER — Emergency Department (HOSPITAL_COMMUNITY): Payer: Medicare Other

## 2014-06-28 ENCOUNTER — Encounter (HOSPITAL_COMMUNITY): Payer: Self-pay | Admitting: Emergency Medicine

## 2014-06-28 ENCOUNTER — Emergency Department (HOSPITAL_COMMUNITY)
Admission: EM | Admit: 2014-06-28 | Discharge: 2014-06-28 | Disposition: A | Discharge status: Home / Self Care | Payer: Medicare Other | Attending: Emergency Medicine | Admitting: Emergency Medicine

## 2014-06-28 DIAGNOSIS — E039 Hypothyroidism, unspecified: Secondary | ICD-10-CM | POA: Diagnosis not present

## 2014-06-28 DIAGNOSIS — R112 Nausea with vomiting, unspecified: Secondary | ICD-10-CM | POA: Diagnosis not present

## 2014-06-28 DIAGNOSIS — Z7982 Long term (current) use of aspirin: Secondary | ICD-10-CM | POA: Diagnosis not present

## 2014-06-28 DIAGNOSIS — M1990 Unspecified osteoarthritis, unspecified site: Secondary | ICD-10-CM | POA: Insufficient documentation

## 2014-06-28 DIAGNOSIS — I4891 Unspecified atrial fibrillation: Secondary | ICD-10-CM | POA: Diagnosis not present

## 2014-06-28 DIAGNOSIS — R51 Headache: Secondary | ICD-10-CM | POA: Diagnosis not present

## 2014-06-28 DIAGNOSIS — R519 Headache, unspecified: Secondary | ICD-10-CM

## 2014-06-28 DIAGNOSIS — Z8719 Personal history of other diseases of the digestive system: Secondary | ICD-10-CM | POA: Insufficient documentation

## 2014-06-28 DIAGNOSIS — Z79899 Other long term (current) drug therapy: Secondary | ICD-10-CM | POA: Insufficient documentation

## 2014-06-28 DIAGNOSIS — I10 Essential (primary) hypertension: Secondary | ICD-10-CM | POA: Diagnosis not present

## 2014-06-28 DIAGNOSIS — Z87828 Personal history of other (healed) physical injury and trauma: Secondary | ICD-10-CM | POA: Diagnosis not present

## 2014-06-28 DIAGNOSIS — Z8673 Personal history of transient ischemic attack (TIA), and cerebral infarction without residual deficits: Secondary | ICD-10-CM | POA: Diagnosis not present

## 2014-06-28 DIAGNOSIS — Z8701 Personal history of pneumonia (recurrent): Secondary | ICD-10-CM | POA: Insufficient documentation

## 2014-06-28 LAB — BASIC METABOLIC PANEL
Anion gap: 9 (ref 5–15)
BUN: 19 mg/dL (ref 6–23)
CO2: 25 mmol/L (ref 19–32)
CREATININE: 1.09 mg/dL (ref 0.50–1.10)
Calcium: 9.6 mg/dL (ref 8.4–10.5)
Chloride: 103 mmol/L (ref 96–112)
GFR calc non Af Amer: 41 mL/min — ABNORMAL LOW (ref >90)
GFR, EST AFRICAN AMERICAN: 48 mL/min — AB (ref >90)
Glucose, Bld: 101 mg/dL — ABNORMAL HIGH (ref 70–99)
POTASSIUM: 4.7 mmol/L (ref 3.5–5.1)
Sodium: 137 mmol/L (ref 135–145)

## 2014-06-28 LAB — CBC WITH DIFFERENTIAL/PLATELET
BASOS ABS: 0.1 10*3/uL (ref 0.0–0.1)
Basophils Relative: 1 % (ref 0–1)
EOS PCT: 9 % — AB (ref 0–5)
Eosinophils Absolute: 0.7 10*3/uL (ref 0.0–0.7)
HCT: 33.7 % — ABNORMAL LOW (ref 36.0–46.0)
HEMOGLOBIN: 11.3 g/dL — AB (ref 12.0–15.0)
Lymphocytes Relative: 25 % (ref 12–46)
Lymphs Abs: 2 10*3/uL (ref 0.7–4.0)
MCH: 30.8 pg (ref 26.0–34.0)
MCHC: 33.5 g/dL (ref 30.0–36.0)
MCV: 91.8 fL (ref 78.0–100.0)
MONO ABS: 1.2 10*3/uL — AB (ref 0.1–1.0)
MONOS PCT: 15 % — AB (ref 3–12)
NEUTROS ABS: 3.9 10*3/uL (ref 1.7–7.7)
NEUTROS PCT: 50 % (ref 43–77)
PLATELETS: 238 10*3/uL (ref 150–400)
RBC: 3.67 MIL/uL — AB (ref 3.87–5.11)
RDW: 13.2 % (ref 11.5–15.5)
WBC: 7.8 10*3/uL (ref 4.0–10.5)

## 2014-06-28 MED ORDER — ONDANSETRON HCL 4 MG PO TABS: 4.0000 mg | ORAL_TABLET | Freq: Four times a day (QID) | ORAL | Status: AC

## 2014-06-28 MED ORDER — SODIUM CHLORIDE 0.9 % IV BOLUS (SEPSIS)
500.0000 mL | Freq: Once | INTRAVENOUS | Status: AC
  Administered 2014-06-28: 500 mL via INTRAVENOUS

## 2014-06-28 MED ORDER — TRAMADOL HCL 50 MG PO TABS
50.0000 mg | ORAL_TABLET | Freq: Once | ORAL | Status: AC
  Administered 2014-06-28: 50 mg via ORAL
  Filled 2014-06-28: qty 1

## 2014-06-28 MED ORDER — METOCLOPRAMIDE HCL 5 MG/ML IJ SOLN
5.0000 mg | Freq: Once | INTRAMUSCULAR | Status: AC
  Administered 2014-06-28: 5 mg via INTRAVENOUS
  Filled 2014-06-28: qty 2

## 2014-06-28 MED ORDER — DIPHENHYDRAMINE HCL 50 MG/ML IJ SOLN
12.5000 mg | Freq: Once | INTRAMUSCULAR | Status: AC
  Administered 2014-06-28: 12.5 mg via INTRAVENOUS
  Filled 2014-06-28: qty 1

## 2014-06-28 NOTE — ED Notes (Signed)
Pt c/o headache for 10 days, no nausea or vomiting.  Has taken medications without relief.

## 2014-06-28 NOTE — ED Provider Notes (Signed)
CSN: [id number]     Arrival date & time 06/28/14  1540 History   First MD Initiated Contact with Patient 06/28/14 1750     Chief Complaint  Patient presents with  . Headache     Patient is a 81 y.o. female presenting with headaches. The history is provided by the patient and a relative. No language interpreter was used.  Headache  Ms. Barb presents for evaluation of headache. She reports left-sided headache that started about 10 days ago. The pain is described as dull in nature. It was gradual onset and mild when it began. It is more intense the next morning. The headache tends to be worse in the mornings and right at bedtime. Symptoms are waxing and waning. She has tried Tylenol and Aleve at home without relief. She denies any fevers, vomiting, chest pain, new numbness or weakness. She has a history of head trauma with enucleation of the left eye 60+ years ago. She does not have a history of frequent headaches.  Past Medical History  Diagnosis Date  . Syncope and collapse     Negative loop recorder in the past  . Hypertension   . AV block, 1st degree   . CVD (cerebrovascular disease)     History of CVA  . Hypokalemia   . Diarrhea   . Atrial fibrillation     not a candidate for coumadin due to falls  . Pneumonia 09/11  . Traumatic enucleation of eye     left eye  . History of echocardiogram 9/11    normal EF, mild to mod MR & TR, mod pulmonary HTN  . Varicose veins   . Advanced age   . Prediabetes   . Hypothyroidism   . GERD (gastroesophageal reflux disease)   . Osteoarthritis    Past Surgical History  Procedure Laterality Date  . Back surgery    . Inguinal hernia repair      left sided   Family History  Problem Relation Age of Onset  . Heart attack Father   . Heart attack Brother   . Cancer Brother   . Stroke Brother    History  Substance Use Topics  . Smoking status: Never Smoker   . Smokeless tobacco: Not on file  . Alcohol Use: No   OB History    No data  available     Review of Systems  Neurological: Positive for headaches.  All other systems reviewed and are negative.     Allergies  Augmentin; Ciprocin-fluocin-procin; Ciprofloxacin hcl; Codeine; and Sulfa drugs cross reactors  Home Medications   Prior to Admission medications   Medication Sig Start Date End Date Taking? Authorizing Provider  aspirin 81 MG tablet Take 81 mg by mouth at bedtime.     Historical Provider, MD  Cholecalciferol (VITAMIN D3) 1000 UNITS CAPS Take 1,000 Units by mouth 2 (two) times daily.     Historical Provider, MD  Flaxseed, Linseed, (FLAX SEED OIL PO) Take 1 tablet by mouth 2 (two) times daily.     Historical Provider, MD  lisinopril (PRINIVIL,ZESTRIL) 5 MG tablet Take 0.5 tablets (2.5 mg total) by mouth daily. 12/26/13   Vesta Mixer, MD  MAGNESIUM OXIDE PO Take 250 mg by mouth daily.    Historical Provider, MD  Multiple Vitamins-Minerals (OCUVITE PRESERVISION PO) Take 1 tablet by mouth 2 (two) times daily.     Historical Provider, MD  promethazine (PHENERGAN) 25 MG tablet Take 1 tablet (25 mg total) by mouth every 6 (  six) hours as needed for nausea or vomiting. 03/23/14   Quentin MullingAmanda Collier, PA-C  SYNTHROID 75 MCG tablet TAKE 1 TABLET (75 MCG TOTAL) BY MOUTH DAILY BEFORE BREAKFAST. 06/19/14   Quentin MullingAmanda Collier, PA-C  traMADol (ULTRAM) 50 MG tablet TAKE 1 TABLET BY MOUTH 3-4 TIMES A DAY AS NEEDED FOR PAIN 03/14/14   Quentin MullingAmanda Collier, PA-C  triamcinolone (KENALOG) 0.025 % cream Apply 1 application topically as needed (for rash).     Historical Provider, MD   BP 152/74 mmHg  Pulse 81  Temp(Src) 98 F (36.7 C) (Oral)  Resp 22  Ht 5\' 2"  (1.575 m)  Wt 107 lb (48.535 kg)  BMI 19.57 kg/m2  SpO2 99% Physical Exam  Constitutional: She is oriented to person, place, and time. She appears well-developed and well-nourished.  HENT:  Head: Normocephalic and atraumatic.  Eyes:  Right pupils small and reactive to light. Left eyes artificial.  Cardiovascular: Normal rate  and regular rhythm.   No murmur heard. Pulmonary/Chest: Effort normal and breath sounds normal. No respiratory distress.  Abdominal: Soft. There is no tenderness. There is no rebound and no guarding.  Musculoskeletal: She exhibits no edema or tenderness.  Neurological: She is alert and oriented to person, place, and time.  There is no asymmetry of facial muscles. 5 out of 5 strength in all 4 extremities. Sensation to light touch intact in all 4 extremities.  Skin: Skin is warm and dry.  Psychiatric: She has a normal mood and affect. Her behavior is normal.  Nursing note and vitals reviewed.   ED Course  Procedures (including critical care time) Labs Review Labs Reviewed  BASIC METABOLIC PANEL - Abnormal; Notable for the following:    Glucose, Bld 101 (*)    GFR calc non Af Amer 41 (*)    GFR calc Af Amer 48 (*)    All other components within normal limits  CBC WITH DIFFERENTIAL/PLATELET - Abnormal; Notable for the following:    RBC 3.67 (*)    Hemoglobin 11.3 (*)    HCT 33.7 (*)    Monocytes Relative 15 (*)    Monocytes Absolute 1.2 (*)    Eosinophils Relative 9 (*)    All other components within normal limits    Imaging Review Ct Head Wo Contrast  06/28/2014   CLINICAL DATA:  81 year old female with history of headache for the past 10 days. No associated nausea or vomiting.  EXAM: CT HEAD WITHOUT CONTRAST  TECHNIQUE: Contiguous axial images were obtained from the base of the skull through the vertex without intravenous contrast.  COMPARISON:  Head CT 11/09/2013.  FINDINGS: Physiologic calcifications in the basal ganglia and cerebellar peduncle is bilaterally. Mild cerebral and cerebellar atrophy, within normal limits for the patient's age. Patchy and confluent areas of decreased attenuation are noted throughout the deep and periventricular white matter of the cerebral hemispheres bilaterally, compatible with chronic microvascular ischemic disease (also within normal limits for the  patient's age). No acute intracranial abnormalities. Specifically, no evidence of acute intracranial hemorrhage, no definite findings of acute/subacute cerebral ischemia, no mass, mass effect, hydrocephalus or abnormal intra or extra-axial fluid collections. Visualized paranasal sinuses and mastoids are well pneumatized. No acute displaced skull fractures are identified. 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  IMPRESSION: 1. No acute intracranial abnormalities to explain the patient's symptoms. 2. Mild cerebral and cerebellar atrophy with chronic microvascular ischemic changes in the cerebral white matter, similar to the prior examination, and within normal limits for the patient's age.  Electronically Signed   By: Trudie Reed M.D.   On: 06/28/2014 20:19     EKG Interpretation None      MDM   Final diagnoses:  Nonintractable headache, unspecified chronicity pattern, unspecified headache type   Patient here for evaluation of headache. History and presentation is not consistent with subarachnoid hemorrhage, meningitis, temporal arteritis, hypertensive urgency. Patient's headache is waxing and waning in nature, it initially improved with headache cocktail and then returned and then improved again with tramadol. Discussed the patient taking her tramadol at home, writing Zofran for associated nausea that she has at times. Discussed PCP follow-up as well as return precautions for headache.    Tilden Fossa, MD 06/28/14 2340

## 2014-06-28 NOTE — Discharge Instructions (Signed)

## 2014-06-29 ENCOUNTER — Ambulatory Visit: Payer: Self-pay | Admitting: Internal Medicine

## 2014-07-24 ENCOUNTER — Ambulatory Visit (INDEPENDENT_AMBULATORY_CARE_PROVIDER_SITE_OTHER): Payer: Medicare Other | Admitting: Internal Medicine

## 2014-07-24 ENCOUNTER — Encounter: Payer: Self-pay | Admitting: Internal Medicine

## 2014-07-24 VITALS — BP 160/74 | HR 86 | Temp 98.0°F | Resp 16 | Ht 62.0 in | Wt 112.0 lb

## 2014-07-24 DIAGNOSIS — I10 Essential (primary) hypertension: Secondary | ICD-10-CM | POA: Diagnosis not present

## 2014-07-24 DIAGNOSIS — R609 Edema, unspecified: Secondary | ICD-10-CM

## 2014-07-24 DIAGNOSIS — I482 Chronic atrial fibrillation, unspecified: Secondary | ICD-10-CM

## 2014-07-24 MED ORDER — HYDROCHLOROTHIAZIDE 25 MG PO TABS
25.0000 mg | ORAL_TABLET | Freq: Every day | ORAL | Status: DC
Start: 2014-07-24 — End: 2015-05-09

## 2014-07-24 NOTE — Progress Notes (Signed)
Subjective:    Patient ID: [name], female    DOB: 08/27/1914, 81 y.o.   MRN: [id number]  HPI A very nice elderly 81 yo WWF with multiple co-morbidies incl HTN, ASHD/aFib (not on anti-coag due to fall risk) and she is brought by daughter for evaluation of lower ext edema over the weekend which has somewhat improved. Patient appears comfortable and and cardiac systems review is negative  Outpatient Prescriptions Prior to Visit  Medication Sig  . acetaminophen (TYLENOL) 325 MG tablet Take 325 mg by mouth every 6 (six) hours as needed for headache.  Marland Kitchen. aspirin 81 MG tablet Take 81 mg by mouth at bedtime.   . beta carotene w/minerals (OCUVITE) tablet Take 1 tablet by mouth 2 (two) times daily.  . Cholecalciferol (VITAMIN D3) 1000 UNITS CAPS Take 1,000 Units by mouth 2 (two) times daily.   . Flaxseed, Linseed, (FLAX SEED OIL PO) Take 1 tablet by mouth 2 (two) times daily.   Marland Kitchen. lisinopril (PRINIVIL,ZESTRIL) 5 MG tablet Take 0.5 tablets (2.5 mg total) by mouth daily.  Marland Kitchen. MAGNESIUM PO Take 1 tablet by mouth daily.  Marland Kitchen. SYNTHROID 75 MCG tablet TAKE 1 TABLET (75 MCG TOTAL) BY MOUTH DAILY BEFORE BREAKFAST.  Marland Kitchen. triamcinolone (KENALOG) 0.025 % cream Apply 1 application topically as needed (for rash).   . ondansetron (ZOFRAN) 4 MG tablet Take 1 tablet (4 mg total) by mouth every 6 (six) hours. (Patient not taking: Reported on 07/24/2014)  . promethazine (PHENERGAN) 25 MG tablet Take 1 tablet (25 mg total) by mouth every 6 (six) hours as needed for nausea or vomiting. (Patient not taking: Reported on 07/24/2014)  . traMADol (ULTRAM) 50 MG tablet TAKE 1 TABLET BY MOUTH 3-4 TIMES A DAY AS NEEDED FOR PAIN (Patient not taking: Reported on 07/24/2014)   Allergies  Allergen Reactions  . Augmentin [Amoxicillin-Pot Clavulanate] Diarrhea  . Ciprocin-Fluocin-Procin [Fluocinolone Acetonide] Nausea And Vomiting  . Ciprofloxacin Hcl Nausea And Vomiting  . Codeine Hives and Nausea And Vomiting  . Sulfa Drugs Cross  Reactors Hives and Nausea And Vomiting   Past Medical History  Diagnosis Date  . Syncope and collapse     Negative loop recorder in the past  . Hypertension   . AV block, 1st degree   . CVD (cerebrovascular disease)     History of CVA  . Hypokalemia   . Diarrhea   . Atrial fibrillation     not a candidate for coumadin due to falls  . Pneumonia 09/11  . Traumatic enucleation of eye     left eye  . History of echocardiogram 9/11    normal EF, mild to mod MR & TR, mod pulmonary HTN  . Varicose veins   . Advanced age   . Prediabetes   . Hypothyroidism   . GERD (gastroesophageal reflux disease)   . Osteoarthritis    Past Surgical History  Procedure Laterality Date  . Back surgery    . Inguinal hernia repair      left sided   Review of Systems  10 pt sys review negative .    Objective:   Physical Exam   BP 160/74   Pulse 86  Temp 98 F   Resp 16  Ht 5\' 2"    Wt 112 lb     BMI 20.48   Skin appears very thin with stasis dermatitis and hemosideration pigment  HEENT - Eac's patent. TM's Nl. EOM's full. PERRLA. NasoOroPharynx clear. Neck - supple. Nl Thyroid. Carotids 2+ & No bruits, nodes, JVD Chest - Clear equal BS w/o Rales, rhonchi, wheezes. Cor - Nl HS. RRR w/o sig MGR. PP 1(+). 1(+) pretibial / ankle edema. Abd - No palpable organomegaly, masses or tenderness. BS nl. MS- FROM w/o deformities. Muscle power, tone and bulk Nl. Gait Nl. Neuro - No obvious Cr N abnormalities. Sensory, motor and Cerebellar functions appear Nl w/o focal abnormalities. Psyche - Mental status normal & appropriate.  No delusions, ideations or obvious mood abnormalities.    Assessment & Plan:   1. Essential hypertension   2. Chronic atrial fibrillation   3. Edema  - hydrochlorothiazide (HYDRODIURIL) 25 MG tablet; Take 1 tablet (25 mg total) by mouth daily.  Dispense: 90 tablet; Refill: 1  - Discussed meds/SE's - ROV 1 Mo or prn

## 2014-07-26 ENCOUNTER — Ambulatory Visit: Payer: Self-pay | Admitting: Internal Medicine

## 2014-08-30 ENCOUNTER — Encounter: Payer: Self-pay | Admitting: Internal Medicine

## 2014-08-30 ENCOUNTER — Ambulatory Visit (INDEPENDENT_AMBULATORY_CARE_PROVIDER_SITE_OTHER): Payer: Medicare Other | Admitting: Internal Medicine

## 2014-08-30 VITALS — BP 154/68 | HR 84 | Temp 97.7°F | Resp 16 | Ht 62.0 in | Wt 109.8 lb

## 2014-08-30 DIAGNOSIS — I10 Essential (primary) hypertension: Secondary | ICD-10-CM | POA: Diagnosis not present

## 2014-08-30 DIAGNOSIS — Z79899 Other long term (current) drug therapy: Secondary | ICD-10-CM | POA: Diagnosis not present

## 2014-08-30 DIAGNOSIS — R7309 Other abnormal glucose: Secondary | ICD-10-CM | POA: Diagnosis not present

## 2014-08-30 DIAGNOSIS — Z9181 History of falling: Secondary | ICD-10-CM

## 2014-08-30 DIAGNOSIS — I482 Chronic atrial fibrillation, unspecified: Secondary | ICD-10-CM

## 2014-08-30 DIAGNOSIS — Z0000 Encounter for general adult medical examination without abnormal findings: Secondary | ICD-10-CM

## 2014-08-30 DIAGNOSIS — R6889 Other general symptoms and signs: Secondary | ICD-10-CM | POA: Diagnosis not present

## 2014-08-30 DIAGNOSIS — E559 Vitamin D deficiency, unspecified: Secondary | ICD-10-CM | POA: Insufficient documentation

## 2014-08-30 DIAGNOSIS — E782 Mixed hyperlipidemia: Secondary | ICD-10-CM | POA: Diagnosis not present

## 2014-08-30 DIAGNOSIS — E039 Hypothyroidism, unspecified: Secondary | ICD-10-CM

## 2014-08-30 DIAGNOSIS — Z0001 Encounter for general adult medical examination with abnormal findings: Secondary | ICD-10-CM | POA: Diagnosis not present

## 2014-08-30 DIAGNOSIS — R7303 Prediabetes: Secondary | ICD-10-CM

## 2014-08-30 DIAGNOSIS — I679 Cerebrovascular disease, unspecified: Secondary | ICD-10-CM

## 2014-08-30 DIAGNOSIS — Z1331 Encounter for screening for depression: Secondary | ICD-10-CM

## 2014-08-30 NOTE — Patient Instructions (Signed)

## 2014-08-31 ENCOUNTER — Encounter: Payer: Self-pay | Admitting: Internal Medicine

## 2014-08-31 LAB — CBC WITH DIFFERENTIAL/PLATELET
Basophils Absolute: 0.1 10*3/uL (ref 0.0–0.1)
Basophils Relative: 1 % (ref 0–1)
EOS PCT: 7 % — AB (ref 0–5)
Eosinophils Absolute: 0.5 10*3/uL (ref 0.0–0.7)
HCT: 39.9 % (ref 36.0–46.0)
Hemoglobin: 13.2 g/dL (ref 12.0–15.0)
LYMPHS PCT: 28 % (ref 12–46)
Lymphs Abs: 2.2 10*3/uL (ref 0.7–4.0)
MCH: 30.8 pg (ref 26.0–34.0)
MCHC: 33.1 g/dL (ref 30.0–36.0)
MCV: 93 fL (ref 78.0–100.0)
MONO ABS: 1 10*3/uL (ref 0.1–1.0)
MPV: 10 fL (ref 8.6–12.4)
Monocytes Relative: 13 % — ABNORMAL HIGH (ref 3–12)
Neutro Abs: 3.9 10*3/uL (ref 1.7–7.7)
Neutrophils Relative %: 51 % (ref 43–77)
PLATELETS: 297 10*3/uL (ref 150–400)
RBC: 4.29 MIL/uL (ref 3.87–5.11)
RDW: 13.2 % (ref 11.5–15.5)
WBC: 7.7 10*3/uL (ref 4.0–10.5)

## 2014-08-31 LAB — HEPATIC FUNCTION PANEL
ALK PHOS: 52 U/L (ref 39–117)
ALT: 24 U/L (ref 0–35)
AST: 30 U/L (ref 0–37)
Albumin: 4.4 g/dL (ref 3.5–5.2)
Bilirubin, Direct: 0.1 mg/dL (ref 0.0–0.3)
Indirect Bilirubin: 0.5 mg/dL (ref 0.2–1.2)
TOTAL PROTEIN: 7 g/dL (ref 6.0–8.3)
Total Bilirubin: 0.6 mg/dL (ref 0.2–1.2)

## 2014-08-31 LAB — HEMOGLOBIN A1C
Hgb A1c MFr Bld: 5.9 % — ABNORMAL HIGH (ref <5.7)
Mean Plasma Glucose: 123 mg/dL — ABNORMAL HIGH (ref <117)

## 2014-08-31 LAB — BASIC METABOLIC PANEL WITH GFR
BUN: 26 mg/dL — AB (ref 6–23)
CALCIUM: 9.8 mg/dL (ref 8.4–10.5)
CHLORIDE: 102 meq/L (ref 96–112)
CO2: 25 meq/L (ref 19–32)
CREATININE: 0.98 mg/dL (ref 0.50–1.10)
GFR, Est African American: 56 mL/min — ABNORMAL LOW
GFR, Est Non African American: 49 mL/min — ABNORMAL LOW
Glucose, Bld: 91 mg/dL (ref 70–99)
Potassium: 4.8 mEq/L (ref 3.5–5.3)
Sodium: 138 mEq/L (ref 135–145)

## 2014-08-31 LAB — MAGNESIUM: MAGNESIUM: 1.9 mg/dL (ref 1.5–2.5)

## 2014-08-31 LAB — INSULIN, RANDOM: INSULIN: 6.2 u[IU]/mL (ref 2.0–19.6)

## 2014-08-31 LAB — VITAMIN D 25 HYDROXY (VIT D DEFICIENCY, FRACTURES): VIT D 25 HYDROXY: 48 ng/mL (ref 30–100)

## 2014-08-31 LAB — TSH: TSH: 0.937 u[IU]/mL (ref 0.350–4.500)

## 2014-08-31 NOTE — Progress Notes (Addendum)
Patient ID: [name], female   DOB: 01/20/1915, 81 y.o.   MRN: [id number]  MEDICARE ANNUAL WELLNESS VISIT AND OV  Assessment:   1. Essential hypertension  - TSH  2. Hyperlipidemia   3. Prediabetes  - Hemoglobin A1c - Insulin, random  4. Vitamin D deficiency  - Vit D  25 hydroxy (rtn osteoporosis monitoring)  5. Hypothyroidism, unspecified hypothyroidism type   6. Medication management  - CBC with Differential/Platelet - BASIC METABOLIC PANEL WITH GFR - Hepatic function panel - Magnesium  7. Chronic atrial fibrillation   8. CVD (cerebrovascular disease)   9. Routine general medical examination at a health care facility   10. Depression screen   11. At low risk for fall  12. At moderate risk for fall  Plan:   During the course of the visit the patient and family (son and daughter-in-law) were educated and counseled about appropriate screening and preventive services including:    Pneumococcal vaccine   Influenza vaccine  Td vaccine  Screening electrocardiogram  Bone densitometry screening  Colorectal cancer screening  Diabetes screening  Glaucoma screening  Nutrition counseling   Advanced directives: requested  Screening recommendations, referrals: Vaccinations:  Immunization History  Administered Date(s) Administered  . Influenza Split 02/03/2013  . Influenza, High Dose Seasonal PF 02/22/2014  . Pneumococcal Conjugate-13 04/11/2014  . Pneumococcal-Unspecified 05/26/2009  . Td 03/23/2013  . Zoster 05/26/2005  Hep B vaccine not indicated  Nutrition assessed and recommended  Colonoscopy 2009 Recommended yearly ophthalmology/optometry visit for glaucoma screening and checkup Recommended yearly dental visit for hygiene and checkup Advanced directives - yes  Conditions/risks identified: BMI: Discussed weight loss, diet, and increase physical activity.  Increase physical activity: AHA recommends 150 minutes of physical activity  a week.  Medications reviewed preDiabetes is not at goal, ACE/ARB therapy: Yes. Urinary Incontinence is not an issue: discussed non pharmacology and pharmacology options.  Fall risk: moderate- discussed PT, home fall assessment, medications.   Subjective:    [name] presents for [facility] Annual Wellness Visit.  Date of last medicare wellness visit was 09/27/2013.  This very nice 81 y.o. WWF  presents for 3 month follow up with Hypertension, Hyperlipidemia, Pre-Diabetes and Vitamin D Deficiency.    Patient is treated for HTN since 2013 & BP has been controlled at home. Today's BP is elevated at 154/68. Patient also has chAfib and is not on anticoagulants due to moderate to high fall risk.  Patient has had no complaints of any cardiac type chest pain, palpitations, dyspnea/orthopnea/PND, dizziness, claudication, or dependent edema.   Hyperlipidemia is controlled with diet & meds. Patient denies myalgias or other med SE's. Last Lipids were at goal - Total Chol 158; HDL 56; LDL  85; Trig 84 on 03/23/2014   Also, the patient has history of PreDiabetes and has had no symptoms of reactive hypoglycemia, diabetic polys, paresthesias or visual blurring.  Last A1c was   5.9% on  08/30/2014.   Further, the patient also has history of Vitamin D Deficiency and supplements vitamin D without any suspected side-effects. Last vitamin D was  48 on 08/30/2014.  Names of Other Physician/Practitioners you currently use: 1. [facility] here for primary care 2. Dr Tedra Coupe, eye doctor, last visit Sept 2015 3. No dentist, has dentures  Patient Care Team: Lucky Cowboy, MD as PCP - General (Internal Medicine) Bernette Redbird, MD as Consulting Physician (Gastroenterology) Cassell Clement, MD as Consulting Physician (Cardiology) Blima Ledger, OD (Optometry)  Medication  Review: Medication Sig  . acetaminophen  325 MG tablet Take  every 6hours as needed for  headache.  Marland Kitchen aspirin 81 MG tablet Take  at bedtime.   Idolina Primer Take 1 tab 2 times daily.  Marland Kitchen VITAMIN  1000 UNITS CAPS Take 1,000 Units  2 times daily.   Marland Kitchen FLAX SEED OIL  Take 1 tab 2  times daily.   . hydrochlorothiazide25 MG Take 1 tab daily.  Marland Kitchen lisinopril  5 MG tablet Take 0.5 tablets  daily.  Marland Kitchen MAGNESIUM  Take 1 tab daily.  . ondansetron (ZOFRAN) 4 MG tablet Take 1 tab every 6  hours.  . promethazine  25 MG tablet Take 1 tab every 6 hr as needed for nausea or vomiting.  Marland Kitchen SYNTHROID 75 MCG tablet TAKE 1 TAB DAILY BEFORE BREAKFAST.  Marland Kitchen traMADol  50 MG tablet TAKE 1 TAB 3-4 TIMES A DAY AS NEEDED FOR PAIN  . triamcinolone  0.025 % cream Apply 1 application topically as needed (for rash).    Current Problems (verified) Patient Active Problem List   Diagnosis Date Noted  . Vitamin D deficiency 08/30/2014  . Medication management 08/30/2014  . Hyperlipidemia 06/29/2013  . Prediabetes   . GERD (gastroesophageal reflux disease)   . Hypertension   . AV block, 1st degree   . CVD (cerebrovascular disease)   . Osteoarthritis   . Hypothyroidism   . Atrial fibrillation     Screening Tests Health Maintenance  Topic Date Due  . DEXA SCAN  02/24/1982  . INFLUENZA VACCINE  12/25/2014  . COLONOSCOPY  03/16/2018  . TETANUS/TDAP  03/24/2023  . ZOSTAVAX  Completed  . PNA vac Low Risk Adult  Completed    Immunization History  Administered Date(s) Administered  . Influenza Split 02/03/2013  . Influenza, High Dose Seasonal PF 02/22/2014  . Pneumococcal Conjugate-13 04/11/2014  . Pneumococcal-Unspecified 05/26/2009  . Td 03/23/2013  . Zoster 05/26/2005    Preventative care: Last colonoscopy: 2009  History reviewed: allergies, current medications, past family history, past medical history, past social history, past surgical history and problem list  Risk Factors: Tobacco History  Substance Use Topics  . Smoking status: Never Smoker   . Smokeless tobacco: Not on file  . Alcohol  Use: No   She does not smoke.  Patient is not a former smoker. Are there smokers in your home (other than you)?  No Alcohol Current alcohol use: none  Caffeine Current caffeine use: coffee 3 cups /day  Exercise Current exercise: none  Nutrition/Diet Current diet: in general, a "healthy" diet    Cardiac risk factors: advanced age (older than 7 for men, 36 for women), dyslipidemia, hypertension and sedentary lifestyle.  Depression Screen (Note: if answer to either of the following is "Yes", a more complete depression screening is indicated)   Q1: Over the past two weeks, have you felt down, depressed or hopeless? No  Q2: Over the past two weeks, have you felt little interest or pleasure in doing things? No  Have you lost interest or pleasure in daily life? No  Do you often feel hopeless? No  Do you cry easily over simple problems? No  Activities of Daily Living In your present state of health, do you have any difficulty performing the following activities?:  Driving? No Managing money?  No Feeding yourself? No Getting from bed to chair? No Climbing a flight of stairs? No Preparing food and eating?: No Bathing or showering? No Getting dressed: No Getting to the  toilet? No Using the toilet:No Moving around from place to place: No In the past year have you fallen or had a near fall?:Yes   Are you sexually active?  No  Do you have more than one partner?  No  Vision Difficulties: No  Hearing Difficulties: No Do you often ask people to speak up or repeat themselves? No Do you experience ringing or noises in your ears? No Do you have difficulty understanding soft or whispered voices? Sometimes.  Cognition  Do you feel that you have a problem with memory?No  Do you often misplace items? No  Do you feel safe at home?  Yes  Advanced directives Does patient have a Health Care Power of Attorney? Yes Does patient have a Living Will? Yes  Past Medical History  Diagnosis  Date  . Syncope and collapse     Negative loop recorder in the past  . Hypertension   . AV block, 1st degree   . CVD (cerebrovascular disease)     History of CVA  . Hypokalemia   . Diarrhea   . Atrial fibrillation     not a candidate for coumadin due to falls  . Pneumonia 09/11  . Traumatic enucleation of eye     left eye  . History of echocardiogram 9/11    normal EF, mild to mod MR & TR, mod pulmonary HTN  . Varicose veins   . Advanced age   . Prediabetes   . Hypothyroidism   . GERD (gastroesophageal reflux disease)   . Osteoarthritis    Past Surgical History  Procedure Laterality Date  . Back surgery    . Inguinal hernia repair      left sided   ROS: Constitutional: Denies fever, chills, weight loss/gain, headaches, insomnia, fatigue, night sweats, and change in appetite. Eyes: Denies redness, blurred vision, diplopia, discharge, itchy, watery eyes.  ENT: Denies discharge, congestion, post nasal drip, epistaxis, sore throat, earache, hearing loss, dental pain, Tinnitus, Vertigo, Sinus pain, snoring.  Cardio: Denies chest pain, palpitations, irregular heartbeat, syncope, dyspnea, diaphoresis, orthopnea, PND, claudication, edema Respiratory: denies cough, dyspnea, DOE, pleurisy, hoarseness, laryngitis, wheezing.  Gastrointestinal: Denies dysphagia, heartburn, reflux, water brash, pain, cramps, nausea, vomiting, bloating, diarrhea, constipation, hematemesis, melena, hematochezia, jaundice, hemorrhoids Genitourinary: Denies dysuria, frequency, urgency, nocturia, hesitancy, discharge, hematuria, flank pain Breast: Breast lumps, nipple discharge, bleeding.  Musculoskeletal: Denies arthralgia, myalgia, stiffness, Jt. Swelling, pain, limp, and strain/sprain. Denies falls. Skin: Denies puritis, rash, hives, warts, acne, eczema, changing in skin lesion Neuro: No weakness, tremor, incoordination, spasms, paresthesia, pain Psychiatric: Denies confusion, memory loss, sensory loss.  Denies Depression. Endocrine: Denies change in weight, skin, hair change, nocturia, and paresthesia, diabetic polys, visual blurring, hyper / hypo glycemic episodes.  Heme/Lymph: No excessive bleeding, bruising, enlarged lymph nodes  Objective:     BP 154/68   Pulse 84  Temp 97.7 F   Resp 16  Ht 5\' 2"    Wt 109 lb 12.8 oz     BMI 20.08   General Appearance: Frail elderly wasted appearing white  female and in no apparent distress. Eyes: PERRLA, EOMs, conjunctiva no swelling or erythema, normal fundi and vessels. Sinuses: No frontal/maxillary tenderness ENT/Mouth: EACs patent / TMs  nl. Nares clear without erythema, swelling, mucoid exudates. Oral hygiene is good. No erythema, swelling, or exudate. Tongue normal, non-obstructing. Tonsils not swollen or erythematous. Hearing normal. Edentous Neck: Supple, thyroid normal. No bruits, nodes or JVD. Respiratory: Respiratory effort normal.  BS equal and clear bilateral without rales,  rhonci, wheezing or stridor. Cardio: Heart sounds are normal with regular rate and rhythm and no murmurs, rubs or gallops. Peripheral pulses are 1+ and equal bilaterally without edema. No aortic or femoral bruits. Chest: severe gibbous deformity. Abdomen: Flat, soft  with nl bowel sounds. Nontender, no guarding, rebound, hernias, masses, or organomegaly.  Lymphatics: Non tender without lymphadenopathy.  Musculoskeletal: Generalized decrease in muscle power, tone and bulk. In wheel chair.  Skin: Warm and dry without rashes, lesions, cyanosis, clubbing or  ecchymosis.  Neuro: Cranial nerves intact, reflexes equal bilaterally. Decreased muscle tone, no cerebellar symptoms. Sensation intact.  Pysch: Alert and oriented X 3, flat affect, Insight and Judgment limited.  Cognitive Testing  Alert? Yes  Normal Appearance?Yes  Oriented to person? Yes  Place? Yes   Time? Yes  Recall of three objects?  Yes  Can perform simple calculations? Yes  Displays appropriate  judgment? Yes  Can read the correct time from a watch/clock?Yes  Medicare Attestation I have personally reviewed: The patient's medical and social history Their use of alcohol, tobacco or illicit drugs Their current medications and supplements The patient's functional ability including ADLs,fall risks, home safety risks, cognitive, and hearing and visual impairment Diet and physical activities Evidence for depression or mood disorders  The patient's weight, height, BMI, and visual acuity have been recorded in the chart.  I have made referrals, counseling, and provided education to the patient based on review of the above and I have provided the patient with a written personalized care plan for preventive services.  Over 40 minutes of exam, counseling, chart review was performed.   Limuel Nieblas DAVID, MD   08/31/2014

## 2014-09-11 ENCOUNTER — Ambulatory Visit: Payer: Medicare Other | Admitting: Nurse Practitioner

## 2014-09-13 ENCOUNTER — Ambulatory Visit (INDEPENDENT_AMBULATORY_CARE_PROVIDER_SITE_OTHER): Payer: Medicare Other | Admitting: Nurse Practitioner

## 2014-09-13 ENCOUNTER — Encounter: Payer: Self-pay | Admitting: Nurse Practitioner

## 2014-09-13 VITALS — BP 128/76 | HR 83 | Resp 16 | Ht 62.0 in | Wt 108.0 lb

## 2014-09-13 DIAGNOSIS — I480 Paroxysmal atrial fibrillation: Secondary | ICD-10-CM

## 2014-09-13 DIAGNOSIS — I10 Essential (primary) hypertension: Secondary | ICD-10-CM

## 2014-09-13 NOTE — Progress Notes (Signed)
CARDIOLOGY OFFICE NOTE  Date:  09/13/2014    [name] Date of Birth: 02/17/1915 Medical Record #[id number]  PCP:  Nadean CorwinMCKEOWN,WILLIAM DAVID, MD  Cardiologist:  Nahser/Makella Buckingham    Chief Complaint  Patient presents with  . Hypertension    Follow up visit - seen for Dr. Elease HashimotoNahser     History of Present Illness: [name] is a 81 y.o. female who presents today for a follow up visit. Seen for Dr. Elease HashimotoNahser. Former patient of Dr. Ronnald Nianennant's. Has a remote history of syncope with a past loop record implant that turned out to be unremarkable. This was many years ago. Other issues include PAF, first degree AV block, HTN, cerebrovascular disease, OA, hypothyroidism and advanced age.   Was admitted to the hospital in December 2013 with a recurrent syncopal spell. She had been cooking in the kitchen and got weak and went to sit down. She lost consciousness and was out for about 30 to 60 seconds, then regained consciousness. No seizure, HA, weakness, etc. EMS was called. BP was low (60's/40's). She had a sinus pause on EKG of about 2.5 seconds (daughter has brought in strips for review). She had been fairly active and had done 3 loads of laundry that day. Dr. Elease HashimotoNahser saw her and felt that this was orthostasis/dehydation. Her lasix was stopped. Her beta blocker was stopped. BP is to be more liberal and we are not to achieve aggressive control. She was given some IV fluids. Her discharge summary mentions atrial fib during that time but all of her EKGs that I see show sinus rhythm. His suggestion was to not be aggressive with her blood pressure control and to use support stockings.   Seen back in October of 2014. Was doing ok for the most part. Had been put back on her low dose beta blocker (atenolol 12.5 mg) due to having headaches and I stopped it - I felt her headaches were more related to arthritis. Called in early January with elevated BP - added back 10 mg of Lasix just if systolic above 175.   2  prior ER visits back in June of 2015 - BP sky high. Low dose ACE started. Then having some left hand weakness/numbness. Sent back to the ER due to concerns for a stroke - BP running low and I cut the ACE back.  I last saw her in October. She seemed to be holding her own for the most part.  Comes in today. Here with her daughter today. She is doing ok. BP at home is "behaving". No chest pain. No recent falls. Mostly limited by back and leg pain. Has had recent labs. Family ok with how she is doing as well.   Past Medical History  Diagnosis Date  . Syncope and collapse     Negative loop recorder in the past  . Hypertension   . AV block, 1st degree   . CVD (cerebrovascular disease)     History of CVA  . Hypokalemia   . Diarrhea   . Atrial fibrillation     not a candidate for coumadin due to falls  . Pneumonia 09/11  . Traumatic enucleation of eye     left eye  . History of echocardiogram 9/11    normal EF, mild to mod MR & TR, mod pulmonary HTN  . Varicose veins   . Advanced age   . Prediabetes   . Hypothyroidism   . GERD (gastroesophageal reflux disease)   .  Osteoarthritis     Past Surgical History  Procedure Laterality Date  . Back surgery    . Inguinal hernia repair      left sided     Medications: Current Outpatient Prescriptions  Medication Sig Dispense Refill  . acetaminophen (TYLENOL) 325 MG tablet Take 325 mg by mouth every 6 (six) hours as needed for headache.    Marland Kitchen aspirin 81 MG tablet Take 81 mg by mouth at bedtime.     . beta carotene w/minerals (OCUVITE) tablet Take 1 tablet by mouth 2 (two) times daily.    . Cholecalciferol (VITAMIN D3) 1000 UNITS CAPS Take 1,000 Units by mouth 2 (two) times daily.     . Flaxseed, Linseed, (FLAX SEED OIL PO) Take 1 tablet by mouth 2 (two) times daily.     . hydrochlorothiazide (HYDRODIURIL) 25 MG tablet Take 1 tablet (25 mg total) by mouth daily. 90 tablet 1  . lisinopril (PRINIVIL,ZESTRIL) 5 MG tablet Take 0.5 tablets (2.5  mg total) by mouth daily. 30 tablet 6  . MAGNESIUM PO Take 1 tablet by mouth daily.    . ondansetron (ZOFRAN) 4 MG tablet Take 1 tablet (4 mg total) by mouth every 6 (six) hours. 12 tablet 0  . promethazine (PHENERGAN) 25 MG tablet Take 1 tablet (25 mg total) by mouth every 6 (six) hours as needed for nausea or vomiting. 90 tablet 0  . SYNTHROID 75 MCG tablet TAKE 1 TABLET (75 MCG TOTAL) BY MOUTH DAILY BEFORE BREAKFAST. 90 tablet 1  . traMADol (ULTRAM) 50 MG tablet TAKE 1 TABLET BY MOUTH 3-4 TIMES A DAY AS NEEDED FOR PAIN 90 tablet 0  . triamcinolone (KENALOG) 0.025 % cream Apply 1 application topically as needed (for rash).      No current facility-administered medications for this visit.    Allergies: Allergies  Allergen Reactions  . Augmentin [Amoxicillin-Pot Clavulanate] Diarrhea  . Ciprocin-Fluocin-Procin [Fluocinolone Acetonide] Nausea And Vomiting  . Ciprofloxacin Hcl Nausea And Vomiting  . Codeine Hives and Nausea And Vomiting  . Sulfa Drugs Cross Reactors Hives and Nausea And Vomiting    Social History: The patient  reports that she has never smoked. She does not have any smokeless tobacco history on file. She reports that she does not drink alcohol or use illicit drugs.   Family History: The patient's family history includes Cancer in her brother; Heart attack in her brother and father; Stroke in her brother.   Review of Systems: Please see the history of present illness.      All other systems are reviewed and negative.   Physical Exam: VS:  BP 128/76 mmHg  Pulse 83  Resp 16  Ht  (1.575 m)  Wt 108 lb (48.988 kg)  BMI 19.75 kg/m2  SpO2 95% .  BMI Body mass index is 19.75 kg/(m^2).  Wt Readings from Last 3 Encounters:  09/13/14 108 lb (48.988 kg)  08/30/14 109 lb 12.8 oz (49.805 kg)  07/24/14 112 lb (50.803 kg)    General: Pleasant. She looks a little younger than her stated age. She is hard of hearing. She is in no acute distress.  HEENT: Normal. Neck:  Supple, no JVD, carotid bruits, or masses noted.  Cardiac: Irregular irregular rhythm. Rate is ok. No murmurs, rubs, or gallops. No edema.  Respiratory:  Lungs are clear to auscultation bilaterally with normal work of breathing.  GI: Soft and nontender.  MS: No deformity or atrophy. Gait and ROM intact. Skin: Warm and dry. Color is  normal.  Neuro:  Strength and sensation are intact and no gross focal deficits noted.  Psych: Alert, appropriate and with normal affect.   LABORATORY DATA:  EKG:  EKG is not ordered today.   Lab Results  Component Value Date   WBC 7.7 08/30/2014   HGB 13.2 08/30/2014   HCT 39.9 08/30/2014   PLT 297 08/30/2014   GLUCOSE 91 08/30/2014   CHOL 158 03/23/2014   TRIG 84 03/23/2014   HDL 56 03/23/2014   LDLCALC 85 03/23/2014   ALT 24 08/30/2014   AST 30 08/30/2014   NA 138 08/30/2014   K 4.8 08/30/2014   CL 102 08/30/2014   CREATININE 0.98 08/30/2014   BUN 26* 08/30/2014   CO2 25 08/30/2014   TSH 0.937 08/30/2014   INR 0.99 11/09/2013   HGBA1C 5.9* 08/30/2014   MICROALBUR 0.4 03/23/2014    BNP (last 3 results) No results for input(s): BNP in the last 8760 hours.  ProBNP (last 3 results) No results for input(s): PROBNP in the last 8760 hours.   Other Studies Reviewed Today:   Assessment/Plan: 1. HTN - no change with her current regimen.  2. Syncope - not recurred.   3. Advanced age   15. PAF - not a candidate for anticoagulation given age and unsteadiness.   Overall, she is holding her own. Family is happy with how she is doing. Continue with supportive care.   Current medicines are reviewed with the patient today.  The patient does not have concerns regarding medicines other than what has been noted above.  The following changes have been made:  See above.  Labs/ tests ordered today include:   No orders of the defined types were placed in this encounter.     Disposition:   FU with me in 6 months.   Patient is agreeable to  this plan and will call if any problems develop in the interim.   Signed: Rosalio Macadamia, RN, ANP-C 09/13/2014 3:33 PM  [facility] [address] [place], [place]  [address] Phone: [phone] Fax: [phone]

## 2014-09-13 NOTE — Patient Instructions (Signed)
We will be checking the following labs today - NONE   Medication Instructions:    Continue with your current medicines.     Testing/Procedures To Be Arranged:  N/A  Follow-Up:   We will see you back in 6 months    Other Special Instructions:   N/A  Call the [facility] office at [phone] if you have any questions, problems or concerns.      

## 2014-09-24 NOTE — Progress Notes (Signed)
Patient ID: [name], female   DOB: 01/20/1915, 81 y.o.   MRN: [id number]  MEDICARE ANNUAL WELLNESS VISIT AND OV  Assessment:   1. Essential hypertension  - TSH  2. Hyperlipidemia   3. Prediabetes  - Hemoglobin A1c - Insulin, random  4. Vitamin D deficiency  - Vit D  25 hydroxy (rtn osteoporosis monitoring)  5. Hypothyroidism, unspecified hypothyroidism type   6. Medication management  - CBC with Differential/Platelet - BASIC METABOLIC PANEL WITH GFR - Hepatic function panel - Magnesium  7. Chronic atrial fibrillation   8. CVD (cerebrovascular disease)   9. Routine general medical examination at a health care facility   10. Depression screen   11. At low risk for fall  12. At moderate risk for fall  Plan:   During the course of the visit the patient and family (son and daughter-in-law) were educated and counseled about appropriate screening and preventive services including:    Pneumococcal vaccine   Influenza vaccine  Td vaccine  Screening electrocardiogram  Bone densitometry screening  Colorectal cancer screening  Diabetes screening  Glaucoma screening  Nutrition counseling   Advanced directives: requested  Screening recommendations, referrals: Vaccinations:  Immunization History  Administered Date(s) Administered  . Influenza Split 02/03/2013  . Influenza, High Dose Seasonal PF 02/22/2014  . Pneumococcal Conjugate-13 04/11/2014  . Pneumococcal-Unspecified 05/26/2009  . Td 03/23/2013  . Zoster 05/26/2005  Hep B vaccine not indicated  Nutrition assessed and recommended  Colonoscopy 2009 Recommended yearly ophthalmology/optometry visit for glaucoma screening and checkup Recommended yearly dental visit for hygiene and checkup Advanced directives - yes  Conditions/risks identified: BMI: Discussed weight loss, diet, and increase physical activity.  Increase physical activity: AHA recommends 150 minutes of physical activity  a week.  Medications reviewed preDiabetes is not at goal, ACE/ARB therapy: Yes. Urinary Incontinence is not an issue: discussed non pharmacology and pharmacology options.  Fall risk: moderate- discussed PT, home fall assessment, medications.   Subjective:    [name] presents for [facility] Annual Wellness Visit.  Date of last medicare wellness visit was 09/27/2013.  This very nice 81 y.o. WWF  presents for 3 month follow up with Hypertension, Hyperlipidemia, Pre-Diabetes and Vitamin D Deficiency.    Patient is treated for HTN since 2013 & BP has been controlled at home. Today's BP is elevated at 154/68. Patient also has chAfib and is not on anticoagulants due to moderate to high fall risk.  Patient has had no complaints of any cardiac type chest pain, palpitations, dyspnea/orthopnea/PND, dizziness, claudication, or dependent edema.   Hyperlipidemia is controlled with diet & meds. Patient denies myalgias or other med SE's. Last Lipids were at goal - Total Chol 158; HDL 56; LDL  85; Trig 84 on 03/23/2014   Also, the patient has history of PreDiabetes and has had no symptoms of reactive hypoglycemia, diabetic polys, paresthesias or visual blurring.  Last A1c was   5.9% on  08/30/2014.   Further, the patient also has history of Vitamin D Deficiency and supplements vitamin D without any suspected side-effects. Last vitamin D was  48 on 08/30/2014.  Names of Other Physician/Practitioners you currently use: 1. [facility] here for primary care 2. Dr Tedra Coupe, eye doctor, last visit Sept 2015 3. No dentist, has dentures  Patient Care Team: Lucky Cowboy, MD as PCP - General (Internal Medicine) Bernette Redbird, MD as Consulting Physician (Gastroenterology) Cassell Clement, MD as Consulting Physician (Cardiology) Blima Ledger, OD (Optometry)  Medication  Review: Medication Sig  . acetaminophen  325 MG tablet Take  every 6hours as needed for  headache.  Marland Kitchen aspirin 81 MG tablet Take  at bedtime.   Idolina Primer Take 1 tab 2 times daily.  Marland Kitchen VITAMIN  1000 UNITS CAPS Take 1,000 Units  2 times daily.   Marland Kitchen FLAX SEED OIL  Take 1 tab 2  times daily.   . hydrochlorothiazide25 MG Take 1 tab daily.  Marland Kitchen lisinopril  5 MG tablet Take 0.5 tablets  daily.  Marland Kitchen MAGNESIUM  Take 1 tab daily.  . ondansetron (ZOFRAN) 4 MG tablet Take 1 tab every 6  hours.  . promethazine  25 MG tablet Take 1 tab every 6 hr as needed for nausea or vomiting.  Marland Kitchen SYNTHROID 75 MCG tablet TAKE 1 TAB DAILY BEFORE BREAKFAST.  Marland Kitchen traMADol  50 MG tablet TAKE 1 TAB 3-4 TIMES A DAY AS NEEDED FOR PAIN  . triamcinolone  0.025 % cream Apply 1 application topically as needed (for rash).    Current Problems (verified) Patient Active Problem List   Diagnosis Date Noted  . Vitamin D deficiency 08/30/2014  . Medication management 08/30/2014  . Hyperlipidemia 06/29/2013  . Prediabetes   . GERD (gastroesophageal reflux disease)   . Hypertension   . AV block, 1st degree   . CVD (cerebrovascular disease)   . Osteoarthritis   . Hypothyroidism   . Atrial fibrillation     Screening Tests Health Maintenance  Topic Date Due  . DEXA SCAN  02/24/1982  . INFLUENZA VACCINE  12/25/2014  . COLONOSCOPY  03/16/2018  . TETANUS/TDAP  03/24/2023  . ZOSTAVAX  Completed  . PNA vac Low Risk Adult  Completed    Immunization History  Administered Date(s) Administered  . Influenza Split 02/03/2013  . Influenza, High Dose Seasonal PF 02/22/2014  . Pneumococcal Conjugate-13 04/11/2014  . Pneumococcal-Unspecified 05/26/2009  . Td 03/23/2013  . Zoster 05/26/2005    Preventative care: Last colonoscopy: 2009  History reviewed: allergies, current medications, past family history, past medical history, past social history, past surgical history and problem list  Risk Factors: Tobacco History  Substance Use Topics  . Smoking status: Never Smoker   . Smokeless tobacco: Not on file  . Alcohol  Use: No   She does not smoke.  Patient is not a former smoker. Are there smokers in your home (other than you)?  No Alcohol Current alcohol use: none  Caffeine Current caffeine use: coffee 3 cups /day  Exercise Current exercise: none  Nutrition/Diet Current diet: in general, a "healthy" diet    Cardiac risk factors: advanced age (older than 7 for men, 36 for women), dyslipidemia, hypertension and sedentary lifestyle.  Depression Screen (Note: if answer to either of the following is "Yes", a more complete depression screening is indicated)   Q1: Over the past two weeks, have you felt down, depressed or hopeless? No  Q2: Over the past two weeks, have you felt little interest or pleasure in doing things? No  Have you lost interest or pleasure in daily life? No  Do you often feel hopeless? No  Do you cry easily over simple problems? No  Activities of Daily Living In your present state of health, do you have any difficulty performing the following activities?:  Driving? No Managing money?  No Feeding yourself? No Getting from bed to chair? No Climbing a flight of stairs? No Preparing food and eating?: No Bathing or showering? No Getting dressed: No Getting to the  toilet? No Using the toilet:No Moving around from place to place: No In the past year have you fallen or had a near fall?:Yes   Are you sexually active?  No  Do you have more than one partner?  No  Vision Difficulties: No  Hearing Difficulties: No Do you often ask people to speak up or repeat themselves? No Do you experience ringing or noises in your ears? No Do you have difficulty understanding soft or whispered voices? Sometimes.  Cognition  Do you feel that you have a problem with memory?No  Do you often misplace items? No  Do you feel safe at home?  Yes  Advanced directives Does patient have a Health Care Power of Attorney? Yes Does patient have a Living Will? Yes  Past Medical History  Diagnosis  Date  . Syncope and collapse     Negative loop recorder in the past  . Hypertension   . AV block, 1st degree   . CVD (cerebrovascular disease)     History of CVA  . Hypokalemia   . Diarrhea   . Atrial fibrillation     not a candidate for coumadin due to falls  . Pneumonia 09/11  . Traumatic enucleation of eye     left eye  . History of echocardiogram 9/11    normal EF, mild to mod MR & TR, mod pulmonary HTN  . Varicose veins   . Advanced age   . Prediabetes   . Hypothyroidism   . GERD (gastroesophageal reflux disease)   . Osteoarthritis    Past Surgical History  Procedure Laterality Date  . Back surgery    . Inguinal hernia repair      left sided   ROS: Constitutional: Denies fever, chills, weight loss/gain, headaches, insomnia, fatigue, night sweats, and change in appetite. Eyes: Denies redness, blurred vision, diplopia, discharge, itchy, watery eyes.  ENT: Denies discharge, congestion, post nasal drip, epistaxis, sore throat, earache, hearing loss, dental pain, Tinnitus, Vertigo, Sinus pain, snoring.  Cardio: Denies chest pain, palpitations, irregular heartbeat, syncope, dyspnea, diaphoresis, orthopnea, PND, claudication, edema Respiratory: denies cough, dyspnea, DOE, pleurisy, hoarseness, laryngitis, wheezing.  Gastrointestinal: Denies dysphagia, heartburn, reflux, water brash, pain, cramps, nausea, vomiting, bloating, diarrhea, constipation, hematemesis, melena, hematochezia, jaundice, hemorrhoids Genitourinary: Denies dysuria, frequency, urgency, nocturia, hesitancy, discharge, hematuria, flank pain Breast: Breast lumps, nipple discharge, bleeding.  Musculoskeletal: Denies arthralgia, myalgia, stiffness, Jt. Swelling, pain, limp, and strain/sprain. Denies falls. Skin: Denies puritis, rash, hives, warts, acne, eczema, changing in skin lesion Neuro: No weakness, tremor, incoordination, spasms, paresthesia, pain Psychiatric: Denies confusion, memory loss, sensory loss.  Denies Depression. Endocrine: Denies change in weight, skin, hair change, nocturia, and paresthesia, diabetic polys, visual blurring, hyper / hypo glycemic episodes.  Heme/Lymph: No excessive bleeding, bruising, enlarged lymph nodes  Objective:     BP 154/68   Pulse 84  Temp 97.7 F   Resp 16  Ht 5\' 2"    Wt 109 lb 12.8 oz     BMI 20.08   General Appearance: Frail elderly wasted appearing white  female and in no apparent distress. Eyes: PERRLA, EOMs, conjunctiva no swelling or erythema, normal fundi and vessels. Sinuses: No frontal/maxillary tenderness ENT/Mouth: EACs patent / TMs  nl. Nares clear without erythema, swelling, mucoid exudates. Oral hygiene is good. No erythema, swelling, or exudate. Tongue normal, non-obstructing. Tonsils not swollen or erythematous. Hearing normal. Edentous Neck: Supple, thyroid normal. No bruits, nodes or JVD. Respiratory: Respiratory effort normal.  BS equal and clear bilateral without rales,  rhonci, wheezing or stridor. Cardio: Heart sounds are normal with regular rate and rhythm and no murmurs, rubs or gallops. Peripheral pulses are 1+ and equal bilaterally without edema. No aortic or femoral bruits. Chest: severe gibbous deformity. Abdomen: Flat, soft  with nl bowel sounds. Nontender, no guarding, rebound, hernias, masses, or organomegaly.  Lymphatics: Non tender without lymphadenopathy.  Musculoskeletal: Generalized decrease in muscle power, tone and bulk. In wheel chair.  Skin: Warm and dry without rashes, lesions, cyanosis, clubbing or  ecchymosis.  Neuro: Cranial nerves intact, reflexes equal bilaterally. Decreased muscle tone, no cerebellar symptoms. Sensation intact.  Pysch: Alert and oriented X 3, flat affect, Insight and Judgment limited.  Cognitive Testing  Alert? Yes  Normal Appearance?Yes  Oriented to person? Yes  Place? Yes   Time? Yes  Recall of three objects?  Yes  Can perform simple calculations? Yes  Displays appropriate  judgment? Yes  Can read the correct time from a watch/clock?Yes  Medicare Attestation I have personally reviewed: The patient's medical and social history Their use of alcohol, tobacco or illicit drugs Their current medications and supplements The patient's functional ability including ADLs,fall risks, home safety risks, cognitive, and hearing and visual impairment Diet and physical activities Evidence for depression or mood disorders  The patient's weight, height, BMI, and visual acuity have been recorded in the chart.  I have made referrals, counseling, and provided education to the patient based on review of the above and I have provided the patient with a written personalized care plan for preventive services.  Over 40 minutes of exam, counseling, chart review was performed.   Anne Warda DAVID, MD   09/24/2014

## 2014-09-24 NOTE — Progress Notes (Signed)
Patient ID: [name], female   DOB: 07/22/1914, 81 y.o.   MRN: [id number]

## 2014-11-01 ENCOUNTER — Ambulatory Visit (INDEPENDENT_AMBULATORY_CARE_PROVIDER_SITE_OTHER): Payer: Medicare Other | Admitting: Internal Medicine

## 2014-11-01 ENCOUNTER — Encounter: Payer: Self-pay | Admitting: Internal Medicine

## 2014-11-01 VITALS — BP 136/64 | HR 72 | Temp 98.0°F | Resp 16 | Ht 62.0 in | Wt 107.0 lb

## 2014-11-01 DIAGNOSIS — R7309 Other abnormal glucose: Secondary | ICD-10-CM

## 2014-11-01 DIAGNOSIS — E559 Vitamin D deficiency, unspecified: Secondary | ICD-10-CM | POA: Diagnosis not present

## 2014-11-01 DIAGNOSIS — E039 Hypothyroidism, unspecified: Secondary | ICD-10-CM | POA: Diagnosis not present

## 2014-11-01 DIAGNOSIS — I10 Essential (primary) hypertension: Secondary | ICD-10-CM | POA: Diagnosis not present

## 2014-11-01 DIAGNOSIS — Z79899 Other long term (current) drug therapy: Secondary | ICD-10-CM

## 2014-11-01 DIAGNOSIS — R7303 Prediabetes: Secondary | ICD-10-CM

## 2014-11-01 DIAGNOSIS — E782 Mixed hyperlipidemia: Secondary | ICD-10-CM

## 2014-11-01 LAB — LIPID PANEL
Cholesterol: 202 mg/dL — ABNORMAL HIGH (ref 0–200)
HDL: 77 mg/dL (ref >=46)
LDL Cholesterol: 105 mg/dL — ABNORMAL HIGH (ref 0–99)
Total CHOL/HDL Ratio: 2.6 Ratio
Triglycerides: 100 mg/dL (ref <150)
VLDL: 20 mg/dL (ref 0–40)

## 2014-11-01 LAB — CBC WITH DIFFERENTIAL/PLATELET
Basophils Absolute: 0.1 10*3/uL (ref 0.0–0.1)
Basophils Relative: 1 % (ref 0–1)
Eosinophils Absolute: 0.5 10*3/uL (ref 0.0–0.7)
Eosinophils Relative: 6 % — ABNORMAL HIGH (ref 0–5)
HEMATOCRIT: 38.5 % (ref 36.0–46.0)
Hemoglobin: 13.1 g/dL (ref 12.0–15.0)
LYMPHS ABS: 2.4 10*3/uL (ref 0.7–4.0)
Lymphocytes Relative: 28 % (ref 12–46)
MCH: 30.8 pg (ref 26.0–34.0)
MCHC: 34 g/dL (ref 30.0–36.0)
MCV: 90.4 fL (ref 78.0–100.0)
MONOS PCT: 15 % — AB (ref 3–12)
MPV: 10.1 fL (ref 8.6–12.4)
Monocytes Absolute: 1.3 10*3/uL — ABNORMAL HIGH (ref 0.1–1.0)
NEUTROS PCT: 50 % (ref 43–77)
Neutro Abs: 4.2 10*3/uL (ref 1.7–7.7)
Platelets: 296 10*3/uL (ref 150–400)
RBC: 4.26 MIL/uL (ref 3.87–5.11)
RDW: 13.7 % (ref 11.5–15.5)
WBC: 8.4 10*3/uL (ref 4.0–10.5)

## 2014-11-01 LAB — BASIC METABOLIC PANEL WITH GFR
BUN: 26 mg/dL — AB (ref 6–23)
CALCIUM: 9.9 mg/dL (ref 8.4–10.5)
CO2: 27 mEq/L (ref 19–32)
Chloride: 101 mEq/L (ref 96–112)
Creat: 1.02 mg/dL (ref 0.50–1.10)
GFR, EST NON AFRICAN AMERICAN: 46 mL/min — AB
GFR, Est African American: 53 mL/min — ABNORMAL LOW
Glucose, Bld: 88 mg/dL (ref 70–99)
Potassium: 5 mEq/L (ref 3.5–5.3)
Sodium: 138 mEq/L (ref 135–145)

## 2014-11-01 LAB — HEPATIC FUNCTION PANEL
ALT: 19 U/L (ref 0–35)
AST: 27 U/L (ref 0–37)
Albumin: 4.4 g/dL (ref 3.5–5.2)
Alkaline Phosphatase: 52 U/L (ref 39–117)
Bilirubin, Direct: 0.2 mg/dL (ref 0.0–0.3)
Indirect Bilirubin: 0.4 mg/dL (ref 0.2–1.2)
TOTAL PROTEIN: 6.9 g/dL (ref 6.0–8.3)
Total Bilirubin: 0.6 mg/dL (ref 0.2–1.2)

## 2014-11-01 LAB — MAGNESIUM: Magnesium: 2.1 mg/dL (ref 1.5–2.5)

## 2014-11-01 LAB — TSH: TSH: 1.415 u[IU]/mL (ref 0.350–4.500)

## 2014-11-01 MED ORDER — PROMETHAZINE HCL 25 MG PO TABS: 25.0000 mg | ORAL_TABLET | Freq: Four times a day (QID) | ORAL | Status: DC | PRN

## 2014-11-01 MED ORDER — TRAMADOL HCL 50 MG PO TABS: ORAL_TABLET | ORAL | Status: DC

## 2014-11-01 NOTE — Patient Instructions (Signed)

## 2014-11-01 NOTE — Progress Notes (Signed)
Patient ID: MRYTLE BENTO, female   DOB: 1915/03/08, 81 y.o.   MRN: [id number]  Assessment and Plan:  Hypertension:  -Continue current medication,  -monitor blood pressure at home.  -Continue DASH diet.   -Reminder to go to the ER if any CP, SOB, nausea, dizziness, severe HA, changes vision/speech, left arm numbness and tingling, and jaw pain.  Cholesterol: -Continue diet and exercise.  -Check cholesterol.   Pre-diabetes: -Continue diet and exercise.  -Check A1C  Vitamin D Def: -check level -continue medications.   Continue diet and meds as discussed. Further disposition pending results of labs.  HPI 81 y.o. female  presents for 3 month follow up with hypertension, hyperlipidemia, prediabetes and vitamin D.   Her blood pressure has been controlled at home, today their BP is BP: 136/64 mmHg.   She does not workout. She denies chest pain, shortness of breath, dizziness.   She is not on cholesterol medication and denies myalgias. Her cholesterol is at goal. The cholesterol last visit was:   Lab Results  Component Value Date   CHOL 158 03/23/2014   HDL 56 03/23/2014   LDLCALC 85 03/23/2014   TRIG 84 03/23/2014   CHOLHDL 2.8 03/23/2014     She has been working on diet and exercise for prediabetes, and denies foot ulcerations, hyperglycemia, hypoglycemia , increased appetite, nausea, paresthesia of the feet, polydipsia, polyuria, visual disturbances, vomiting and weight loss. Last A1C in the office was:  Lab Results  Component Value Date   HGBA1C 5.9* 08/30/2014    Patient is on Vitamin D supplement.  Lab Results  Component Value Date   VD25OH 48 08/30/2014      Current Medications:  Current Outpatient Prescriptions on File Prior to Visit  Medication Sig Dispense Refill  . acetaminophen (TYLENOL) 325 MG tablet Take 325 mg by mouth every 6 (six) hours as needed for headache.    Marland Kitchen aspirin 81 MG tablet Take 81 mg by mouth at bedtime.     . beta carotene w/minerals  (OCUVITE) tablet Take 1 tablet by mouth 2 (two) times daily.    . Cholecalciferol (VITAMIN D3) 1000 UNITS CAPS Take 1,000 Units by mouth 2 (two) times daily.     . Flaxseed, Linseed, (FLAX SEED OIL PO) Take 1 tablet by mouth 2 (two) times daily.     . hydrochlorothiazide (HYDRODIURIL) 25 MG tablet Take 1 tablet (25 mg total) by mouth daily. 90 tablet 1  . lisinopril (PRINIVIL,ZESTRIL) 5 MG tablet Take 0.5 tablets (2.5 mg total) by mouth daily. 30 tablet 6  . MAGNESIUM PO Take 1 tablet by mouth daily.    . ondansetron (ZOFRAN) 4 MG tablet Take 1 tablet (4 mg total) by mouth every 6 (six) hours. 12 tablet 0  . promethazine (PHENERGAN) 25 MG tablet Take 1 tablet (25 mg total) by mouth every 6 (six) hours as needed for nausea or vomiting. 90 tablet 0  . SYNTHROID 75 MCG tablet TAKE 1 TABLET (75 MCG TOTAL) BY MOUTH DAILY BEFORE BREAKFAST. 90 tablet 1  . traMADol (ULTRAM) 50 MG tablet TAKE 1 TABLET BY MOUTH 3-4 TIMES A DAY AS NEEDED FOR PAIN 90 tablet 0  . triamcinolone (KENALOG) 0.025 % cream Apply 1 application topically as needed (for rash).      No current facility-administered medications on file prior to visit.    Medical History:  Past Medical History  Diagnosis Date  . Syncope and collapse     Negative loop recorder in the past  .  Hypertension   . AV block, 1st degree   . CVD (cerebrovascular disease)     History of CVA  . Hypokalemia   . Diarrhea   . Atrial fibrillation     not a candidate for coumadin due to falls  . Pneumonia 09/11  . Traumatic enucleation of eye     left eye  . History of echocardiogram 9/11    normal EF, mild to mod MR & TR, mod pulmonary HTN  . Varicose veins   . Advanced age   . Prediabetes   . Hypothyroidism   . GERD (gastroesophageal reflux disease)   . Osteoarthritis     Allergies:  Allergies  Allergen Reactions  . Augmentin [Amoxicillin-Pot Clavulanate] Diarrhea  . Ciprocin-Fluocin-Procin [Fluocinolone Acetonide] Nausea And Vomiting  .  Ciprofloxacin Hcl Nausea And Vomiting  . Codeine Hives and Nausea And Vomiting  . Sulfa Drugs Cross Reactors Hives and Nausea And Vomiting     Review of Systems:  Review of Systems  Constitutional: Negative for fever, chills and malaise/fatigue.  HENT: Negative for congestion and sore throat.   Respiratory: Negative for cough, shortness of breath and wheezing.   Cardiovascular: Negative for chest pain, palpitations and leg swelling.  Gastrointestinal: Positive for heartburn (occasionally) and nausea (with tramadol). Negative for vomiting, diarrhea, constipation, blood in stool and melena.  Genitourinary: Negative.   Skin: Negative.   Neurological: Negative for sensory change, loss of consciousness and headaches.  Psychiatric/Behavioral: Negative for depression. The patient is not nervous/anxious and does not have insomnia.     Family history- Review and unchanged  Social history- Review and unchanged  Physical Exam: BP 136/64 mmHg  Pulse 72  Temp(Src) 98 F (36.7 C) (Temporal)  Resp 16  Ht 5\' 2"  (1.575 m)  Wt 107 lb (48.535 kg)  BMI 19.57 kg/m2 Wt Readings from Last 3 Encounters:  11/01/14 107 lb (48.535 kg)  09/13/14 108 lb (48.988 kg)  08/30/14 109 lb 12.8 oz (49.805 kg)    General Appearance: Well nourished well developed, in no apparent distress. Eyes: PERRLA, EOMs, conjunctiva no swelling or erythema ENT/Mouth: Ear canals normal without obstruction, swelling, erythma, discharge.  TMs normal bilaterally.  Oropharynx moist, clear, without exudate, or postoropharyngeal swelling. Neck: Supple, thyroid normal,no cervical adenopathy  Respiratory: Respiratory effort normal, Breath sounds clear A&P without rhonchi, wheeze, or rale.  No retractions, no accessory usage. Cardio: RRR with no MRGs. Brisk peripheral pulses without edema. Varicose veins bilaterally.  Hemosideran deposits pretibially. Abdomen: Soft, + BS,  Non tender, no guarding, rebound, hernias,  masses. Musculoskeletal: Full ROM, 5/5 strength, Normal gait Skin: Warm, dry without rashes, lesions, ecchymosis.  Neuro: Awake and oriented X 3, Cranial nerves intact. Normal muscle tone, no cerebellar symptoms. Psych: Normal affect, Insight and Judgment appropriate.    FORCUCCI, Raja Liska, PA-C 2:19 PM [facility]

## 2014-11-02 LAB — VITAMIN D 25 HYDROXY (VIT D DEFICIENCY, FRACTURES): Vit D, 25-Hydroxy: 49 ng/mL (ref 30–100)

## 2014-11-02 LAB — HEMOGLOBIN A1C
Hgb A1c MFr Bld: 5.9 % — ABNORMAL HIGH (ref <5.7)
MEAN PLASMA GLUCOSE: 123 mg/dL — AB (ref <117)

## 2014-11-02 LAB — INSULIN, RANDOM: Insulin: 9.7 u[IU]/mL (ref 2.0–19.6)

## 2015-01-15 ENCOUNTER — Other Ambulatory Visit: Payer: Self-pay | Admitting: Physician Assistant

## 2015-02-01 ENCOUNTER — Other Ambulatory Visit: Payer: Self-pay | Admitting: Cardiovascular Disease

## 2015-02-07 ENCOUNTER — Ambulatory Visit (INDEPENDENT_AMBULATORY_CARE_PROVIDER_SITE_OTHER): Payer: Medicare Other | Admitting: Internal Medicine

## 2015-02-07 ENCOUNTER — Encounter: Payer: Self-pay | Admitting: Internal Medicine

## 2015-02-07 VITALS — BP 122/70 | HR 72 | Temp 97.5°F | Resp 16 | Ht 62.0 in | Wt 111.2 lb

## 2015-02-07 DIAGNOSIS — Z6820 Body mass index (BMI) 20.0-20.9, adult: Secondary | ICD-10-CM

## 2015-02-07 DIAGNOSIS — E782 Mixed hyperlipidemia: Secondary | ICD-10-CM | POA: Diagnosis not present

## 2015-02-07 DIAGNOSIS — Z23 Encounter for immunization: Secondary | ICD-10-CM

## 2015-02-07 DIAGNOSIS — R7309 Other abnormal glucose: Secondary | ICD-10-CM | POA: Diagnosis not present

## 2015-02-07 DIAGNOSIS — E039 Hypothyroidism, unspecified: Secondary | ICD-10-CM

## 2015-02-07 DIAGNOSIS — I10 Essential (primary) hypertension: Secondary | ICD-10-CM | POA: Diagnosis not present

## 2015-02-07 DIAGNOSIS — Z79899 Other long term (current) drug therapy: Secondary | ICD-10-CM | POA: Diagnosis not present

## 2015-02-07 DIAGNOSIS — R7303 Prediabetes: Secondary | ICD-10-CM

## 2015-02-07 DIAGNOSIS — E559 Vitamin D deficiency, unspecified: Secondary | ICD-10-CM | POA: Diagnosis not present

## 2015-02-07 NOTE — Progress Notes (Signed)
Patient ID: [name], female   DOB: Oct 28, 1914, 81 y.o.   MRN: [id number]   This very nice & spry  81 y.o. WWF presents for 3 month follow up with Hypertension, Hyperlipidemia, Pre-Diabetes and Vitamin D Deficiency.    Patient is treated for HTN & BP has been controlled at home. Today's BP: 122/70 mmHg. Patient has had no complaints of any cardiac type chest pain, palpitations, dyspnea/orthopnea/PND, dizziness, claudication, or dependent edema.   Hyperlipidemia is controlled with diet & meds. Patient denies myalgias or other med SE's. Last Lipids were near goal - Cholesterol 202*; HDL 77; LDL 105*; Triglycerides 100 on 11/01/2014.    Also, the patient has history of PreDiabetes and has had no symptoms of reactive hypoglycemia, diabetic polys, paresthesias or visual blurring.  Last A1c was 5.9% in June 2016.   Further, the patient also has history of Vitamin D Deficiency and supplements vitamin D without any suspected side-effects. Last vitamin D was 49 on 11/01/2014.  Medication Sig  . acetaminophen  325 MG tablet Take 325 mg by mouth every 6 (six) hours as needed for headache.  Marland Kitchen aspirin 81 MG tablet Take 81 mg by mouth at bedtime.   Idolina Primer Take 1 tablet by mouth 2 (two) times daily.  Marland Kitchen VITAMIN D 1000 UNITS Take 1,000 Units by mouth 2 (two) times daily.   Marland Kitchen FLAX SEED OIL  Take 1 tablet by mouth 2 (two) times daily.   . hctz 25 MG tablet Take 1 tablet (25 mg total) by mouth daily.  Marland Kitchen lisinopril  5 MG tablet TAKE 1/2 TABLET BY MOUTH DAILY  . MAGNESIUM PO Take 1 tablet by mouth daily.  . ondansetron  4 MG tablet Take 1 tablet (4 mg total) by mouth every 6 (six) hours.  . promethazine  25 MG tablet Take 1 tablet (25 mg total) by mouth every 6 (six) hours as needed for nausea or vomiting.  Marland Kitchen SYNTHROID 75 MCG tablet TAKE 1 TABLET BY MOUTH DAILY BEFORE BREAKFAST  . traMADol  50 MG tablet TAKE 1 TABLET BY MOUTH 3-4 TIMES A DAY AS NEEDED FOR PAIN  . triamcinolone  0.025 % cream Apply 1  application topically as needed (for rash).    Allergies  Allergen Reactions  . Augmentin [Amoxicillin-Pot Clavulanate] Diarrhea  . Ciprocin-Fluocin-Procin [Fluocinolone Acetonide] Nausea And Vomiting  . Ciprofloxacin Hcl Nausea And Vomiting  . Codeine Hives and Nausea And Vomiting  . Sulfa Drugs Cross Reactors Hives and Nausea And Vomiting   PMHx:   Past Medical History  Diagnosis Date  . Syncope and collapse     Negative loop recorder in the past  . Hypertension   . AV block, 1st degree   . CVD (cerebrovascular disease)     History of CVA  . Atrial fibrillation     not a candidate for coumadin due to falls  . Pneumonia 09/11  . Traumatic enucleation of eye     left eye  . History of echocardiogram 9/11    normal EF, mild to mod MR & TR, mod pulmonary HTN  . Prediabetes   . Hypothyroidism   . GERD (gastroesophageal reflux disease)   . Osteoarthritis    Immunization History  Administered Date(s) Administered  . Influenza Split 02/03/2013  . Influenza, High Dose Seasonal PF 02/22/2014  . Pneumococcal Conjugate-13 04/11/2014  . Pneumococcal-Unspecified 05/26/2009  . Td 03/23/2013  . Zoster 05/26/2005   Past Surgical History  Procedure Laterality Date  . Back surgery    .  Inguinal hernia repair      left sided   FHx:    Reviewed / unchanged  SHx:    Reviewed / unchanged  Systems Review:  Constitutional: Denies fever, chills, wt changes, headaches, insomnia, fatigue, night sweats, change in appetite. Eyes: Denies redness, blurred vision, diplopia, discharge, itchy, watery eyes.  ENT: Denies discharge, congestion, post nasal drip, epistaxis, sore throat, earache, hearing loss, dental pain, tinnitus, vertigo, sinus pain, snoring.  CV: Denies chest pain, palpitations, irregular heartbeat, syncope, dyspnea, diaphoresis, orthopnea, PND, claudication or edema. Respiratory: denies cough, dyspnea, DOE, pleurisy, hoarseness, laryngitis, wheezing.  Gastrointestinal: Denies  dysphagia, odynophagia, heartburn, reflux, water brash, abdominal pain or cramps, nausea, vomiting, bloating, diarrhea, constipation, hematemesis, melena, hematochezia  or hemorrhoids. Genitourinary: Denies dysuria, frequency, urgency, nocturia, hesitancy, discharge, hematuria or flank pain. Musculoskeletal: Denies arthralgias, myalgias, stiffness, jt. swelling, pain, limping or strain/sprain.  Skin: Denies pruritus, rash, hives, warts, acne, eczema or change in skin lesion(s). Neuro: No weakness, tremor, incoordination, spasms, paresthesia or pain. Psychiatric: Denies confusion, memory loss or sensory loss. Endo: Denies change in weight, skin or hair change.  Heme/Lymph: No excessive bleeding, bruising or enlarged lymph nodes.  Physical Exam  BP 122/70 mmHg  Pulse 72  Temp(Src) 97.5 F (36.4 C)  Resp 16  Ht 5\' 2"  (1.575 m)  Wt 111 lb 3.2 oz (50.44 kg)  BMI 20.33 kg/m2  Appears well nourished and in no distress.  Eyes: Left eye prosthesis. Rt eye shows sclera / conjunctiva clear with rxtive pupil.  Sinuses: No frontal/maxillary tenderness ENT/Mouth: EAC's clear, TM's nl w/o erythema, bulging. Nares clear w/o erythema, swelling, exudates. Oropharynx clear without erythema or exudates. Oral hygiene is good. Tongue normal, non obstructing. Hearing intact.  Neck: Supple. Thyroid nl. Car 2+/2+ without bruits, nodes or JVD. Chest: Respirations nl with BS clear & equal w/o rales, rhonchi, wheezing or stridor.  Cor: Heart sounds normal w/ regular rate and rhythm without sig. murmurs, gallops, clicks, or rubs. Peripheral pulses normal and equal  without edema.  Abdomen: Soft & bowel sounds normal. Non-tender w/o guarding, rebound, hernias, masses, or organomegaly.  Lymphatics: Unremarkable.  Musculoskeletal: Full ROM all peripheral extremities, joint stability, 5/5 strength, and normal gait.  Skin: Warm, dry without exposed rashes, lesions or ecchymosis apparent.  Neuro: Cranial nerves intact,  reflexes equal bilaterally. Sensory-motor testing grossly intact. Tendon reflexes grossly intact.  Pysch: Alert & oriented x 3.  Insight and judgement nl & appropriate. No ideations.  Assessment and Plan:  1. Essential hypertension   2. Hyperlipidemia   3. Prediabetes  - Hemoglobin A1c - Insulin, random  4. Vitamin D deficiency  - Vit D  25 hydroxy   5. Hypothyroidism   6. Medication management  - CBC with Differential/Platelet - BASIC METABOLIC PANEL WITH GFR - Hepatic function panel - Magnesium - TSH  7. Need for prophylactic vaccination and inoculation against influenza  - Flu vaccine HIGH DOSE PF (Fluzone High dose)   Recommended regular exercise, BP monitoring, weight control, and discussed med and SE's. Recommended labs to assess and monitor clinical status. Further disposition pending results of labs. Over 30 minutes of exam, counseling, chart review was performed. To return Dec for CPE w/ Quentin Mulling, PA-C

## 2015-02-07 NOTE — Patient Instructions (Signed)

## 2015-02-08 ENCOUNTER — Encounter: Payer: Self-pay | Admitting: Internal Medicine

## 2015-02-08 LAB — HEMOGLOBIN A1C
Hgb A1c MFr Bld: 5.7 % — ABNORMAL HIGH (ref <5.7)
MEAN PLASMA GLUCOSE: 117 mg/dL — AB (ref <117)

## 2015-02-08 LAB — CBC WITH DIFFERENTIAL/PLATELET
BASOS PCT: 1 % (ref 0–1)
Basophils Absolute: 0.1 10*3/uL (ref 0.0–0.1)
Eosinophils Absolute: 0.8 10*3/uL — ABNORMAL HIGH (ref 0.0–0.7)
Eosinophils Relative: 9 % — ABNORMAL HIGH (ref 0–5)
HCT: 36.8 % (ref 36.0–46.0)
Hemoglobin: 12.5 g/dL (ref 12.0–15.0)
LYMPHS ABS: 2.1 10*3/uL (ref 0.7–4.0)
Lymphocytes Relative: 24 % (ref 12–46)
MCH: 31.7 pg (ref 26.0–34.0)
MCHC: 34 g/dL (ref 30.0–36.0)
MCV: 93.4 fL (ref 78.0–100.0)
MONOS PCT: 16 % — AB (ref 3–12)
MPV: 10 fL (ref 8.6–12.4)
Monocytes Absolute: 1.4 10*3/uL — ABNORMAL HIGH (ref 0.1–1.0)
NEUTROS PCT: 50 % (ref 43–77)
Neutro Abs: 4.3 10*3/uL (ref 1.7–7.7)
Platelets: 287 10*3/uL (ref 150–400)
RBC: 3.94 MIL/uL (ref 3.87–5.11)
RDW: 13.2 % (ref 11.5–15.5)
WBC: 8.6 10*3/uL (ref 4.0–10.5)

## 2015-02-08 LAB — BASIC METABOLIC PANEL WITH GFR
BUN: 28 mg/dL — ABNORMAL HIGH (ref 7–25)
CHLORIDE: 102 mmol/L (ref 98–110)
CO2: 26 mmol/L (ref 20–31)
CREATININE: 1.05 mg/dL — AB (ref 0.60–0.88)
Calcium: 9.7 mg/dL (ref 8.6–10.4)
GFR, Est African American: 51 mL/min — ABNORMAL LOW (ref >=60)
GFR, Est Non African American: 45 mL/min — ABNORMAL LOW (ref >=60)
Glucose, Bld: 101 mg/dL — ABNORMAL HIGH (ref 65–99)
POTASSIUM: 4.9 mmol/L (ref 3.5–5.3)
SODIUM: 138 mmol/L (ref 135–146)

## 2015-02-08 LAB — HEPATIC FUNCTION PANEL
ALK PHOS: 55 U/L (ref 33–130)
ALT: 19 U/L (ref 6–29)
AST: 28 U/L (ref 10–35)
Albumin: 4.3 g/dL (ref 3.6–5.1)
BILIRUBIN DIRECT: 0.1 mg/dL (ref <=0.2)
BILIRUBIN INDIRECT: 0.5 mg/dL (ref 0.2–1.2)
Total Bilirubin: 0.6 mg/dL (ref 0.2–1.2)
Total Protein: 6.8 g/dL (ref 6.1–8.1)

## 2015-02-08 LAB — VITAMIN D 25 HYDROXY (VIT D DEFICIENCY, FRACTURES): Vit D, 25-Hydroxy: 46 ng/mL (ref 30–100)

## 2015-02-08 LAB — TSH: TSH: 1.378 u[IU]/mL (ref 0.350–4.500)

## 2015-02-08 LAB — MAGNESIUM: MAGNESIUM: 2.1 mg/dL (ref 1.5–2.5)

## 2015-02-08 LAB — INSULIN, RANDOM: Insulin: 11.5 u[IU]/mL (ref 2.0–19.6)

## 2015-03-07 ENCOUNTER — Other Ambulatory Visit: Payer: Self-pay | Admitting: Internal Medicine

## 2015-03-15 ENCOUNTER — Encounter: Payer: Self-pay | Admitting: Internal Medicine

## 2015-03-15 ENCOUNTER — Ambulatory Visit (INDEPENDENT_AMBULATORY_CARE_PROVIDER_SITE_OTHER): Payer: Medicare Other | Admitting: Internal Medicine

## 2015-03-15 VITALS — BP 140/70 | HR 65 | Temp 98.1°F | Resp 14 | Ht 62.0 in | Wt 110.4 lb

## 2015-03-15 DIAGNOSIS — T148XXA Other injury of unspecified body region, initial encounter: Secondary | ICD-10-CM

## 2015-03-15 DIAGNOSIS — M79605 Pain in left leg: Secondary | ICD-10-CM

## 2015-03-15 NOTE — Patient Instructions (Signed)
Please place heat on the area of bruising and gently massage the area to encourage blood to return to the circulatory system.  Please talk to [name] when you see her next week about the venous stasis disease.  You can take up to two tramadol at bedtime if needed to help with some of the pain.

## 2015-03-15 NOTE — Progress Notes (Signed)
   Subjective:    Patient ID: [name], female    DOB: 06/10/1914, 81 y.o.   MRN: [id number]  HPI  Patient presents to the office for evaluation of left leg bruising.  She reports that she didn't hit her leg on anything that she was aware of.  It is slightly painful.  1 tablet of tramadol doesn't help.  She is afraid that this is due to her bad veins.  She has been wearing her compression stockings.    Review of Systems  Constitutional: Negative for fever, chills and fatigue.  Respiratory: Negative for cough, chest tightness, shortness of breath and wheezing.   Cardiovascular: Positive for leg swelling.  Skin: Positive for color change.       Objective:   Physical Exam  Constitutional: She appears well-developed and well-nourished. No distress.  HENT:  Head: Normocephalic.  Mouth/Throat: Oropharynx is clear and moist. No oropharyngeal exudate.  Eyes: Conjunctivae are normal. No scleral icterus.  Neck: Normal range of motion. Neck supple. No JVD present. No thyromegaly present.  Cardiovascular: Normal rate, regular rhythm, normal heart sounds and intact distal pulses.  Exam reveals no gallop and no friction rub.   No murmur heard. Pulmonary/Chest: Effort normal and breath sounds normal. No respiratory distress. She has no wheezes. She has no rales. She exhibits no tenderness.  Lymphadenopathy:    She has no cervical adenopathy.  Skin: Skin is warm and dry. She is not diaphoretic.  Left leg with bruising and with visible varicose veins.  There is considerable staining to the legs consistent with chronic venous stasis disease.   No musculoskeletal tenderness to palpation or deformities noted.  Full passive rom that is painfree.    Psychiatric: She has a normal mood and affect. Her behavior is normal. Judgment and thought content normal.  Nursing note and vitals reviewed.   Filed Vitals:   03/15/15 1428  BP: 140/70  Pulse: 65  Temp: 98.1 F (36.7 C)  Resp: 14         Assessment & Plan:    1. Hematoma -bruising vs. varicose vein leakage. Recommend continued use of compression stockings and also the use of warm compresses and elevation.  -discussed that pain is likely secondary to chronic venous stasis disease and that they likely would not do anything at a vascularl office.    2. Left leg pain -increase to up to 100mg  of tramadol prn for pain.

## 2015-03-21 ENCOUNTER — Encounter: Payer: Self-pay | Admitting: Nurse Practitioner

## 2015-03-21 ENCOUNTER — Ambulatory Visit (INDEPENDENT_AMBULATORY_CARE_PROVIDER_SITE_OTHER): Payer: Medicare Other | Admitting: Nurse Practitioner

## 2015-03-21 VITALS — BP 170/90 | HR 78 | Ht 62.4 in | Wt 110.8 lb

## 2015-03-21 DIAGNOSIS — W19XXXD Unspecified fall, subsequent encounter: Secondary | ICD-10-CM

## 2015-03-21 DIAGNOSIS — I480 Paroxysmal atrial fibrillation: Secondary | ICD-10-CM

## 2015-03-21 DIAGNOSIS — I10 Essential (primary) hypertension: Secondary | ICD-10-CM | POA: Diagnosis not present

## 2015-03-21 NOTE — Patient Instructions (Addendum)
We will be checking the following labs today - NONE   Medication Instructions:    Continue with your current medicines.     Testing/Procedures To Be Arranged:  N/A  Follow-Up:   See me in 6 months.    Other Special Instructions:   N/A    If you need a refill on your cardiac medications before your next appointment, please call your pharmacy.   Call the [facility] office at [phone] if you have any questions, problems or concerns.      

## 2015-03-21 NOTE — Progress Notes (Signed)
CARDIOLOGY OFFICE NOTE  Date:  03/21/2015    [name] Date of Birth: 07/14/1914 Medical Record #[id number]  PCP:  Nadean CorwinMCKEOWN,WILLIAM DAVID, MD  Cardiologist:  Nahser/Cherly Erno    Chief Complaint  Patient presents with  . Atrial Fibrillation    6 month check - seen for Dr. Elease HashimotoNahser  . Hypertension    History of Present Illness: Anne Lowerslma M Newbrough is a 81 y.o. female who presents today for a 6 month check. Seen for Dr. Elease HashimotoNahser. Seen for Dr. Elease HashimotoNahser. Former patient of Dr. Ronnald Nianennant's. Has a remote history of syncope with a past loop record implant that turned out to be unremarkable. This was many years ago. Other issues include PAF, first degree AV block, HTN, cerebrovascular disease, OA, hypothyroidism and advanced age.   Was admitted to the hospital in December 2013 with a recurrent syncopal spell. She had been cooking in the kitchen and got weak and went to sit down. She lost consciousness and was out for about 30 to 60 seconds, then regained consciousness. No seizure, HA, weakness, etc. EMS was called. BP was low (60's/40's). She had a sinus pause on EKG of about 2.5 seconds (daughter has brought in strips for review). She had been fairly active and had done 3 loads of laundry that day. Dr. Elease HashimotoNahser saw her and felt that this was orthostasis/dehydation. Her lasix was stopped. Her beta blocker was stopped. BP is to be more liberal and we are not to achieve aggressive control. She was given some IV fluids. Her discharge summary mentions atrial fib during that time but all of her EKGs that I see show sinus rhythm. His suggestion was to not be aggressive with her blood pressure control and to use support stockings.   Seen back in October of 2014. Was doing ok for the most part. Had been put back on her low dose beta blocker (atenolol 12.5 mg) due to having headaches and I stopped it - I felt her headaches were more related to arthritis. Called in early January with elevated BP - added back 10 mg of  Lasix just if systolic above 175.   2 prior ER visits back in June of 2015 - BP sky high. Low dose ACE started. Then having some left hand weakness/numbness. Sent back to the ER due to concerns for a stroke - BP running low and I cut the ACE back.  I last saw her in April and she seemed to be holding her own.   Comes in today. Here with her son & daughter today. Continues to be limited by back and leg pain. She has had recent labs which look ok for her. BP doing well at home. No falls. Still doing all the laundry/ironing on Monday. Cooking all meals. Has discoloration of her legs. Left leg with brusing noted - lots of varicosities and she is wearing support stockings. She has seen a "vein doctor" in the past - not really interested in going back and I agree with her. Would favor a more conservative approach.   Past Medical History  Diagnosis Date  . Syncope and collapse     Negative loop recorder in the past  . Hypertension   . AV block, 1st degree   . CVD (cerebrovascular disease)     History of CVA  . Atrial fibrillation (HCC)     not a candidate for coumadin due to falls  . Pneumonia 09/11  . Traumatic enucleation of eye     left  eye  . History of echocardiogram 9/11    normal EF, mild to mod MR & TR, mod pulmonary HTN  . Prediabetes   . Hypothyroidism   . GERD (gastroesophageal reflux disease)   . Osteoarthritis     Past Surgical History  Procedure Laterality Date  . Back surgery    . Inguinal hernia repair      left sided     Medications: Current Outpatient Prescriptions  Medication Sig Dispense Refill  . acetaminophen (TYLENOL) 325 MG tablet Take 325 mg by mouth every 6 (six) hours as needed for headache.    Marland Kitchen aspirin 81 MG tablet Take 81 mg by mouth at bedtime.     . beta carotene w/minerals (OCUVITE) tablet Take 1 tablet by mouth 2 (two) times daily.    . Cholecalciferol (VITAMIN D3) 1000 UNITS CAPS Take 1,000 Units by mouth 2 (two) times daily.     . Flaxseed,  Linseed, (FLAX SEED OIL PO) Take 1 tablet by mouth 2 (two) times daily.     . hydrochlorothiazide (HYDRODIURIL) 25 MG tablet Take 1 tablet (25 mg total) by mouth daily. 90 tablet 1  . lisinopril (PRINIVIL,ZESTRIL) 5 MG tablet TAKE 1/2 TABLET BY MOUTH DAILY 30 tablet 6  . MAGNESIUM PO Take 1 tablet by mouth daily.    . ondansetron (ZOFRAN) 4 MG tablet Take 1 tablet (4 mg total) by mouth every 6 (six) hours. 12 tablet 0  . OVER THE COUNTER MEDICATION Aleve PRN for mild pain.    . promethazine (PHENERGAN) 25 MG tablet Take 1 tablet (25 mg total) by mouth every 6 (six) hours as needed for nausea or vomiting. 90 tablet 0  . SYNTHROID 75 MCG tablet TAKE 1 TABLET BY MOUTH DAILY BEFORE BREAKFAST 90 tablet 1  . traMADol (ULTRAM) 50 MG tablet TAKE 1 TABLET BY MOUTH 3-4 TIMES DAILY AS NEEDED FOR PAIN 90 tablet 0  . triamcinolone (KENALOG) 0.025 % cream Apply 1 application topically as needed (for rash).      No current facility-administered medications for this visit.    Allergies: Allergies  Allergen Reactions  . Augmentin [Amoxicillin-Pot Clavulanate] Diarrhea  . Ciprocin-Fluocin-Procin [Fluocinolone Acetonide] Nausea And Vomiting  . Ciprofloxacin Hcl Nausea And Vomiting  . Codeine Hives and Nausea And Vomiting  . Sulfa Drugs Cross Reactors Hives and Nausea And Vomiting    Social History: The patient  reports that she has never smoked. She does not have any smokeless tobacco history on file. She reports that she does not drink alcohol or use illicit drugs.   Family History: The patient's family history includes Cancer in her brother; Heart attack in her brother and father; Stroke in her brother.   Review of Systems: Please see the history of present illness.   Otherwise, the review of systems is positive for none.   All other systems are reviewed and negative.   Physical Exam: VS:  BP 170/90 mmHg  Pulse 78  Ht 5' 2.4" (1.585 m)  Wt 110 lb 12.8 oz (50.259 kg)  BMI 20.01 kg/m2  SpO2 97% .   BMI Body mass index is 20.01 kg/(m^2).  Wt Readings from Last 3 Encounters:  03/21/15 110 lb 12.8 oz (50.259 kg)  03/15/15 110 lb 6.4 oz (50.077 kg)  02/07/15 111 lb 3.2 oz (50.44 kg)   BP by me is 140/60  General: Pleasant. Elderly female. She looks younger than her stated age. She is hard of hearing. She is in no acute distress.  HEENT:  Normal. Neck: Supple, no JVD, carotid bruits, or masses noted.  Cardiac: Regular rate and rhythm. No murmurs, rubs, or gallops. No edema. Lots of varicosities in her legs. Brawny stasis changes noted.  Respiratory:  Lungs are clear to auscultation bilaterally with normal work of breathing.  GI: Soft and nontender.  MS: No deformity or atrophy. Gait and ROM intact. Skin: Warm and dry. Color is normal.  Neuro:  Strength and sensation are intact and no gross focal deficits noted.  Psych: Alert, appropriate and with normal affect.   LABORATORY DATA:  EKG:  EKG is not ordered today.  Lab Results  Component Value Date   WBC 8.6 02/07/2015   HGB 12.5 02/07/2015   HCT 36.8 02/07/2015   PLT 287 02/07/2015   GLUCOSE 101* 02/07/2015   CHOL 202* 11/01/2014   TRIG 100 11/01/2014   HDL 77 11/01/2014   LDLCALC 105* 11/01/2014   ALT 19 02/07/2015   AST 28 02/07/2015   NA 138 02/07/2015   K 4.9 02/07/2015   CL 102 02/07/2015   CREATININE 1.05* 02/07/2015   BUN 28* 02/07/2015   CO2 26 02/07/2015   TSH 1.378 02/07/2015   INR 0.99 11/09/2013   HGBA1C 5.7* 02/07/2015   MICROALBUR 0.4 03/23/2014    BNP (last 3 results) No results for input(s): BNP in the last 8760 hours.  ProBNP (last 3 results) No results for input(s): PROBNP in the last 8760 hours.   Other Studies Reviewed Today:  Echo Study Conclusions from 04/2012  - Left ventricle: The cavity size was normal. Wall thickness was normal. Systolic function was normal. The estimated ejection fraction was in the range of 60% to 65%. Features are consistent with a pseudonormal left  ventricular filling pattern, with concomitant abnormal relaxation and increased filling pressure (grade 2 diastolic dysfunction). - Mitral valve: Mild regurgitation. - Pulmonary arteries: Systolic pressure was moderately increased. PA peak pressure: 54mm Hg (S).  Assessment/Plan: 1. HTN - no change with her current regimen. Recheck by me looks ok. She is doing well and holding her own.   2. Syncope - not recurred.   3. Advanced age   52. PAF - not a candidate for anticoagulation given age and unsteadiness.   Current medicines are reviewed with the patient today.  The patient does not have concerns regarding medicines other than what has been noted above.  The following changes have been made:  See above.  Labs/ tests ordered today include:   No orders of the defined types were placed in this encounter.     Disposition:   FU with me in 6 months.   Patient is agreeable to this plan and will call if any problems develop in the interim.   Signed: Rosalio Macadamia, RN, ANP-C 03/21/2015 3:23 PM  [facility] [address] [place], [place]  [address] Phone: [phone] Fax: [phone]

## 2015-03-27 ENCOUNTER — Telehealth: Payer: Self-pay | Admitting: Nurse Practitioner

## 2015-03-27 ENCOUNTER — Encounter: Payer: Self-pay | Admitting: Physician Assistant

## 2015-03-27 NOTE — Telephone Encounter (Signed)
New message    Daughter calling wanted to ask [name] something on personal level.

## 2015-04-02 ENCOUNTER — Encounter: Payer: Self-pay | Admitting: Physician Assistant

## 2015-04-02 ENCOUNTER — Ambulatory Visit (INDEPENDENT_AMBULATORY_CARE_PROVIDER_SITE_OTHER): Payer: Medicare Other | Admitting: Physician Assistant

## 2015-04-02 VITALS — BP 140/70 | HR 71 | Temp 97.7°F | Resp 14 | Ht 62.4 in | Wt 109.0 lb

## 2015-04-02 DIAGNOSIS — R059 Cough, unspecified: Secondary | ICD-10-CM

## 2015-04-02 DIAGNOSIS — Z1389 Encounter for screening for other disorder: Secondary | ICD-10-CM

## 2015-04-02 DIAGNOSIS — R05 Cough: Secondary | ICD-10-CM

## 2015-04-02 DIAGNOSIS — Z1331 Encounter for screening for depression: Secondary | ICD-10-CM

## 2015-04-02 MED ORDER — AZITHROMYCIN 250 MG PO TABS: ORAL_TABLET | ORAL | Status: AC

## 2015-04-02 NOTE — Progress Notes (Signed)
Subjective:     Patient ID: [name], female   DOB: 10/12/1914, 81 y.o.   MRN: [id number]  HPI  81 y.o. WF with history of Afib, HTn, CVA presents with cough. Her daughter is present, states that she has had cough for several weeks. Has corocidin HBP, and mucinex, has been doing some better. She is spitting up some mucus. Stable weight.   Blood pressure 140/70, pulse 71, temperature 97.7 F (36.5 C), temperature source Temporal, resp. rate 14, height 5' 2.4" (1.585 m), weight 109 lb (49.442 kg), SpO2 97 %.   Wt Readings from Last 3 Encounters:  04/02/15 109 lb (49.442 kg)  03/21/15 110 lb 12.8 oz (50.259 kg)  03/15/15 110 lb 6.4 oz (50.077 kg)    Past Medical History  Diagnosis Date  . Syncope and collapse     Negative loop recorder in the past  . Hypertension   . AV block, 1st degree   . CVD (cerebrovascular disease)     History of CVA  . Atrial fibrillation (HCC)     not a candidate for coumadin due to falls  . Pneumonia 09/11  . Traumatic enucleation of eye     left eye  . History of echocardiogram 9/11    normal EF, mild to mod MR & TR, mod pulmonary HTN  . Prediabetes   . Hypothyroidism   . GERD (gastroesophageal reflux disease)   . Osteoarthritis    Current Outpatient Prescriptions on File Prior to Visit  Medication Sig Dispense Refill  . acetaminophen (TYLENOL) 325 MG tablet Take 325 mg by mouth every 6 (six) hours as needed for headache.    Marland Kitchen. aspirin 81 MG tablet Take 81 mg by mouth at bedtime.     . beta carotene w/minerals (OCUVITE) tablet Take 1 tablet by mouth 2 (two) times daily.    . Cholecalciferol (VITAMIN D3) 1000 UNITS CAPS Take 1,000 Units by mouth 2 (two) times daily.     . Flaxseed, Linseed, (FLAX SEED OIL PO) Take 1 tablet by mouth 2 (two) times daily.     . hydrochlorothiazide (HYDRODIURIL) 25 MG tablet Take 1 tablet (25 mg total) by mouth daily. 90 tablet 1  . lisinopril (PRINIVIL,ZESTRIL) 5 MG tablet TAKE 1/2 TABLET BY MOUTH DAILY 30 tablet 6   . MAGNESIUM PO Take 1 tablet by mouth daily.    . ondansetron (ZOFRAN) 4 MG tablet Take 1 tablet (4 mg total) by mouth every 6 (six) hours. 12 tablet 0  . OVER THE COUNTER MEDICATION Aleve PRN for mild pain.    . promethazine (PHENERGAN) 25 MG tablet Take 1 tablet (25 mg total) by mouth every 6 (six) hours as needed for nausea or vomiting. 90 tablet 0  . SYNTHROID 75 MCG tablet TAKE 1 TABLET BY MOUTH DAILY BEFORE BREAKFAST 90 tablet 1  . traMADol (ULTRAM) 50 MG tablet TAKE 1 TABLET BY MOUTH 3-4 TIMES DAILY AS NEEDED FOR PAIN 90 tablet 0  . triamcinolone (KENALOG) 0.025 % cream Apply 1 application topically as needed (for rash).      No current facility-administered medications on file prior to visit.   Review of Systems  Constitutional: Positive for fatigue. Negative for fever and chills.  HENT: Positive for congestion, rhinorrhea, sinus pressure and sore throat. Negative for dental problem, ear discharge, ear pain, nosebleeds, trouble swallowing and voice change.   Respiratory: Positive for cough, chest tightness, shortness of breath and wheezing.   Cardiovascular: Negative.   Gastrointestinal: Negative.  Genitourinary: Negative.   Musculoskeletal: Negative.   Neurological: Negative.        Objective:   Physical Exam  Constitutional: She is oriented to person, place, and time. She appears well-developed and well-nourished.  HENT:  Head: Normocephalic and atraumatic.  Right Ear: External ear normal.  Left Ear: External ear normal.  Nose: Nose normal.  Mouth/Throat: Oropharynx is clear and moist.  Eyes: Conjunctivae are normal. Pupils are equal, round, and reactive to light.  Neck: Normal range of motion. Neck supple.  Cardiovascular: Normal rate and regular rhythm.   Pulmonary/Chest: Effort normal. No respiratory distress. She has wheezes (LLL). She has no rales. She exhibits no tenderness.  Abdominal: Soft. Bowel sounds are normal.  Lymphadenopathy:    She has no cervical  adenopathy.  Neurological: She is alert and oriented to person, place, and time.  Skin: Skin is warm and dry.       Assessment:     Cough, LLL wheezing  Versus ACE cough Depression screen negative    Plan:     Zpak, If not better, CXR If worsening CP, SOB go to ER.  May need to take off ACE but at this time will treat BP

## 2015-04-02 NOTE — Patient Instructions (Signed)

## 2015-04-04 ENCOUNTER — Other Ambulatory Visit: Payer: Self-pay | Admitting: Physician Assistant

## 2015-04-04 MED ORDER — BENZONATATE 100 MG PO CAPS: 100.0000 mg | ORAL_CAPSULE | Freq: Four times a day (QID) | ORAL | Status: DC | PRN

## 2015-05-09 ENCOUNTER — Ambulatory Visit (INDEPENDENT_AMBULATORY_CARE_PROVIDER_SITE_OTHER): Payer: Medicare Other | Admitting: Physician Assistant

## 2015-05-09 ENCOUNTER — Encounter: Payer: Self-pay | Admitting: Physician Assistant

## 2015-05-09 VITALS — BP 122/80 | HR 80 | Temp 98.2°F | Resp 14 | Ht 62.0 in | Wt 107.0 lb

## 2015-05-09 DIAGNOSIS — I482 Chronic atrial fibrillation, unspecified: Secondary | ICD-10-CM

## 2015-05-09 DIAGNOSIS — R7303 Prediabetes: Secondary | ICD-10-CM

## 2015-05-09 DIAGNOSIS — Z0001 Encounter for general adult medical examination with abnormal findings: Secondary | ICD-10-CM | POA: Diagnosis not present

## 2015-05-09 DIAGNOSIS — I440 Atrioventricular block, first degree: Secondary | ICD-10-CM

## 2015-05-09 DIAGNOSIS — M1990 Unspecified osteoarthritis, unspecified site: Secondary | ICD-10-CM

## 2015-05-09 DIAGNOSIS — E782 Mixed hyperlipidemia: Secondary | ICD-10-CM

## 2015-05-09 DIAGNOSIS — Z79899 Other long term (current) drug therapy: Secondary | ICD-10-CM

## 2015-05-09 DIAGNOSIS — I679 Cerebrovascular disease, unspecified: Secondary | ICD-10-CM

## 2015-05-09 DIAGNOSIS — E039 Hypothyroidism, unspecified: Secondary | ICD-10-CM | POA: Diagnosis not present

## 2015-05-09 DIAGNOSIS — E559 Vitamin D deficiency, unspecified: Secondary | ICD-10-CM | POA: Diagnosis not present

## 2015-05-09 DIAGNOSIS — I10 Essential (primary) hypertension: Secondary | ICD-10-CM

## 2015-05-09 DIAGNOSIS — K219 Gastro-esophageal reflux disease without esophagitis: Secondary | ICD-10-CM

## 2015-05-09 DIAGNOSIS — R7309 Other abnormal glucose: Secondary | ICD-10-CM | POA: Diagnosis not present

## 2015-05-09 DIAGNOSIS — R6889 Other general symptoms and signs: Secondary | ICD-10-CM | POA: Diagnosis not present

## 2015-05-09 LAB — CBC WITH DIFFERENTIAL/PLATELET
BASOS ABS: 0.1 10*3/uL (ref 0.0–0.1)
Basophils Relative: 1 % (ref 0–1)
EOS ABS: 0.6 10*3/uL (ref 0.0–0.7)
EOS PCT: 8 % — AB (ref 0–5)
HCT: 39.2 % (ref 36.0–46.0)
Hemoglobin: 13 g/dL (ref 12.0–15.0)
LYMPHS ABS: 2.3 10*3/uL (ref 0.7–4.0)
Lymphocytes Relative: 32 % (ref 12–46)
MCH: 30.9 pg (ref 26.0–34.0)
MCHC: 33.2 g/dL (ref 30.0–36.0)
MCV: 93.1 fL (ref 78.0–100.0)
MPV: 9.8 fL (ref 8.6–12.4)
Monocytes Absolute: 0.9 10*3/uL (ref 0.1–1.0)
Monocytes Relative: 12 % (ref 3–12)
Neutro Abs: 3.3 10*3/uL (ref 1.7–7.7)
Neutrophils Relative %: 47 % (ref 43–77)
PLATELETS: 285 10*3/uL (ref 150–400)
RBC: 4.21 MIL/uL (ref 3.87–5.11)
RDW: 13.4 % (ref 11.5–15.5)
WBC: 7.1 10*3/uL (ref 4.0–10.5)

## 2015-05-09 LAB — TSH: TSH: 1.906 u[IU]/mL (ref 0.350–4.500)

## 2015-05-09 MED ORDER — SYNTHROID 75 MCG PO TABS: ORAL_TABLET | ORAL | Status: DC

## 2015-05-09 MED ORDER — TRAMADOL HCL 50 MG PO TABS: ORAL_TABLET | ORAL | Status: DC

## 2015-05-09 NOTE — Progress Notes (Signed)
Complete Physical  Assessment and Plan: 1. Chronic atrial fibrillation Continue cardio follow up, no coumadin due to fall risk/age  81. CVD (cerebrovascular disease) Control blood pressure, cholesterol, glucose within reason for age, patient and daughter understand increased risk of CVA  3. AV block, 1st degree monitor  4. Essential hypertension Monitor, will not have strict BP control due to fall risk, explained to patient and daughter.  - CBC with Differential  5. Gastroesophageal reflux disease without esophagitis Diet controlled  6. Hypothyroidism, unspecified hypothyroidism type - TSH  7. Prediabetes Discussed general issues about diabetes pathophysiology and management., Educational material distributed., Suggested low cholesterol diet., Encouraged aerobic exercise., Discussed foot care., Reminded to get yearly retinal exam.  8. Osteoarthritis, unspecified osteoarthritis type, unspecified site controlled  9. Hyperlipidemia - Lipid panel  10. Vomiting/Nausea Will continue zofran PRN, declines work up/reglan, will chew food, suggest soft diet/eating small frequent meals  11. Encounter for long-term (current) use of medications - Magnesium  12. Vitamin D deficiency - Vit D  25 hydroxy (rtn osteoporosis monitoring  Discussed med's effects and SE's. Screening labs and tests as requested with regular follow-up as recommended.  HPI  81 y.o. female  presents for a complete physical.   Her blood pressure has been controlled at home, emphasized we want a higher BP to avoid falls, she is not on a fluid pill, she is on lisinopril  1/2 in the AM and if it is still elevated at night will take other half, today their BP is BP: 122/80 mmHg She does not workout. She denies chest pain, shortness of breath, dizziness. No falls this year.  She is not on cholesterol medication and denies myalgias. Her cholesterol is at goal. The cholesterol last visit was:   Lab Results  Component  Value Date   CHOL 202* 11/01/2014   HDL 77 11/01/2014   LDLCALC 105* 11/01/2014   TRIG 100 11/01/2014   CHOLHDL 2.6 11/01/2014    She has been working on diet and exercise for prediabetes, and denies paresthesia of the feet, polydipsia, polyuria and visual disturbances. Last A1C in the office was:  Lab Results  Component Value Date   HGBA1C 5.7* 02/07/2015   Patient is on Vitamin D supplement.   Lab Results  Component Value Date   VD25OH 46 02/07/2015     She has Afib but not on coumadin due to fall risk. She follows with Dr. Patty Sermons.  She is on thyroid medication. Her medication was not changed last visit. Patient denies nervousness, palpitations and weight changes.  Lab Results  Component Value Date   TSH 1.378 02/07/2015  She is limited by her OA, takes tramadol every night and occ during the day.  Daughter is here with her, son normally takes care of her medications/lives with her but he is in the hospital at this time.  Daughter states that she has been having vomiting, occ after eating, undigested food, no diarrhea/constipation. Gets full very quickly, no AB pain.   Current Medications:  Current Outpatient Prescriptions on File Prior to Visit  Medication Sig Dispense Refill  . acetaminophen (TYLENOL) 325 MG tablet Take 325 mg by mouth every 6 (six) hours as needed for headache.    Marland Kitchen aspirin 81 MG tablet Take 81 mg by mouth at bedtime.     . beta carotene w/minerals (OCUVITE) tablet Take 1 tablet by mouth 2 (two) times daily.    . Cholecalciferol (VITAMIN D3) 1000 UNITS CAPS Take 1,000 Units by mouth 2 (two)  times daily.     . Flaxseed, Linseed, (FLAX SEED OIL PO) Take 1 tablet by mouth 2 (two) times daily.     . hydrochlorothiazide (HYDRODIURIL) 25 MG tablet Take 1 tablet (25 mg total) by mouth daily. 90 tablet 1  . lisinopril (PRINIVIL,ZESTRIL) 5 MG tablet TAKE 1/2 TABLET BY MOUTH DAILY 30 tablet 6  . MAGNESIUM PO Take 1 tablet by mouth daily.    . ondansetron (ZOFRAN) 4  MG tablet Take 1 tablet (4 mg total) by mouth every 6 (six) hours. 12 tablet 0  . OVER THE COUNTER MEDICATION Aleve PRN for mild pain.    . promethazine (PHENERGAN) 25 MG tablet Take 1 tablet (25 mg total) by mouth every 6 (six) hours as needed for nausea or vomiting. 90 tablet 0  . SYNTHROID 75 MCG tablet TAKE 1 TABLET BY MOUTH DAILY BEFORE BREAKFAST 90 tablet 1  . traMADol (ULTRAM) 50 MG tablet TAKE 1 TABLET BY MOUTH 3-4 TIMES DAILY AS NEEDED FOR PAIN 90 tablet 0  . triamcinolone (KENALOG) 0.025 % cream Apply 1 application topically as needed (for rash).      No current facility-administered medications on file prior to visit.   Health Maintenance:   Immunization History  Administered Date(s) Administered  . Influenza Split 02/03/2013  . Influenza, High Dose Seasonal PF 02/22/2014, 02/07/2015  . Pneumococcal Conjugate-13 04/11/2014  . Pneumococcal-Unspecified 05/26/2009  . Td 03/23/2013  . Zoster 05/26/2005   Last colonoscopy: 2009 will not have another  Last mammogram: declines  Last pap smear/pelvic exam: declines  DEXA:declines   Prior vaccinations:  TD or Tdap: 2014  Influenza: 2016 Pneumococcal: 2011  Prevnar: 2015 Shingles/Zostavax: 2007  Names of Other Physician/Practitioners you currently use:  1. [facility]- here for primary care  2. Dr. Hyacinth Meeker, eye doctor, last visit 05/2014 3. Dr. Matthias Hughs  4. Dr. Carney Harder but sees Rosalio Macadamia, NP  Allergies:  Allergies  Allergen Reactions  . Augmentin [Amoxicillin-Pot Clavulanate] Diarrhea  . Ciprocin-Fluocin-Procin [Fluocinolone Acetonide] Nausea And Vomiting  . Ciprofloxacin Hcl Nausea And Vomiting  . Codeine Hives and Nausea And Vomiting  . Sulfa Drugs Cross Reactors Hives and Nausea And Vomiting   Medical History:  Past Medical History  Diagnosis Date  . Syncope and collapse     Negative loop recorder in the past  . Hypertension   . AV block, 1st degree   . CVD  (cerebrovascular disease)     History of CVA  . Atrial fibrillation (HCC)     not a candidate for coumadin due to falls  . Pneumonia 09/11  . Traumatic enucleation of eye     left eye  . History of echocardiogram 9/11    normal EF, mild to mod MR & TR, mod pulmonary HTN  . Prediabetes   . Hypothyroidism   . GERD (gastroesophageal reflux disease)   . Osteoarthritis    Surgical History:  Past Surgical History  Procedure Laterality Date  . Back surgery    . Inguinal hernia repair      left sided   Family History:  Family History  Problem Relation Age of Onset  . Heart attack Father   . Heart attack Brother   . Cancer Brother   . Stroke Brother    Social History:  Social History  Substance Use Topics  . Smoking status: Never Smoker   . Smokeless tobacco: None  . Alcohol Use: No   Review of Systems  Constitutional: Negative for  fever, chills and malaise/fatigue.  HENT: Positive for hearing loss. Negative for congestion, ear discharge, ear pain, sore throat and tinnitus.   Respiratory: Negative for cough, shortness of breath, wheezing and stridor.   Cardiovascular: Negative for chest pain, palpitations and leg swelling.  Gastrointestinal: Positive for heartburn (occasionally) and nausea (with tramadol). Negative for vomiting, abdominal pain, diarrhea, constipation, blood in stool and melena.  Genitourinary: Negative.   Musculoskeletal: Positive for myalgias, back pain and joint pain. Negative for falls and neck pain.  Skin: Negative.   Neurological: Negative for sensory change, loss of consciousness and headaches.  Psychiatric/Behavioral: Negative for depression. The patient is not nervous/anxious and does not have insomnia.      Physical Exam: Estimated body mass index is 19.57 kg/(m^2) as calculated from the following:   Height as of this encounter: 5\' 2"  (1.575 m).   Weight as of this encounter: 107 lb (48.535 kg). BP 122/80 mmHg  Pulse 80  Temp(Src) 98.2 F (36.8  C) (Temporal)  Resp 14  Ht 5\' 2"  (1.575 m)  Wt 107 lb (48.535 kg)  BMI 19.57 kg/m2  SpO2 97% General Appearance: Decreased weight in no apparent distress.  Eyes: PERRLA, EOMs, conjunctiva no swelling or erythema, normal fundi and vessels.  Sinuses: No Frontal/maxillary tenderness  ENT/Mouth: Ext aud canals clear, normal light reflex with TMs without erythema, bulging.  No erythema, swelling, or exudate on post pharynx. Tonsils not swollen or erythematous. Hearing decreased  Neck: Supple, thyroid normal. No bruits  Respiratory: Respiratory effort normal, BS equal bilaterally without rales, rhonchi, wheezing or stridor.  Cardio: irreg irreg with 2/6 mumur, rubs or gallops. Brisk peripheral pulses without edema, wearing compression stockings.  Chest: symmetric, with normal excursions and percussion.  Breasts: defer Abdomen: Soft, nontender, no guarding, + right ventral hernia, without rebound, masses, or organomegaly.  Lymphatics: Non tender without lymphadenopathy.  Genitourinary: defer Musculoskeletal: Full ROM all peripheral extremities,4/5 strength, and gait unsteady, wide based Skin: Warm, dry without rashes, lesions, ecchymosis. Lipoma left flank.  Neuro: Cranial nerves intact, reflexes equal bilaterally. Normal muscle tone, no cerebellar symptoms.  Psych: Awake and oriented X 3, normal affect, Insight and Judgment appropriate.   EKG: defer   Quentin MullingAmanda Coren Crownover 3:02 PM [facility]

## 2015-05-09 NOTE — Patient Instructions (Addendum)
Gastroparesis Gastroparesis, also called delayed gastric emptying, is a condition in which food takes longer than normal to empty from the stomach. The condition is usually long-lasting (chronic). CAUSES This condition may be caused by:  An endocrine disorder, such as hypothyroidism or diabetes. Diabetes is the most common cause of this condition.  A nervous system disease, such as Parkinson disease or multiple sclerosis.  Cancer, infection, or surgery of the stomach or vagus nerve.  A connective tissue disorder, such as scleroderma.  Certain medicines. In most cases, the cause is not known. RISK FACTORS This condition is more likely to develop in:  People with certain disorders, including endocrine disorders, eating disorders, amyloidosis, and scleroderma.  People with certain diseases, including Parkinson disease or multiple sclerosis.  People with cancer or infection of the stomach or vagus nerve.  People who have had surgery on the stomach or vagus nerve.  People who take certain medicines.  Women. SYMPTOMS Symptoms of this condition include:  An early feeling of fullness when eating.  Nausea.  Weight loss.  Vomiting.  Heartburn.  Abdominal bloating.  Inconsistent blood glucose levels.  Lack of appetite.  Acid from the stomach coming up into the esophagus (gastroesophageal reflux).  Spasms of the stomach. Symptoms may come and go. DIAGNOSIS This condition is diagnosed with tests, such as:  Tests that check how long it takes food to move through the stomach and intestines. These tests include:  Upper gastrointestinal (GI) series. In this test, X-rays of the intestines are taken after you drink a liquid. The liquid makes the intestines show up better on the X-rays.  Gastric emptying scintigraphy. In this test, scans are taken after you eat food that contains a small amount of radioactive material.  Wireless capsule GI monitoring system. This test  involves swallowing a capsule that records information about movement through the stomach.  Gastric manometry. This test measures electrical and muscular activity in the stomach. It is done with a thin tube that is passed down the throat and into the stomach.  Endoscopy. This test checks for abnormalities in the lining of the stomach. It is done with a long, thin tube that is passed down the throat and into the stomach.  An ultrasound. This test can help rule out gallbladder disease or pancreatitis as a cause of your symptoms. It uses sound waves to take pictures of the inside of your body. TREATMENT There is no cure for gastroparesis. This condition may be managed with:  Treatment of the underlying condition causing the gastroparesis.  Lifestyle changes, including exercise and dietary changes. Dietary changes can include:  Changes in what and when you eat.  Eating smaller meals more often.  Eating low-fat foods.  Eating low-fiber forms of high-fiber foods, such as cooked vegetables instead of raw vegetables.  Having liquid foods in place of solid foods. Liquid foods are easier to digest.  Medicines. These may be given to control nausea and vomiting and to stimulate stomach muscles.  Getting food through a feeding tube. This may be done in severe cases.  A gastric neurostimulator. This is a device that is inserted into the body with surgery. It helps improve stomach emptying and control nausea and vomiting. HOME CARE INSTRUCTIONS  Follow your health care provider's instructions about exercise and diet.  Take medicines only as directed by your health care provider. SEEK MEDICAL CARE IF:  Your symptoms do not improve with treatment.  You have new symptoms. SEEK IMMEDIATE MEDICAL CARE IF:  You have  severe abdominal pain that does not improve with treatment.  You have nausea that does not go away.  You cannot keep fluids down.   This information is not intended to replace  advice given to you by your health care provider. Make sure you discuss any questions you have with your health care provider.   Document Released: 05/12/2005 Document Revised: 09/26/2014 Document Reviewed: 05/08/2014 Elsevier Interactive Patient Education 2016 [employer]  Can get synthroid direct is supported by [facility] [place], [place]. It is a way to get BRAND NAME synthroid sent directly to your house. I will send a prescription to a mail order pharmacy, you need to call the pharmacy at [phone] or go online to [email or web address] to get started.   Cost:  Pay 25$ for a one month supply Pay 45$ for a two month supply Pay 65$ for a 3 month supply and save 10$   Please monitor your blood pressure, as we get older our body can not respond to a low blood pressure as well as it did when we were younger, for this reason we want a bit higher of a blood pressure as you get older to avoid dizziness and fatigue which can lead to falls. Pease call if your blood pressure is consistently above 150/90.

## 2015-05-10 LAB — HEPATIC FUNCTION PANEL
ALBUMIN: 4.2 g/dL (ref 3.6–5.1)
ALK PHOS: 42 U/L (ref 33–130)
ALT: 20 U/L (ref 6–29)
AST: 34 U/L (ref 10–35)
BILIRUBIN INDIRECT: 0.6 mg/dL (ref 0.2–1.2)
Bilirubin, Direct: 0.2 mg/dL (ref <=0.2)
TOTAL PROTEIN: 6.7 g/dL (ref 6.1–8.1)
Total Bilirubin: 0.8 mg/dL (ref 0.2–1.2)

## 2015-05-10 LAB — URINALYSIS, ROUTINE W REFLEX MICROSCOPIC
Bilirubin Urine: NEGATIVE
Glucose, UA: NEGATIVE
Hgb urine dipstick: NEGATIVE
KETONES UR: NEGATIVE
LEUKOCYTES UA: NEGATIVE
Nitrite: NEGATIVE
PROTEIN: NEGATIVE
Specific Gravity, Urine: 1.009 (ref 1.001–1.035)
pH: 6.5 (ref 5.0–8.0)

## 2015-05-10 LAB — MAGNESIUM: MAGNESIUM: 2.1 mg/dL (ref 1.5–2.5)

## 2015-05-10 LAB — BASIC METABOLIC PANEL WITH GFR
BUN: 20 mg/dL (ref 7–25)
CALCIUM: 9.6 mg/dL (ref 8.6–10.4)
CO2: 27 mmol/L (ref 20–31)
Chloride: 100 mmol/L (ref 98–110)
Creat: 0.99 mg/dL — ABNORMAL HIGH (ref 0.60–0.88)
GFR, EST AFRICAN AMERICAN: 55 mL/min — AB (ref >=60)
GFR, Est Non African American: 48 mL/min — ABNORMAL LOW (ref >=60)
Glucose, Bld: 79 mg/dL (ref 65–99)
POTASSIUM: 4.5 mmol/L (ref 3.5–5.3)
Sodium: 137 mmol/L (ref 135–146)

## 2015-05-10 LAB — LIPID PANEL
Cholesterol: 193 mg/dL (ref 125–200)
HDL: 76 mg/dL (ref >=46)
LDL CALC: 103 mg/dL (ref <130)
TRIGLYCERIDES: 71 mg/dL (ref <150)
Total CHOL/HDL Ratio: 2.5 Ratio (ref <=5.0)
VLDL: 14 mg/dL (ref <30)

## 2015-05-10 LAB — HEMOGLOBIN A1C
HEMOGLOBIN A1C: 5.7 % — AB (ref <5.7)
Mean Plasma Glucose: 117 mg/dL — ABNORMAL HIGH (ref <117)

## 2015-05-10 LAB — MICROALBUMIN / CREATININE URINE RATIO
CREATININE, URINE: 34 mg/dL (ref 20–320)
MICROALB UR: 1.1 mg/dL
Microalb Creat Ratio: 32 mcg/mg creat — ABNORMAL HIGH (ref <30)

## 2015-05-10 LAB — VITAMIN D 25 HYDROXY (VIT D DEFICIENCY, FRACTURES): Vit D, 25-Hydroxy: 59 ng/mL (ref 30–100)

## 2015-05-10 NOTE — Addendum Note (Signed)
Addended by: Quentin MullingOLLIER, Rawlin Reaume R on: 05/10/2015 01:17 PM   Modules accepted: Kipp BroodSmartSet

## 2015-07-18 DIAGNOSIS — H353111 Nonexudative age-related macular degeneration, right eye, early dry stage: Secondary | ICD-10-CM | POA: Diagnosis not present

## 2015-07-18 DIAGNOSIS — H26491 Other secondary cataract, right eye: Secondary | ICD-10-CM | POA: Diagnosis not present

## 2015-07-25 ENCOUNTER — Telehealth: Payer: Self-pay | Admitting: Internal Medicine

## 2015-07-25 NOTE — Telephone Encounter (Signed)
Is mom a candidate for Pallative Care? If so, can she have a referral?  Patient's daughter left a voice mail asking.   Thanks, Katrina

## 2015-08-02 DIAGNOSIS — H903 Sensorineural hearing loss, bilateral: Secondary | ICD-10-CM | POA: Diagnosis not present

## 2015-08-07 DIAGNOSIS — H6123 Impacted cerumen, bilateral: Secondary | ICD-10-CM | POA: Diagnosis not present

## 2015-08-13 ENCOUNTER — Ambulatory Visit (INDEPENDENT_AMBULATORY_CARE_PROVIDER_SITE_OTHER): Payer: Medicare Other | Admitting: Internal Medicine

## 2015-08-13 ENCOUNTER — Encounter: Payer: Self-pay | Admitting: Internal Medicine

## 2015-08-13 VITALS — BP 138/70 | HR 76 | Temp 97.6°F | Resp 16 | Ht 62.0 in | Wt 110.0 lb

## 2015-08-13 DIAGNOSIS — I10 Essential (primary) hypertension: Secondary | ICD-10-CM | POA: Diagnosis not present

## 2015-08-13 DIAGNOSIS — E782 Mixed hyperlipidemia: Secondary | ICD-10-CM

## 2015-08-13 DIAGNOSIS — E039 Hypothyroidism, unspecified: Secondary | ICD-10-CM | POA: Diagnosis not present

## 2015-08-13 DIAGNOSIS — E559 Vitamin D deficiency, unspecified: Secondary | ICD-10-CM

## 2015-08-13 DIAGNOSIS — R7303 Prediabetes: Secondary | ICD-10-CM

## 2015-08-13 DIAGNOSIS — R7309 Other abnormal glucose: Secondary | ICD-10-CM | POA: Diagnosis not present

## 2015-08-13 DIAGNOSIS — Z79899 Other long term (current) drug therapy: Secondary | ICD-10-CM | POA: Diagnosis not present

## 2015-08-13 DIAGNOSIS — N390 Urinary tract infection, site not specified: Secondary | ICD-10-CM

## 2015-08-13 DIAGNOSIS — I4891 Unspecified atrial fibrillation: Secondary | ICD-10-CM

## 2015-08-13 LAB — CBC WITH DIFFERENTIAL/PLATELET
BASOS ABS: 0.1 10*3/uL (ref 0.0–0.1)
Basophils Relative: 2 % — ABNORMAL HIGH (ref 0–1)
EOS ABS: 0.5 10*3/uL (ref 0.0–0.7)
Eosinophils Relative: 7 % — ABNORMAL HIGH (ref 0–5)
HCT: 36.5 % (ref 36.0–46.0)
Hemoglobin: 12.3 g/dL (ref 12.0–15.0)
LYMPHS ABS: 1.4 10*3/uL (ref 0.7–4.0)
Lymphocytes Relative: 20 % (ref 12–46)
MCH: 31.5 pg (ref 26.0–34.0)
MCHC: 33.7 g/dL (ref 30.0–36.0)
MCV: 93.6 fL (ref 78.0–100.0)
MPV: 9.9 fL (ref 8.6–12.4)
Monocytes Absolute: 1.1 10*3/uL — ABNORMAL HIGH (ref 0.1–1.0)
Monocytes Relative: 16 % — ABNORMAL HIGH (ref 3–12)
NEUTROS ABS: 3.8 10*3/uL (ref 1.7–7.7)
NEUTROS PCT: 55 % (ref 43–77)
PLATELETS: 296 10*3/uL (ref 150–400)
RBC: 3.9 MIL/uL (ref 3.87–5.11)
RDW: 13 % (ref 11.5–15.5)
WBC: 6.9 10*3/uL (ref 4.0–10.5)

## 2015-08-13 LAB — BASIC METABOLIC PANEL WITH GFR
BUN: 21 mg/dL (ref 7–25)
CHLORIDE: 98 mmol/L (ref 98–110)
CO2: 28 mmol/L (ref 20–31)
Calcium: 9.9 mg/dL (ref 8.6–10.4)
Creat: 1.11 mg/dL — ABNORMAL HIGH (ref 0.60–0.88)
GFR, Est African American: 48 mL/min — ABNORMAL LOW (ref >=60)
GFR, Est Non African American: 41 mL/min — ABNORMAL LOW (ref >=60)
Glucose, Bld: 86 mg/dL (ref 65–99)
POTASSIUM: 4.7 mmol/L (ref 3.5–5.3)
SODIUM: 137 mmol/L (ref 135–146)

## 2015-08-13 LAB — HEPATIC FUNCTION PANEL
ALT: 24 U/L (ref 6–29)
AST: 29 U/L (ref 10–35)
Albumin: 4.2 g/dL (ref 3.6–5.1)
Alkaline Phosphatase: 53 U/L (ref 33–130)
BILIRUBIN DIRECT: 0.2 mg/dL (ref <=0.2)
BILIRUBIN INDIRECT: 0.5 mg/dL (ref 0.2–1.2)
BILIRUBIN TOTAL: 0.7 mg/dL (ref 0.2–1.2)
Total Protein: 6.7 g/dL (ref 6.1–8.1)

## 2015-08-13 LAB — HEMOGLOBIN A1C
HEMOGLOBIN A1C: 5.6 % (ref <5.7)
Mean Plasma Glucose: 114 mg/dL (ref <117)

## 2015-08-13 LAB — LIPID PANEL
CHOL/HDL RATIO: 2.3 ratio (ref <=5.0)
CHOLESTEROL: 197 mg/dL (ref 125–200)
HDL: 87 mg/dL (ref >=46)
LDL Cholesterol: 96 mg/dL (ref <130)
Triglycerides: 70 mg/dL (ref <150)
VLDL: 14 mg/dL (ref <30)

## 2015-08-13 LAB — MAGNESIUM: MAGNESIUM: 1.7 mg/dL (ref 1.5–2.5)

## 2015-08-13 LAB — TSH: TSH: 3.02 mIU/L

## 2015-08-13 NOTE — Progress Notes (Signed)
Patient ID: [name], female   DOB: 08/18/1914, 82 y.o.   MRN: [id number]   This very nice spry  82 y.o. WWF presents for  follow up with Hypertension, Hyperlipidemia, Pre-Diabetes and Vitamin D Deficiency. Patient is c/o burning of her leg below the knees precluding sleep at night.    Patient is treated for HTN & BP has been controlled at home. Today's BP: 138/70 mmHg. Patient has hx/o CVA & pAfib in 2004 and is not on anti-coagulants due to high fall risk. Patient has had no complaints of any cardiac type chest pain, palpitations, dyspnea/orthopnea/PND, dizziness, claudication, or dependent edema.   Hyperlipidemia is controlled with diet & meds. Patient denies myalgias or other med SE's. Last Lipids were 05/09/2015: Cholesterol 193; HDL 76; LDL Cholesterol 103; Triglycerides 71 on    Also, the patient has history of PreDiabetes and has had no symptoms of reactive hypoglycemia, diabetic polys, paresthesias or visual blurring.  Last A1c was  5.7% on 05/09/2015.    Further, the patient also has history of Vitamin D Deficiency and supplements vitamin D without any suspected side-effects. Last vitamin D was 59 on 05/09/2015.   Medication Sig  . acetaminophen  325 MG tablet Take 325 mg by mouth every 6 (six) hours as needed for headache.  Marland Kitchen aspirin 81 MG tablet Take 81 mg by mouth at bedtime.   Idolina Primer Take 1 tablet by mouth 2 (two) times daily.  Marland Kitchen VITAMIN D 1000 UNITS  Take 1,000 Units by mouth 2 (two) times daily.   Marland Kitchen FLAX SEED OIL  Take 1 tablet by mouth 2 (two) times daily.   Marland Kitchen lisinopril  5 MG tablet TAKE 1/2 TABLET BY MOUTH DAILY  . MAGNESIUM PO Take 1 tablet by mouth daily.  . ondansetron  4 MG tablet Take 1 tablet (4 mg total) by mouth every 6 (six) hours.  . Aleve  PRN for mild pain.  . promethazine  25 MG tablet Take 1 tablet (25 mg total) by mouth every 6 (six) hours as needed for nausea or vomiting.  Marland Kitchen SYNTHROID 75 MCG tablet TAKE 1 TABLET BY MOUTH DAILY BEFORE BREAKFAST  .  traMADol  50 MG tablet TAKE 1 TABLET BY MOUTH 3-4 TIMES DAILY AS NEEDED FOR PAIN  . triamcinolone  0.025 % cream Apply 1 application topically as needed (for rash).    Allergies  Allergen Reactions  . Augmentin [Amoxicillin-Pot Clavulanate] Diarrhea  . Ciprocin-Fluocin-Procin [Fluocinolone Acetonide] Nausea And Vomiting  . Ciprofloxacin Hcl Nausea And Vomiting  . Codeine Hives and Nausea And Vomiting  . Sulfa Drugs Cross Reactors Hives and Nausea And Vomiting   PMHx:   Past Medical History  Diagnosis Date  . Syncope and collapse     Negative loop recorder in the past  . Hypertension   . AV block, 1st degree   . CVD (cerebrovascular disease)     History of CVA  . Atrial fibrillation (HCC)     not a candidate for coumadin due to falls  . Pneumonia 09/11  . Traumatic enucleation of eye     left eye  . History of echocardiogram 9/11    normal EF, mild to mod MR & TR, mod pulmonary HTN  . Prediabetes   . Hypothyroidism   . GERD (gastroesophageal reflux disease)   . Osteoarthritis    Immunization History  Administered Date(s) Administered  . Influenza Split 02/03/2013  . Influenza, High Dose Seasonal PF 02/22/2014, 02/07/2015  . Pneumococcal Conjugate-13 04/11/2014  .  Pneumococcal-Unspecified 05/26/2009  . Td 03/23/2013  . Zoster 05/26/2005   Past Surgical History  Procedure Laterality Date  . Back surgery    . Inguinal hernia repair      left sided   FHx:    Reviewed / unchanged  SHx:    Reviewed / unchanged  Systems Review:  Constitutional: Denies fever, chills, wt changes, headaches, insomnia, fatigue, night sweats, change in appetite. Eyes: Denies redness, blurred vision, diplopia, discharge, itchy, watery eyes.  ENT: Denies discharge, congestion, post nasal drip, epistaxis, sore throat, earache, hearing loss, dental pain, tinnitus, vertigo, sinus pain, snoring.  CV: Denies chest pain, palpitations, irregular heartbeat, syncope, dyspnea, diaphoresis, orthopnea,  PND, claudication or edema. Respiratory: denies cough, dyspnea, DOE, pleurisy, hoarseness, laryngitis, wheezing.  Gastrointestinal: Denies dysphagia, odynophagia, heartburn, reflux, water brash, abdominal pain or cramps, nausea, vomiting, bloating, diarrhea, constipation, hematemesis, melena, hematochezia  or hemorrhoids. Genitourinary: Denies  frequency, urgency, nocturia, hesitancy, discharge, hematuria or flank pain. C/o dysuria. Musculoskeletal: Denies arthralgias, myalgias, stiffness, jt. swelling, pain, limping or strain/sprain.  Skin: Denies pruritus, rash, hives, warts, acne, eczema or change in skin lesion(s). Neuro: No weakness, tremor, incoordination, spasms, paresthesia or pain. Psychiatric: Denies confusion, memory loss or sensory loss. Endo: Denies change in weight, skin or hair change.  Heme/Lymph: No excessive bleeding, bruising or enlarged lymph nodes.  Physical Exam  BP 138/70 mmHg  Pulse 76  Temp(Src) 97.6 F (36.4 C)  Resp 16  Ht 5\' 2"  (1.575 m)  Wt 110 lb (49.896 kg)  BMI 20.11 kg/m2  Appears well nourished and in no distress.  Eyes: PERRLA, EOMs, conjunctiva no swelling or erythema. Sinuses: No frontal/maxillary tenderness ENT/Mouth: EAC's clear, TM's nl w/o erythema, bulging. Nares clear w/o erythema, swelling, exudates. Oropharynx clear without erythema or exudates. Oral hygiene is good. Tongue normal, non obstructing. Hearing intact.  Neck: Supple. Thyroid nl. Car 2+/2+ without bruits, nodes or JVD. Chest: Respirations nl with BS clear & equal w/o rales, rhonchi, wheezing or stridor.  Cor: Heart sounds normal w/ regular rate and rhythm without sig. murmurs, gallops, clicks, or rubs. Peripheral pulses normal and equal  without edema.  Abdomen: Soft & bowel sounds normal. Non-tender w/o guarding, rebound, hernias, masses, or organomegaly.  Lymphatics: Unremarkable.  Musculoskeletal: Full ROM all peripheral extremities, joint stability. Generalized decrease in  muscle power, tone & bulk consistent with age and deconditioning.  Normal gait.  Skin: Warm, dry without exposed rashes, lesions or ecchymosis apparent.  Neuro: Cranial nerves intact. Motor testing grossly intact. DTR's 1+ in UE's and absent in LE's and decreased sensation in LE's in a stocking distribution.  Pysch: Alert & oriented x 3.  Insight and judgement nl & appropriate. No ideations.  Assessment and Plan  1. Essential hypertension  - TSH  2. Hyperlipidemia  - Lipid panel - TSH  3. Prediabetes  - Hemoglobin A1c - Insulin, random  4. Vitamin D deficiency  - VITAMIN D 25 Hydroxy   5. Hypothyroidism, unspecified hypothyroidism type   6. Atrial fibrillation,    7. Urinary tract infection  - Urinalysis, Routine w reflex microscopic  - Urine culture  8. Medication management  - CBC with Differential/Platelet - BASIC METABOLIC PANEL WITH GFR - Hepatic function panel - Magnesium  9. Peripheral Neuropathy   - Sx Lyrica 50 mg # 43 cap  - instructed daughter to try 1-2 qhs.    Recommended regular exercise, BP monitoring, weight control, and discussed med and SE's. Recommended labs to assess and monitor clinical status. Further  disposition pending results of labs. Over 30 minutes of exam, counseling, chart review was performed

## 2015-08-13 NOTE — Patient Instructions (Signed)

## 2015-08-14 LAB — URINALYSIS, ROUTINE W REFLEX MICROSCOPIC
BILIRUBIN URINE: NEGATIVE
Glucose, UA: NEGATIVE
Hgb urine dipstick: NEGATIVE
Ketones, ur: NEGATIVE
LEUKOCYTES UA: NEGATIVE
Nitrite: NEGATIVE
PROTEIN: NEGATIVE
SPECIFIC GRAVITY, URINE: 1.007 (ref 1.001–1.035)
pH: 6.5 (ref 5.0–8.0)

## 2015-08-14 LAB — URINE CULTURE: Colony Count: 5000

## 2015-08-14 LAB — VITAMIN D 25 HYDROXY (VIT D DEFICIENCY, FRACTURES): VIT D 25 HYDROXY: 46 ng/mL (ref 30–100)

## 2015-08-14 LAB — INSULIN, RANDOM: Insulin: 6.7 u[IU]/mL (ref 2.0–19.6)

## 2015-08-15 ENCOUNTER — Telehealth: Payer: Self-pay | Admitting: Nurse Practitioner

## 2015-08-15 ENCOUNTER — Other Ambulatory Visit: Payer: Self-pay | Admitting: *Deleted

## 2015-08-15 NOTE — Telephone Encounter (Signed)
Daughter (Ms. Bascom LevelsFrazier) calling (given permission to speak to her by pt) stating when she got up this morning she felt light headed and dizzy. She took her Lisinopril 5 mg 1/2 tablet.  At 12:30 she took her BP and was 205/112 so she took another 1/2 tablet of the Lisinopril.  At 1:10 BP was 177/87; at 1:35 was 160/83 and at 2:00 was 163/79.  She is wanting to know if she should give her another 1/2 tablet of Lisinopril.  When asked what her HR was she "thinks" it is 75. She states that her mother states that she is not light headed now.  States she is not use to reading BP machine.  Asked her to feel pulse to see if fast or irregular.  States feels regular and not "really" fast.  Spoke w/[name] Gerhardt,NP who states she shouldn't take another Lisinopril.  She is comfortable with her BP being in the 160/ range.  Notified daughter.  She also states that her PCP started her on Lyrica 50 mg on Monday for her restless leg. Will continue to monitor BP and advise us if BP becomes elevated again and if she has the lightheadedness.

## 2015-08-15 NOTE — Telephone Encounter (Signed)
New message   Pt daughter is calling because she wants to give her mother a double dose of medication  Lisinopril 5mg   She has had 10 mg so far but she wants to see if she can give her more her bp keeps rising and dropping   177/87 curently

## 2015-08-22 ENCOUNTER — Ambulatory Visit (INDEPENDENT_AMBULATORY_CARE_PROVIDER_SITE_OTHER): Payer: Medicare Other | Admitting: Nurse Practitioner

## 2015-08-22 ENCOUNTER — Telehealth: Payer: Self-pay | Admitting: *Deleted

## 2015-08-22 ENCOUNTER — Encounter: Payer: Self-pay | Admitting: Nurse Practitioner

## 2015-08-22 VITALS — BP 178/80 | HR 70 | Ht 62.0 in | Wt 110.8 lb

## 2015-08-22 DIAGNOSIS — I480 Paroxysmal atrial fibrillation: Secondary | ICD-10-CM | POA: Diagnosis not present

## 2015-08-22 DIAGNOSIS — I10 Essential (primary) hypertension: Secondary | ICD-10-CM

## 2015-08-22 LAB — BASIC METABOLIC PANEL
BUN: 32 mg/dL — ABNORMAL HIGH (ref 7–25)
CO2: 25 mmol/L (ref 20–31)
Calcium: 9.3 mg/dL (ref 8.6–10.4)
Chloride: 101 mmol/L (ref 98–110)
Creat: 1.26 mg/dL — ABNORMAL HIGH (ref 0.60–0.88)
Glucose, Bld: 91 mg/dL (ref 65–99)
Potassium: 4.5 mmol/L (ref 3.5–5.3)
Sodium: 137 mmol/L (ref 135–146)

## 2015-08-22 MED ORDER — LISINOPRIL 5 MG PO TABS: 5.0000 mg | ORAL_TABLET | Freq: Every day | ORAL | Status: DC

## 2015-08-22 NOTE — Progress Notes (Signed)
CARDIOLOGY OFFICE NOTE  Date:  08/22/2015    [name] Date of Birth: 09/08/1914 Medical Record #[id number]  PCP:  Nadean Corwin, MD  Cardiologist:  Nahser/Samnang Shugars    Chief Complaint  Patient presents with  . Atrial Fibrillation  . Hypertension    6 month check - seen for Dr. Elease Hashimoto    History of Present Illness: [name] is a 82 y.o. female who presents today for a follow up visit. Seen for Dr. Elease Hashimoto. Seen for Dr. Elease Hashimoto. Former patient of Dr. Ronnald Nian. Has a remote history of syncope with a past loop record implant that turned out to be unremarkable. This was many years ago. Other issues include PAF, first degree AV block, HTN, cerebrovascular disease, OA, hypothyroidism and advanced age.   Was admitted to the hospital in December 2013 with a recurrent syncopal spell. She had been cooking in the kitchen and got weak and went to sit down. She lost consciousness and was out for about 30 to 60 seconds, then regained consciousness. No seizure, HA, weakness, etc. EMS was called. BP was low (60's/40's). She had a sinus pause on EKG of about 2.5 seconds (daughter has brought in strips for review). She had been fairly active and had done 3 loads of laundry that day. Dr. Elease Hashimoto saw her and felt that this was orthostasis/dehydation. Her lasix was stopped. Her beta blocker was stopped. BP is to be more liberal and we are not to achieve aggressive control. She was given some IV fluids. Her discharge summary mentions atrial fib during that time but all of her EKGs that I saw showed sinus rhythm. Dr. Harvie Bridge suggestion was to not be aggressive with her blood pressure control and to use support stockings.   Seen back in October of 2014. Was doing ok for the most part. Had been put back on her low dose beta blocker (atenolol 12.5 mg) due to having headaches and I stopped it - I felt her headaches were more related to arthritis. Called in early January with elevated BP - added  back 10 mg of Lasix just if systolic above 175.   2 prior ER visits back in June of 2015 - BP sky high. Low dose ACE started. Then having some left hand weakness/numbness. Sent back to the ER due to concerns for a stroke - BP running low and I cut the ACE back.  I last saw her in October and she was felt to be holding her own.   Phone call from last week - Daughter ([name]) calling (given permission to speak to her by pt) stating when she got up this morning she felt light headed and dizzy. She took her Lisinopril 5 mg 1/2 tablet. At 12:30 she took her BP and was 205/112 so she took another 1/2 tablet of the Lisinopril. At 1:10 BP was 177/87; at 1:35 was 160/83 and at 2:00 was 163/79. She is wanting to know if she should give her another 1/2 tablet of Lisinopril. When asked what her HR was she "thinks" it is 75. She states that her mother states that she is not light headed now. States she is not use to reading BP machine. Asked her to feel pulse to see if fast or irregular. States feels regular and not "really" fast. Spoke w/Anthonie Lotito,NP who states she shouldn't take another Lisinopril. She is comfortable with her BP being in the 160/ range. Notified daughter. She also states that her PCP started her on  Lyrica 50 mg on Monday for her restless leg. Will continue to monitor BP and advise us if BP becomes elevated again and if she has the lightheadedness.  Comes in today. Here with her daughter today. Her son, [name] has passed since her last visit here. She is doing ok for the most part but is still grieving - "wishes it had been me and not him". Now 82 years old. Continues to be limited by back and leg pain. She has been started on Lyrica by PCP. She had swelling in her legs prior to starting the Lyrica. Has been trying to elevate her legs. She is still cooking, ironing, and staying busy but also slowing down - more forgetful - has misplaced some checks, etc.. Does not sound like she gets  too much salt. I suspect she is on Furosemide - not sure about the mg. Her swelling has improved.   Past Medical History  Diagnosis Date  . Syncope and collapse     Negative loop recorder in the past  . Hypertension   . AV block, 1st degree   . CVD (cerebrovascular disease)     History of CVA  . Atrial fibrillation (HCC)     not a candidate for coumadin due to falls  . Pneumonia 09/11  . Traumatic enucleation of eye     left eye  . History of echocardiogram 9/11    normal EF, mild to mod MR & TR, mod pulmonary HTN  . Prediabetes   . Hypothyroidism   . GERD (gastroesophageal reflux disease)   . Osteoarthritis     Past Surgical History  Procedure Laterality Date  . Back surgery    . Inguinal hernia repair      left sided     Medications: Current Outpatient Prescriptions  Medication Sig Dispense Refill  . acetaminophen (TYLENOL) 325 MG tablet Take 325 mg by mouth every 6 (six) hours as needed for headache.    Marland Kitchen. aspirin 81 MG tablet Take 81 mg by mouth at bedtime.     . beta carotene w/minerals (OCUVITE) tablet Take 1 tablet by mouth 2 (two) times daily.    . Cholecalciferol (VITAMIN D3) 1000 UNITS CAPS Take 1,000 Units by mouth 2 (two) times daily.     . Flaxseed, Linseed, (FLAX SEED OIL PO) Take 1 tablet by mouth 2 (two) times daily.     Marland Kitchen. lisinopril (PRINIVIL,ZESTRIL) 5 MG tablet Take 1 tablet (5 mg total) by mouth daily. 30 tablet 6  . MAGNESIUM PO Take 1 tablet by mouth daily.    . ondansetron (ZOFRAN) 4 MG tablet Take 1 tablet (4 mg total) by mouth every 6 (six) hours. 12 tablet 0  . OVER THE COUNTER MEDICATION Aleve PRN for mild pain.    . pregabalin (LYRICA) 50 MG capsule Take 1 capsule (50 mg total) by mouth at bedtime.    . promethazine (PHENERGAN) 25 MG tablet Take 1 tablet (25 mg total) by mouth every 6 (six) hours as needed for nausea or vomiting. 90 tablet 0  . SYNTHROID 75 MCG tablet TAKE 1 TABLET BY MOUTH DAILY BEFORE BREAKFAST 90 tablet 1  . traMADol  (ULTRAM) 50 MG tablet TAKE 1 TABLET BY MOUTH 3-4 TIMES DAILY AS NEEDED FOR PAIN 100 tablet 0  . triamcinolone (KENALOG) 0.025 % cream Apply 1 application topically as needed (for rash).      No current facility-administered medications for this visit.    Allergies: Allergies  Allergen Reactions  . Augmentin [  Amoxicillin-Pot Clavulanate] Diarrhea  . Ciprocin-Fluocin-Procin [Fluocinolone Acetonide] Nausea And Vomiting  . Ciprofloxacin Hcl Nausea And Vomiting  . Codeine Hives and Nausea And Vomiting  . Sulfa Drugs Cross Reactors Hives and Nausea And Vomiting    Social History: The patient  reports that she has never smoked. She does not have any smokeless tobacco history on file. She reports that she does not drink alcohol or use illicit drugs.   Family History: The patient's family history includes Cancer in her brother; Heart attack in her brother and father; Stroke in her brother.   Review of Systems: Please see the history of present illness.   Otherwise, the review of systems is positive for none.   All other systems are reviewed and negative.   Physical Exam: VS:  BP 178/80 mmHg  Pulse 70  Ht  (1.575 m)  Wt 110 lb 12.8 oz (50.259 kg)  BMI 20.26 kg/m2  SpO2 100% .  BMI Body mass index is 20.26 kg/(m^2).  Wt Readings from Last 3 Encounters:  08/22/15 110 lb 12.8 oz (50.259 kg)  08/13/15 110 lb (49.896 kg)  05/09/15 107 lb (48.535 kg)    General:  Elderly female who is alert and in no acute distress. She is crying. HEENT: Normal. Neck: Supple, no JVD, carotid bruits, or masses noted.  Cardiac: Regular rate and rhythm. No murmurs, rubs, or gallops. No edema.  Respiratory:  Lungs are clear to auscultation bilaterally with normal work of breathing.  GI: Soft and nontender.  MS: No deformity or atrophy. Gait and ROM intact. Skin: Warm and dry. Color is normal.  Neuro:  Strength and sensation are intact and no gross focal deficits noted.  Psych: Alert, appropriate and  with normal affect.   LABORATORY DATA:  EKG:  EKG is not ordered today.  Lab Results  Component Value Date   WBC 6.9 08/13/2015   HGB 12.3 08/13/2015   HCT 36.5 08/13/2015   PLT 296 08/13/2015   GLUCOSE 86 08/13/2015   CHOL 197 08/13/2015   TRIG 70 08/13/2015   HDL 87 08/13/2015   LDLCALC 96 08/13/2015   ALT 24 08/13/2015   AST 29 08/13/2015   NA 137 08/13/2015   K 4.7 08/13/2015   CL 98 08/13/2015   CREATININE 1.11* 08/13/2015   BUN 21 08/13/2015   CO2 28 08/13/2015   TSH 3.02 08/13/2015   INR 0.99 11/09/2013   HGBA1C 5.6 08/13/2015   MICROALBUR 1.1 05/09/2015    BNP (last 3 results) No results for input(s): BNP in the last 8760 hours.  ProBNP (last 3 results) No results for input(s): PROBNP in the last 8760 hours.   Other Studies Reviewed Today:  Echo Study Conclusions from 04/2012  - Left ventricle: The cavity size was normal. Wall thickness was normal. Systolic function was normal. The estimated ejection fraction was in the range of 60% to 65%. Features are consistent with a pseudonormal left ventricular filling pattern, with concomitant abnormal relaxation and increased filling pressure (grade 2 diastolic dysfunction). - Mitral valve: Mild regurgitation. - Pulmonary arteries: Systolic pressure was moderately increased. PA peak pressure: 54mm Hg (S).  Assessment/Plan: 1. HTN - increase her Lisinopril to 5 mg a day.  2. Syncope - not recurred.   3. Advanced age   61. PAF - not a candidate for anticoagulation given age and unsteadiness.   5. Situational stress  6. Swelling - try to manage with elevating her legs - would not want her on this all the  time - she has gotten orthostatic in the past and I do not want her to fall.   Current medicines are reviewed with the patient today.  The patient does not have concerns regarding medicines other than what has been noted above.  The following changes have been made:  See above.  Labs/  tests ordered today include:    Orders Placed This Encounter  Procedures  . Basic metabolic panel     Disposition:   FU with me in 3 to 4 weeks.    Patient is agreeable to this plan and will call if any problems develop in the interim.   Signed: Rosalio Macadamia, RN, ANP-C 08/22/2015 12:12 PM  [facility] [address] [place], [place]  [address] Phone: [phone] Fax: [phone]

## 2015-08-22 NOTE — Telephone Encounter (Signed)
Pt's daughter called backed to let Norma FredricksonLori Gerhardt, NP know pt was taking HCTZ ( 25 mg ) due to swelling.  Lawson FiscalLori does not want pt taking that medication.  Pt's daughter aware and agreeable to plan.

## 2015-08-22 NOTE — Patient Instructions (Addendum)
We will be checking the following labs today - BMET    Medication Instructions:    Continue with your current medicines. BUT  I am increasing the Lisinopril to a whole tablet every day - this is at the drug store  Try to stop the fluid pill - call me if the swelling comes back - elevate the legs     Testing/Procedures To Be Arranged:  N/A  Follow-Up:   See me in 3 to 4 weeks.   Other Special Instructions:   N/A    If you need a refill on your cardiac medications before your next appointment, please call your pharmacy.   Call the [facility] office at [phone] if you have any questions, problems or concerns.

## 2015-08-27 ENCOUNTER — Other Ambulatory Visit: Payer: Self-pay | Admitting: Internal Medicine

## 2015-08-27 DIAGNOSIS — N3281 Overactive bladder: Secondary | ICD-10-CM

## 2015-08-27 MED ORDER — OXYBUTYNIN CHLORIDE 5 MG PO TABS: ORAL_TABLET | ORAL | Status: DC

## 2015-08-27 MED ORDER — OXYBUTYNIN CHLORIDE ER 10 MG PO TB24: ORAL_TABLET | ORAL | Status: DC

## 2015-09-05 ENCOUNTER — Ambulatory Visit: Payer: Self-pay | Admitting: Nurse Practitioner

## 2015-09-12 ENCOUNTER — Ambulatory Visit (INDEPENDENT_AMBULATORY_CARE_PROVIDER_SITE_OTHER): Payer: Medicare Other | Admitting: Nurse Practitioner

## 2015-09-12 ENCOUNTER — Encounter: Payer: Self-pay | Admitting: Nurse Practitioner

## 2015-09-12 VITALS — BP 164/68 | HR 60 | Ht 62.0 in | Wt 110.8 lb

## 2015-09-12 DIAGNOSIS — I10 Essential (primary) hypertension: Secondary | ICD-10-CM

## 2015-09-12 DIAGNOSIS — I480 Paroxysmal atrial fibrillation: Secondary | ICD-10-CM

## 2015-09-12 MED ORDER — LISINOPRIL 5 MG PO TABS: 5.0000 mg | ORAL_TABLET | Freq: Every day | ORAL | Status: DC

## 2015-09-12 NOTE — Progress Notes (Signed)
CARDIOLOGY OFFICE NOTE  Date:  09/12/2015    [name] Date of Birth: 02/12/1915 Medical Record #[id number]  PCP:  Nadean CorwinMCKEOWN,WILLIAM DAVID, MD  Cardiologist:  Nahser    Chief Complaint  Patient presents with  . Atrial Fibrillation  . Hypertension    Follow up visit - seen for Dr. Elease HashimotoNahser    History of Present Illness: Anne Lowerslma M Denslow is a 82 y.o. female who presents today for a follow up visit. Seen for Dr. Elease HashimotoNahser.  Former patient of Dr. Ronnald Nianennant's.   Has a remote history of syncope with a past loop record implant that turned out to be unremarkable. This was many years ago. Other issues include PAF, first degree AV block, HTN, cerebrovascular disease, OA, hypothyroidism and advanced age.   Was admitted to the hospital in December 2013 with a recurrent syncopal spell. She had been cooking in the kitchen and got weak and went to sit down. She lost consciousness and was out for about 30 to 60 seconds, then regained consciousness. No seizure, HA, weakness, etc. EMS was called. BP was low (60's/40's). She had a sinus pause on EKG of about 2.5 seconds (daughter has brought in strips for review). She had been fairly active and had done 3 loads of laundry that day. Dr. Elease HashimotoNahser saw her and felt that this was orthostasis/dehydation. Her lasix was stopped. Her beta blocker was stopped. BP is to be more liberal and we are not to achieve aggressive control. She was given some IV fluids. Her discharge summary mentions atrial fib during that time but all of her EKGs that I saw showed sinus rhythm. Dr. Harvie BridgeNahser's suggestion was to not be aggressive with her blood pressure control and to use support stockings.   Seen back in October of 2014. Was doing ok for the most part. Had been put back on her low dose beta blocker (atenolol 12.5 mg) due to having headaches and I stopped it - I felt her headaches were more related to arthritis. Called in early January with elevated BP - added back 10 mg of Lasix just  if systolic above 175.   2 prior ER visits back in June of 2015 - BP sky high. Low dose ACE started. Then having some left hand weakness/numbness. Sent back to the ER due to concerns for a stroke - BP ended up running low and I cut the ACE back.  I last saw her in October and she was felt to be holding her own.   I saw her just a few weeks ago - she was pretty out of sorts. Her son had died - she was grieving and taking this very hard. BP up. She was having swelling. Was back on HCTZ and also on Lyrica.   Comes back today. Here with Miranda her daughter. She seems more engaged today. She has had both a GI bug and a respiratory illness since she was last here. Now feeling better. The pharmacy has had an issue with filling the Lisinopril - told her "to take what you have". Has not had her medicine today - takes at [facility]. Not dizzy. BP has improved.   Past Medical History  Diagnosis Date  . Syncope and collapse     Negative loop recorder in the past  . Hypertension   . AV block, 1st degree   . CVD (cerebrovascular disease)     History of CVA  . Atrial fibrillation (HCC)     not a candidate for  coumadin due to falls  . Pneumonia 09/11  . Traumatic enucleation of eye     left eye  . History of echocardiogram 9/11    normal EF, mild to mod MR & TR, mod pulmonary HTN  . Prediabetes   . Hypothyroidism   . GERD (gastroesophageal reflux disease)   . Osteoarthritis     Past Surgical History  Procedure Laterality Date  . Back surgery    . Inguinal hernia repair      left sided     Medications: Current Outpatient Prescriptions  Medication Sig Dispense Refill  . acetaminophen (TYLENOL) 325 MG tablet Take 325 mg by mouth every 6 (six) hours as needed for headache.    Marland Kitchen aspirin 81 MG tablet Take 81 mg by mouth at bedtime.     . beta carotene w/minerals (OCUVITE) tablet Take 1 tablet by mouth 2 (two) times daily.    . Cholecalciferol (VITAMIN D3) 1000 UNITS CAPS Take 1,000 Units by mouth 2  (two) times daily.     . Flaxseed, Linseed, (FLAX SEED OIL PO) Take 1 tablet by mouth 2 (two) times daily.     Marland Kitchen lisinopril (PRINIVIL,ZESTRIL) 5 MG tablet Take 1 tablet (5 mg total) by mouth daily. 90 tablet 3  . MAGNESIUM PO Take 1 tablet by mouth daily.    . ondansetron (ZOFRAN) 4 MG tablet Take 1 tablet (4 mg total) by mouth every 6 (six) hours. 12 tablet 0  . OVER THE COUNTER MEDICATION Aleve PRN for mild pain.    Marland Kitchen oxybutynin (DITROPAN) 5 MG tablet Take 1/2 to 1 tablet 1 to 3 x/daily as needed for urinary frequency 90 tablet 5  . pregabalin (LYRICA) 50 MG capsule Take 1 capsule (50 mg total) by mouth at bedtime.    . promethazine (PHENERGAN) 25 MG tablet Take 1 tablet (25 mg total) by mouth every 6 (six) hours as needed for nausea or vomiting. 90 tablet 0  . SYNTHROID 75 MCG tablet TAKE 1 TABLET BY MOUTH DAILY BEFORE BREAKFAST 90 tablet 1  . traMADol (ULTRAM) 50 MG tablet TAKE 1 TABLET BY MOUTH 3-4 TIMES DAILY AS NEEDED FOR PAIN 100 tablet 0  . triamcinolone (KENALOG) 0.025 % cream Apply 1 application topically as needed (for rash).      No current facility-administered medications for this visit.    Allergies: Allergies  Allergen Reactions  . Augmentin [Amoxicillin-Pot Clavulanate] Diarrhea  . Ciprocin-Fluocin-Procin [Fluocinolone Acetonide] Nausea And Vomiting  . Ciprofloxacin Hcl Nausea And Vomiting  . Codeine Hives and Nausea And Vomiting  . Sulfa Drugs Cross Reactors Hives and Nausea And Vomiting    Social History: The patient  reports that she has never smoked. She does not have any smokeless tobacco history on file. She reports that she does not drink alcohol or use illicit drugs.   Family History: The patient's family history includes Cancer in her brother; Heart attack in her brother and father; Stroke in her brother.   Review of Systems: Please see the history of present illness.   Otherwise, the review of systems is positive for none.   All other systems are reviewed  and negative.   Physical Exam: VS:  BP 164/68 mmHg  Pulse 60  Ht  (1.575 m)  Wt 110 lb 12.8 oz (50.259 kg)  BMI 20.26 kg/m2 .  BMI Body mass index is 20.26 kg/(m^2).  Wt Readings from Last 3 Encounters:  09/12/15 110 lb 12.8 oz (50.259 kg)  08/22/15 110 lb 12.8  oz (50.259 kg)  08/13/15 110 lb (49.896 kg)    General: Pleasant. Well developed, well nourished and in no acute distress.  HEENT: Normal. Neck: Supple, no JVD, carotid bruits, or masses noted.  Cardiac: More irregular today. Rate ok. No real swelling.  Respiratory:  Lungs are clear to auscultation bilaterally with normal work of breathing.  GI: Soft and nontender.  MS: No deformity or atrophy. Gait and ROM intact. Skin: Warm and dry. Color is normal.  Neuro:  Strength and sensation are intact and no gross focal deficits noted.  Psych: Alert, appropriate and with normal affect.   LABORATORY DATA:  EKG:  EKG is not ordered today.  Lab Results  Component Value Date   WBC 6.9 08/13/2015   HGB 12.3 08/13/2015   HCT 36.5 08/13/2015   PLT 296 08/13/2015   GLUCOSE 91 08/22/2015   CHOL 197 08/13/2015   TRIG 70 08/13/2015   HDL 87 08/13/2015   LDLCALC 96 08/13/2015   ALT 24 08/13/2015   AST 29 08/13/2015   NA 137 08/22/2015   K 4.5 08/22/2015   CL 101 08/22/2015   CREATININE 1.26* 08/22/2015   BUN 32* 08/22/2015   CO2 25 08/22/2015   TSH 3.02 08/13/2015   INR 0.99 11/09/2013   HGBA1C 5.6 08/13/2015   MICROALBUR 1.1 05/09/2015    BNP (last 3 results) No results for input(s): BNP in the last 8760 hours.  ProBNP (last 3 results) No results for input(s): PROBNP in the last 8760 hours.   Other Studies Reviewed Today:  Echo Study Conclusions from 04/2012  - Left ventricle: The cavity size was normal. Wall thickness was normal. Systolic function was normal. The estimated ejection fraction was in the range of 60% to 65%. Features are consistent with a pseudonormal left ventricular filling  pattern, with concomitant abnormal relaxation and increased filling pressure (grade 2 diastolic dysfunction). - Mitral valve: Mild regurgitation. - Pulmonary arteries: Systolic pressure was moderately increased. PA peak pressure: 54mm Hg (S).  Assessment/Plan: 1. HTN - I have left her on her current dose of Lisinopril. BMET today.   2. Syncope - not recurred.   3. Advanced age   8. PAF - not a candidate for anticoagulation given age and unsteadiness.   5. Situational stress  6. Swelling - try to manage with elevating her legs - would not want her on this all the time - she has gotten orthostatic in the past and I do not want her to fall.    Current medicines are reviewed with the patient today.  The patient does not have concerns regarding medicines other than what has been noted above.  The following changes have been made:  See above.  Labs/ tests ordered today include:    Orders Placed This Encounter  Procedures  . Basic metabolic panel     Disposition:   FU with me in 3 months.   Patient is agreeable to this plan and will call if any problems develop in the interim.   Signed: Rosalio Macadamia, RN, ANP-C 09/12/2015 2:38 PM  [facility] [address] [place], [place]  [address] Phone: [phone] Fax: [phone]

## 2015-09-12 NOTE — Patient Instructions (Addendum)
We will be checking the following labs today - BMET   Medication Instructions:    Continue with your current medicines.     Testing/Procedures To Be Arranged:  N/A  Follow-Up:   See me in 3 to 4 months.     Other Special Instructions:   Try eating a snack right before going to bed    If you need a refill on your cardiac medications before your next appointment, please call your pharmacy.   Call the [facility] office at [phone] if you have any questions, problems or concerns.

## 2015-09-13 LAB — BASIC METABOLIC PANEL
BUN: 22 mg/dL (ref 7–25)
CO2: 27 mmol/L (ref 20–31)
Calcium: 9.5 mg/dL (ref 8.6–10.4)
Chloride: 103 mmol/L (ref 98–110)
Creat: 1.09 mg/dL — ABNORMAL HIGH (ref 0.60–0.88)
Glucose, Bld: 98 mg/dL (ref 65–99)
Potassium: 5.1 mmol/L (ref 3.5–5.3)
Sodium: 140 mmol/L (ref 135–146)

## 2015-09-18 ENCOUNTER — Other Ambulatory Visit: Payer: Self-pay | Admitting: Nurse Practitioner

## 2015-09-18 NOTE — Telephone Encounter (Signed)
New Message:    She wants you to call in some fluid medicine for the pt. Feet and legs are swelling again.Anne FiscalLori had said to call back if she need the medicine. Please call to [phone].

## 2015-09-19 ENCOUNTER — Other Ambulatory Visit: Payer: Self-pay | Admitting: *Deleted

## 2015-09-19 MED ORDER — HYDROCHLOROTHIAZIDE 25 MG PO TABS: 12.5000 mg | ORAL_TABLET | ORAL | Status: DC

## 2015-09-19 NOTE — Telephone Encounter (Signed)
I sent in new script for HCTZ ( 25 MG ) as needed for swelling to take only ( 12. 5) mg one half tablet prn for swelling.

## 2015-09-19 NOTE — Telephone Encounter (Signed)
S/w pt's daughter per DPR.  I reviewed Norma FredricksonLori Gerhardt, NP, notes with Tereso NewcomerScott Weaver, PA-C since Lawson FiscalLori is on vacation and I didn't see in chart what duiretic Lawson FiscalLori was referring to that dropped the pt's bp.  Scott reviewed chart and stated HCTZ ( 12.5 mg ) only for leg swelling and do not take lisinopril when pt is taking HCTZ. Pt's daughter stated did give pt one half tablet of lasix yesterday due to swelling.  I will FYI when she gets back from vacation.

## 2015-09-19 NOTE — Telephone Encounter (Signed)
She should not take every day as she has had orthostatic hypotension in the past. If she takes more than 2 days in a row, she should call. Tereso NewcomerScott Harlowe Dowler, PA-C   09/19/2015 2:09 PM

## 2015-09-23 NOTE — Telephone Encounter (Signed)
Ask Miranda to call with an update in the next week or so.   I agree with Scott's recommendation.

## 2015-09-27 ENCOUNTER — Other Ambulatory Visit: Payer: Self-pay | Admitting: *Deleted

## 2015-09-27 MED ORDER — PREGABALIN 50 MG PO CAPS: 50.0000 mg | ORAL_CAPSULE | Freq: Every day | ORAL | Status: DC

## 2015-10-07 ENCOUNTER — Other Ambulatory Visit: Payer: Self-pay | Admitting: Physician Assistant

## 2015-10-08 NOTE — Telephone Encounter (Signed)
RX CALLED INTO [facility]. 

## 2015-10-09 ENCOUNTER — Other Ambulatory Visit: Payer: Self-pay | Admitting: Physician Assistant

## 2015-10-14 ENCOUNTER — Encounter: Payer: Self-pay | Admitting: *Deleted

## 2015-10-16 ENCOUNTER — Encounter: Payer: Self-pay | Admitting: Internal Medicine

## 2015-10-16 ENCOUNTER — Ambulatory Visit (INDEPENDENT_AMBULATORY_CARE_PROVIDER_SITE_OTHER): Payer: Medicare Other | Admitting: Internal Medicine

## 2015-10-16 VITALS — BP 118/68 | HR 80 | Temp 98.2°F | Resp 16 | Ht 62.0 in | Wt 111.0 lb

## 2015-10-16 DIAGNOSIS — L97921 Non-pressure chronic ulcer of unspecified part of left lower leg limited to breakdown of skin: Secondary | ICD-10-CM

## 2015-10-16 DIAGNOSIS — I739 Peripheral vascular disease, unspecified: Secondary | ICD-10-CM | POA: Diagnosis not present

## 2015-10-16 MED ORDER — DOXYCYCLINE HYCLATE 100 MG PO CAPS: 100.0000 mg | ORAL_CAPSULE | Freq: Two times a day (BID) | ORAL | Status: DC

## 2015-10-16 NOTE — Patient Instructions (Signed)
Please use 1/2 tablet of HCTZ for the next couple days as needed for leg swelling.  Monitor blood pressure very closely.    Please cut regular blood pressure medication in half if you do take the fluid pills.    Please drink plenty of water.  Please take doxycycline twice daily with food.  Please call the office if she develops worsening redness, fever, nausea or vomiting.  Please place iodine mixed with sugar on the wound every 48 hours.  Keep it clean and dry as much as possible.

## 2015-10-16 NOTE — Progress Notes (Signed)
   Subjective:    Patient ID: [name], female    DOB: 09/14/1914, 82 y.o.   MRN: [id number]  Wound Check  Patient presents with her daughter for an evaluation of a wound on her left anterior shin.  She reports that this has been present as a dark quarter sized spot but this got stuck to her compression stockings and opened a wound.  NO drainage.  Mild redness to left lower shin.  NO changes in mental status, no fevers, chills, N/V.    Review of Systems  Constitutional: Negative for fever, chills and fatigue.  Gastrointestinal: Negative for nausea and vomiting.  Skin: Positive for color change and wound.       Objective:   Physical Exam  Constitutional: She appears well-developed and well-nourished. No distress.  HENT:  Head: Normocephalic.  Mouth/Throat: Oropharynx is clear and moist. No oropharyngeal exudate.  Eyes: Conjunctivae are normal. No scleral icterus.  Neck: Normal range of motion. Neck supple. No JVD present. No thyromegaly present.  Cardiovascular: Normal rate and regular rhythm.  Exam reveals no gallop and no friction rub.   Murmur heard. Diminished pedal pulses bilaterally.  2+ cap refill to toes.  Feet warm and pink.    Pulmonary/Chest: Effort normal and breath sounds normal. No respiratory distress. She has no wheezes. She has no rales. She exhibits no tenderness.  Abdominal: Soft. Bowel sounds are normal.  Lymphadenopathy:    She has no cervical adenopathy.  Skin: She is not diaphoretic.     Nursing note and vitals reviewed.   Filed Vitals:   10/16/15 1420  BP: 118/68  Pulse: 80  Temp: 98.2 F (36.8 C)  Resp: 16         Assessment & Plan:     1. Peripheral vascular disease (HCC) -cont compression stockings -elevate legs above heart -2 days of 1/2 tablet of HCTZ -cut other BP meds in half with HCTZ  2. Leg ulcer, left, limited to breakdown of skin (HCC) -doxycycline -wound dressed here -iodine sugar slurry

## 2015-10-17 ENCOUNTER — Telehealth: Payer: Self-pay | Admitting: Internal Medicine

## 2015-10-17 ENCOUNTER — Other Ambulatory Visit: Payer: Self-pay | Admitting: Internal Medicine

## 2015-10-17 NOTE — Telephone Encounter (Signed)
patient's daughter requests [facility]. Dr Oneta RackMcKeown completed referral and faxed to hospice.

## 2015-10-23 ENCOUNTER — Other Ambulatory Visit: Payer: Self-pay | Admitting: Internal Medicine

## 2015-10-23 DIAGNOSIS — M79606 Pain in leg, unspecified: Secondary | ICD-10-CM | POA: Diagnosis not present

## 2015-10-23 MED ORDER — ONDANSETRON 8 MG PO TBDP: ORAL_TABLET | ORAL | Status: DC

## 2015-11-13 ENCOUNTER — Encounter: Payer: Self-pay | Admitting: Physician Assistant

## 2015-11-13 ENCOUNTER — Ambulatory Visit (INDEPENDENT_AMBULATORY_CARE_PROVIDER_SITE_OTHER): Payer: Medicare Other | Admitting: Physician Assistant

## 2015-11-13 VITALS — BP 118/68 | HR 80 | Temp 98.8°F | Resp 14 | Ht 62.0 in | Wt 111.2 lb

## 2015-11-13 DIAGNOSIS — E559 Vitamin D deficiency, unspecified: Secondary | ICD-10-CM

## 2015-11-13 DIAGNOSIS — M1990 Unspecified osteoarthritis, unspecified site: Secondary | ICD-10-CM

## 2015-11-13 DIAGNOSIS — I440 Atrioventricular block, first degree: Secondary | ICD-10-CM | POA: Diagnosis not present

## 2015-11-13 DIAGNOSIS — I679 Cerebrovascular disease, unspecified: Secondary | ICD-10-CM | POA: Diagnosis not present

## 2015-11-13 DIAGNOSIS — E039 Hypothyroidism, unspecified: Secondary | ICD-10-CM

## 2015-11-13 DIAGNOSIS — K219 Gastro-esophageal reflux disease without esophagitis: Secondary | ICD-10-CM

## 2015-11-13 DIAGNOSIS — N183 Chronic kidney disease, stage 3 unspecified: Secondary | ICD-10-CM

## 2015-11-13 DIAGNOSIS — R6889 Other general symptoms and signs: Secondary | ICD-10-CM

## 2015-11-13 DIAGNOSIS — Z0000 Encounter for general adult medical examination without abnormal findings: Secondary | ICD-10-CM

## 2015-11-13 DIAGNOSIS — E441 Mild protein-calorie malnutrition: Secondary | ICD-10-CM

## 2015-11-13 DIAGNOSIS — Z79899 Other long term (current) drug therapy: Secondary | ICD-10-CM | POA: Diagnosis not present

## 2015-11-13 DIAGNOSIS — I482 Chronic atrial fibrillation, unspecified: Secondary | ICD-10-CM

## 2015-11-13 DIAGNOSIS — I739 Peripheral vascular disease, unspecified: Secondary | ICD-10-CM

## 2015-11-13 DIAGNOSIS — I10 Essential (primary) hypertension: Secondary | ICD-10-CM | POA: Diagnosis not present

## 2015-11-13 DIAGNOSIS — Z0001 Encounter for general adult medical examination with abnormal findings: Secondary | ICD-10-CM | POA: Diagnosis not present

## 2015-11-13 DIAGNOSIS — E782 Mixed hyperlipidemia: Secondary | ICD-10-CM

## 2015-11-13 DIAGNOSIS — R7303 Prediabetes: Secondary | ICD-10-CM

## 2015-11-13 LAB — CBC WITH DIFFERENTIAL/PLATELET
Basophils Absolute: 74 cells/uL (ref 0–200)
Basophils Relative: 1 %
EOS PCT: 10 %
Eosinophils Absolute: 740 cells/uL — ABNORMAL HIGH (ref 15–500)
HCT: 37.3 % (ref 35.0–45.0)
HEMOGLOBIN: 12.3 g/dL (ref 11.7–15.5)
LYMPHS ABS: 1850 {cells}/uL (ref 850–3900)
Lymphocytes Relative: 25 %
MCH: 30.7 pg (ref 27.0–33.0)
MCHC: 33 g/dL (ref 32.0–36.0)
MCV: 93 fL (ref 80.0–100.0)
MPV: 10.2 fL (ref 7.5–12.5)
Monocytes Absolute: 1110 cells/uL — ABNORMAL HIGH (ref 200–950)
Monocytes Relative: 15 %
NEUTROS ABS: 3626 {cells}/uL (ref 1500–7800)
NEUTROS PCT: 49 %
Platelets: 267 10*3/uL (ref 140–400)
RBC: 4.01 MIL/uL (ref 3.80–5.10)
RDW: 13.5 % (ref 11.0–15.0)
WBC: 7.4 10*3/uL (ref 3.8–10.8)

## 2015-11-13 NOTE — Progress Notes (Signed)
MEDICARE ANNUAL WELLNESS VISIT AND FOLLOW UP  Assessment:   1. Chronic atrial fibrillation Continue cardio follow up, no coumadin due to fall risk/age  80. CVD (cerebrovascular disease) Control blood pressure, cholesterol, glucose within reason for age, patient and daughter understand increased risk of CVA  3. AV block, 1st degree monitor  4. Essential hypertension Monitor, will not have strict BP control due to fall risk, explained to patient and daughter.  - CBC with Differential  5. Gastroesophageal reflux disease without esophagitis Diet controlled  6. Hypothyroidism, unspecified hypothyroidism type - TSH  7. Prediabetes Discussed general issues about diabetes pathophysiology and management., Educational material distributed., Suggested low cholesterol diet., Encouraged aerobic exercise., Discussed foot care., Reminded to get yearly retinal exam.  8. Osteoarthritis, unspecified osteoarthritis type, unspecified site controlled  9. Hyperlipidemia - Lipid panel  10. Hypertension - continue medications, DASH diet, exercise and monitor at home. Call if greater than 130/80.   11. Encounter for long-term (current) use of medications - Magnesium  12. Vitamin D deficiency - Vit D  25 hydroxy (rtn osteoporosis monitoring  13. Gastroesophageal reflux disease without esophagitis Continue PPI/H2 blocker, diet discussed  14. Osteoarthritis, unspecified osteoarthritis type, unspecified site Monitor pain level, continue tramadol as needed, discussed fall risk versus benefit and patient benefits from medication  15. Malnutrition of mild degree (HCC) Add ensure  16. Peripheral vascular disease (HCC) Monitor for ulcer, ulcer has healed   Future Appointments Date Time Provider Department Center  12/19/2015 1:30 PM Rosalio Macadamia, NP [facility]  02/13/2016 2:45 PM Terri Piedra, PA-C [facility] None  05/12/2016 3:00 PM Quentin Mulling, PA-C [facility] None     Plan:   During the course of the visit the patient was educated and counseled about appropriate screening and preventive services including:    Pneumococcal vaccine   Influenza vaccine  Td vaccine  Prevnar 13  Screening electrocardiogram  Screening mammography  Bone densitometry screening  Colorectal cancer screening  Diabetes screening  Glaucoma screening  Nutrition counseling   Advanced directives: given info/requested copies  Conditions/risks identified: Diabetes is at goal, ACE/ARB therapy: Yes. Urinary Incontinence is an issue: discussed non pharmacology and pharmacology options.  Fall risk: high- discussed PT, home fall assessment, medications.    Subjective:   [name] is a 82 y.o. female who presents for Medicare Annual Wellness Visit and 3 month follow up on hypertension, prediabetes, hyperlipidemia, vitamin D def.   Son did take care of her however he passed, daughter [name] is here and her son is taking care of them. She has DNR.  She is difficult of hearing, she has hearing aids but does not use them him.  She was set up with pallative care of [place], had a visit that daughter liked.  She has lower back pain, takes tramadol for it, this helps some but not much.  Her blood pressure has been controlled at home, today their BP is BP: 118/68 mmHg  She does not workout. She denies chest pain, shortness of breath, dizziness. She has chronic afib, is not on anticoagulant due to fall risk. Follows with Dr. Patty Sermons.  She has PVD and had ulcer on left shin but has healed and she is doing better, continues to wear compression stockings.   She is not on cholesterol medication and denies myalgias. Her cholesterol is at goal. The cholesterol last visit was:   Lab Results  Component Value Date   CHOL 197 08/13/2015   HDL 87 08/13/2015   LDLCALC 96  08/13/2015   TRIG 70 08/13/2015   CHOLHDL 2.3 08/13/2015    She has been working on diet and exercise for  prediabetes, and denies polydipsia, polyuria and visual disturbances. Last A1C in the office was:  Lab Results  Component Value Date   HGBA1C 5.6 08/13/2015   Lab Results  Component Value Date   GFRNONAA 41* 08/13/2015   Patient is on Vitamin D supplement.   Lab Results  Component Value Date   VD25OH 46 08/13/2015     She is on thyroid medication. Her medication was not changed last visit.   Lab Results  Component Value Date   TSH 3.02 08/13/2015  .  BMI is Body mass index is 20.33 kg/(m^2)., she is working on [employer], on ensure Wt Readings from Last 3 Encounters:  11/13/15 111 lb 3.2 oz (50.44 kg)  10/16/15 111 lb (50.349 kg)  09/12/15 110 lb 12.8 oz (50.259 kg)   Medication Review Current Outpatient Prescriptions on File Prior to Visit  Medication Sig Dispense Refill  . acetaminophen (TYLENOL) 325 MG tablet Take 325 mg by mouth every 6 (six) hours as needed for headache.    Marland Kitchen aspirin 81 MG tablet Take 81 mg by mouth at bedtime.     . beta carotene w/minerals (OCUVITE) tablet Take 1 tablet by mouth 2 (two) times daily.    . Cholecalciferol (VITAMIN D3) 1000 UNITS CAPS Take 1,000 Units by mouth 2 (two) times daily.     . Flaxseed, Linseed, (FLAX SEED OIL PO) Take 1 tablet by mouth 2 (two) times daily.     . hydrochlorothiazide (HYDRODIURIL) 25 MG tablet Take 0.5 tablets (12.5 mg total) by mouth as directed. 30 tablet 2  . lisinopril (PRINIVIL,ZESTRIL) 5 MG tablet Take 1 tablet (5 mg total) by mouth daily. 90 tablet 3  . MAGNESIUM PO Take 1 tablet by mouth daily.    . ondansetron (ZOFRAN) 4 MG tablet Take 1 tablet (4 mg total) by mouth every 6 (six) hours. 12 tablet 0  . OVER THE COUNTER MEDICATION Aleve PRN for mild pain.    Marland Kitchen oxybutynin (DITROPAN) 5 MG tablet Take 1/2 to 1 tablet 1 to 3 x/daily as needed for urinary frequency 90 tablet 5  . pregabalin (LYRICA) 50 MG capsule Take 1 capsule (50 mg total) by mouth at bedtime. 105 capsule 0  . SYNTHROID 75 MCG tablet TAKE  1 TABLET DAILY BEFORE BREAKFAST 90 tablet 0  . traMADol (ULTRAM) 50 MG tablet TAKE 1 TABLET BY MOUTH 3 TO 4 TIMES A DAY AS NEEDED FOR PAIN 100 tablet 0  . triamcinolone (KENALOG) 0.025 % cream Apply 1 application topically as needed (for rash).      No current facility-administered medications on file prior to visit.    Allergies: Allergies  Allergen Reactions  . Augmentin [Amoxicillin-Pot Clavulanate] Diarrhea  . Ciprocin-Fluocin-Procin [Fluocinolone Acetonide] Nausea And Vomiting  . Ciprofloxacin Hcl Nausea And Vomiting  . Codeine Hives and Nausea And Vomiting  . Sulfa Drugs Cross Reactors Hives and Nausea And Vomiting    Current Problems (verified) Patient Active Problem List   Diagnosis Date Noted  . Malnutrition of mild degree (HCC) 11/13/2015  . Vitamin D deficiency 08/30/2014  . Medication management 08/30/2014  . Hyperlipidemia 06/29/2013  . Prediabetes   . GERD (gastroesophageal reflux disease)   . Hypertension   . AV block, 1st degree   . CVD (cerebrovascular disease)   . Osteoarthritis   . Hypothyroidism   . Atrial fibrillation (HCC)  Screening Tests Immunization History  Administered Date(s) Administered  . Influenza Split 02/03/2013  . Influenza, High Dose Seasonal PF 02/22/2014, 02/07/2015  . Pneumococcal Conjugate-13 04/11/2014  . Pneumococcal-Unspecified 05/26/2009  . Td 03/23/2013  . Zoster 05/26/2005   Preventative care: Last colonoscopy: 2009 Last mammogram: remote, declines due to age Last pap smear/pelvic exam: remote, declines due to age  DEXA: declines due to age CT head 06/2014 CT AB 02/2014 Echo 2013 CXR 02/2014  Prior vaccinations: TD or Tdap: 2014  Influenza: 2016 Pneumococcal: 2011 Prevnar13: 2015 Shingles/Zostavax: 2007  Names of Other Physician/Practitioners you currently use: 1. [facility]- here for primary care 2. Dr. Hyacinth Meeker, eye doctor, last visit Sept 2015 3. None, dentures,  dentist Patient Care Team: Lucky Cowboy, MD as PCP - General (Internal Medicine) Bernette Redbird, MD as Consulting Physician (Gastroenterology) Cassell Clement, MD as Consulting Physician (Cardiology) Blima Ledger, OD (Optometry)  Surgical: She  has past surgical history that includes Back surgery and Inguinal hernia repair. Family  Patient's family history includes Cancer in her brother; Heart attack in her brother and father; Stroke in her brother. Social history  She reports that she has never smoked. She does not have any smokeless tobacco history on file. She reports that she does not drink alcohol or use illicit drugs.  MEDICARE WELLNESS OBJECTIVES: Tobacco use: She does not smoke.  Patient is not a former smoker. If yes, counseling given Alcohol Current alcohol use: none Diet: in general, a "healthy" diet   Physical activity: Current Exercise Habits: The patient does not participate in regular exercise at present Cardiac risk factors: Cardiac Risk Factors include: advanced age (>59men, >66 women);dyslipidemia;hypertension;sedentary lifestyle Depression/mood screen:   Depression screen [facility] 2/9 11/13/2015  Decreased Interest 0  Down, Depressed, Hopeless 1  PHQ - 2 Score 1    ADLs:  In your present state of health, do you have any difficulty performing the following activities: 11/13/2015 08/13/2015  Hearing? [name]  Vision? Y N  Difficulty concentrating or making decisions? N N  Walking or climbing stairs? N N  Dressing or bathing? N N  Doing errands, shopping? [name]  Preparing Food and eating ? N -  Using the Toilet? N -  In the past six months, have you accidently leaked urine? Y -  Do you have problems with loss of bowel control? N -  Managing your Medications? Y -  Managing your Finances? Y -  Housekeeping or managing your Housekeeping? Y -    Cognitive Testing  Alert? Yes  Normal Appearance?Yes  Oriented to person? Yes  Place? Yes   Time? Yes  Recall of three  objects?  Yes  Can perform simple calculations? Yes  Displays appropriate judgment?Yes  Can read the correct time from a watch face?Yes  EOL planning: Does patient have an advance directive?: Yes Type of Advance Directive: Out of facility DNR (pink MOST or yellow form) Does patient want to make changes to advanced directive?: No - Patient declined Copy of advanced directive(s) in chart?: No - copy requested Pre-existing out of facility DNR order (yellow form or pink MOST form): Pink MOST form placed in chart (order not valid for inpatient use), Yellow form placed in chart (order not valid for inpatient use)  Yes Objective:   Today's Vitals   11/13/15 1424  BP: 118/68  Pulse: 80  Temp: 98.8 F (37.1 C)  TempSrc: Temporal  Resp: 14  Height: 5\' 2"  (1.575 m)  Weight: 111 lb 3.2  oz (50.44 kg)  PainSc: 4   PainLoc: Back   Body mass index is 20.33 kg/(m^2).  General appearance: alert, no distress, WD/WN,  female HEENT: normocephalic, sclerae anicteric, TMs pearly, nares patent, no discharge or erythema, pharynx normal, decreased hearing Oral cavity: MMM, no lesions Neck: supple, no lymphadenopathy, no thyromegaly, no masses Heart: Irreg irreg, normal S1, S2, with holosystolic 2/6 murmur Lungs: CTA bilaterally, no wheezes, rhonchi, or rales Abdomen: +bs, soft, non tender, non distended, no masses, no hepatomegaly, no splenomegaly Musculoskeletal: nontender, no swelling, no obvious deformity Extremities: well healed ulcer on left anterior shin, wearing compression stockings, no edema, no cyanosis, no clubbing Pulses: 2+ symmetric upper extremities and decrease bilateral lower extremities, normal cap refill Neurological: alert, oriented x 3, CN2-12 intact, 4/5 strength upper extremities and lower extremities, DTRs 2+ throughout, no cerebellar signs, gait antalgic Psychiatric: normal affect, behavior normal, pleasant  Breast: defer Gyn: defer Rectal: defer   Medicare Attestation I  have personally reviewed: The patient's medical and social history Their use of alcohol, tobacco or illicit drugs Their current medications and supplements The patient's functional ability including ADLs,fall risks, home safety risks, cognitive, and hearing and visual impairment Diet and physical activities Evidence for depression or mood disorders  The patient's weight, height, BMI, and visual acuity have been recorded in the chart.  I have made referrals, counseling, and provided education to the patient based on review of the above and I have provided the patient with a written personalized care plan for preventive services.     Quentin Mullingmanda Degan Hanser, PA-C   11/13/2015

## 2015-11-14 LAB — TSH: TSH: 2.56 m[IU]/L

## 2015-11-14 LAB — HEPATIC FUNCTION PANEL
ALBUMIN: 4.1 g/dL (ref 3.6–5.1)
ALT: 18 U/L (ref 6–29)
AST: 29 U/L (ref 10–35)
Alkaline Phosphatase: 51 U/L (ref 33–130)
BILIRUBIN DIRECT: 0.1 mg/dL (ref <=0.2)
Indirect Bilirubin: 0.5 mg/dL (ref 0.2–1.2)
TOTAL PROTEIN: 6.6 g/dL (ref 6.1–8.1)
Total Bilirubin: 0.6 mg/dL (ref 0.2–1.2)

## 2015-11-14 LAB — BASIC METABOLIC PANEL WITH GFR
BUN: 26 mg/dL — AB (ref 7–25)
CALCIUM: 9.3 mg/dL (ref 8.6–10.4)
CO2: 25 mmol/L (ref 20–31)
CREATININE: 1.13 mg/dL — AB (ref 0.60–0.88)
Chloride: 103 mmol/L (ref 98–110)
GFR, Est African American: 47 mL/min — ABNORMAL LOW (ref >=60)
GFR, Est Non African American: 41 mL/min — ABNORMAL LOW (ref >=60)
Glucose, Bld: 79 mg/dL (ref 65–99)
Potassium: 4.8 mmol/L (ref 3.5–5.3)
SODIUM: 139 mmol/L (ref 135–146)

## 2015-11-14 LAB — MAGNESIUM: MAGNESIUM: 2 mg/dL (ref 1.5–2.5)

## 2015-12-19 ENCOUNTER — Ambulatory Visit (INDEPENDENT_AMBULATORY_CARE_PROVIDER_SITE_OTHER): Payer: Medicare Other | Admitting: Nurse Practitioner

## 2015-12-19 ENCOUNTER — Encounter: Payer: Self-pay | Admitting: Nurse Practitioner

## 2015-12-19 VITALS — BP 130/68 | HR 72 | Ht 62.0 in | Wt 109.0 lb

## 2015-12-19 DIAGNOSIS — I10 Essential (primary) hypertension: Secondary | ICD-10-CM

## 2015-12-19 DIAGNOSIS — I480 Paroxysmal atrial fibrillation: Secondary | ICD-10-CM | POA: Diagnosis not present

## 2015-12-19 NOTE — Patient Instructions (Addendum)
We will be checking the following labs today - NONE   Medication Instructions:    Continue with your current medicines.     Testing/Procedures To Be Arranged:  N/A  Follow-Up:   See me in 6 months.    Other Special Instructions:   N/A    If you need a refill on your cardiac medications before your next appointment, please call your pharmacy.   Call the [facility] office at [phone] if you have any questions, problems or concerns.      

## 2015-12-19 NOTE — Progress Notes (Signed)
CARDIOLOGY OFFICE NOTE  Date:  12/19/2015    [name] Date of Birth: 1914-11-01 Medical Record #[id number]  PCP:  Nadean Corwin, MD  Cardiologist:  Tyrone Sage   Chief Complaint  Patient presents with  . Hypertension  . Atrial Fibrillation    3 month check - seen for Dr. Elease Hashimoto    History of Present Illness: [name] is a 82 y.o. female who presents today for a 3 month check.  Seen for Dr. Elease Hashimoto.  Former patient of Dr. Ronnald Nian.   Has a remote history of syncope with a past loop record implant that turned out to be unremarkable. This was many years ago. Other issues include PAF, first degree AV block, HTN, cerebrovascular disease, OA, hypothyroidism and advanced age.   Was admitted to the hospital in December 2013 with a recurrent syncopal spell. She had been cooking in the kitchen and got weak and went to sit down. She lost consciousness and was out for about 30 to 60 seconds, then regained consciousness. No seizure, HA, weakness, etc. EMS was called. BP was low (60's/40's). She had a sinus pause on EKG of about 2.5 seconds (daughter has brought in strips for review). She had been fairly active and had done 3 loads of laundry that day. Dr. Elease Hashimoto saw her and felt that this was orthostasis/dehydation. Her lasix was stopped. Her beta blocker was stopped. BP is to be more liberal and we are not to achieve aggressive control. She was given some IV fluids. Her discharge summary mentions atrial fib during that time but all of her EKGs that I saw showed sinus rhythm. Dr. Harvie Bridge suggestion was to not be aggressive with her blood pressure control and to use support stockings.   Seen back in October of 2014. Was doing ok for the most part. Had been put back on her low dose beta blocker (atenolol 12.5 mg) due to having headaches and I stopped it - I felt her headaches were more related to arthritis. Called in early January with elevated BP - added back 10 mg of Lasix  just if systolic above 175.   2 prior ER visits back in June of 2015 - BP sky high. Low dose ACE started. Then having some left hand weakness/numbness. Sent back to the ER due to concerns for a stroke - BP ended up running low and I cut the ACE back.  I last saw her in October and she was felt to be holding her own.   I saw her back in April - she was pretty out of sorts. Her son had died - she was grieving and taking this very hard. BP up. She was having swelling. Was back on HCTZ and also on Lyrica. At her last visit with me she was doing some better.   Comes back today. Here with Miranda her daughter. Has seen PCP last month with labs. She continues to decline. She takes her medicines when she wants to - has forgotten some. She has had palliative care come by - Miranda is going to have them come back again. Her back pain seems worse. She is not really comfortable. Tells me she cries herself to sleep every night. Her heart is clearly broken. Amazingly, she is able to still cook and do housework.    Past Medical History:  Diagnosis Date  . Atrial fibrillation (HCC)    not a candidate for coumadin due to falls  . AV block, 1st degree   .  CVD (cerebrovascular disease)    History of CVA  . GERD (gastroesophageal reflux disease)   . History of echocardiogram 9/11   normal EF, mild to mod MR & TR, mod pulmonary HTN  . Hypertension   . Hypothyroidism   . Osteoarthritis   . Pneumonia 09/11  . Prediabetes   . Syncope and collapse    Negative loop recorder in the past  . Traumatic enucleation of eye    left eye    Past Surgical History:  Procedure Laterality Date  . BACK SURGERY    . INGUINAL HERNIA REPAIR     left sided     Medications: Current Outpatient Prescriptions  Medication Sig Dispense Refill  . acetaminophen (TYLENOL) 325 MG tablet Take 325 mg by mouth every 6 (six) hours as needed for headache.    Marland Kitchen aspirin 81 MG tablet Take 81 mg by mouth at bedtime.     . beta  carotene w/minerals (OCUVITE) tablet Take 1 tablet by mouth 2 (two) times daily.    . Cholecalciferol (VITAMIN D3) 1000 UNITS CAPS Take 1,000 Units by mouth 2 (two) times daily.     . Flaxseed, Linseed, (FLAX SEED OIL PO) Take 1 tablet by mouth 2 (two) times daily.     . hydrochlorothiazide (HYDRODIURIL) 25 MG tablet Take 0.5 tablets (12.5 mg total) by mouth as directed. 30 tablet 2  . lisinopril (PRINIVIL,ZESTRIL) 5 MG tablet Take 1 tablet (5 mg total) by mouth daily. 90 tablet 3  . MAGNESIUM PO Take 1 tablet by mouth daily.    . ondansetron (ZOFRAN) 4 MG tablet Take 1 tablet (4 mg total) by mouth every 6 (six) hours. 12 tablet 0  . OVER THE COUNTER MEDICATION Aleve PRN for mild pain.    Marland Kitchen oxybutynin (DITROPAN) 5 MG tablet Take 1/2 to 1 tablet 1 to 3 x/daily as needed for urinary frequency 90 tablet 5  . pregabalin (LYRICA) 50 MG capsule Take 1 capsule (50 mg total) by mouth at bedtime. 105 capsule 0  . SYNTHROID 75 MCG tablet TAKE 1 TABLET DAILY BEFORE BREAKFAST 90 tablet 0  . traMADol (ULTRAM) 50 MG tablet TAKE 1 TABLET BY MOUTH 3 TO 4 TIMES A DAY AS NEEDED FOR PAIN 100 tablet 0  . triamcinolone (KENALOG) 0.025 % cream Apply 1 application topically as needed (for rash).      No current facility-administered medications for this visit.     Allergies: Allergies  Allergen Reactions  . Augmentin [Amoxicillin-Pot Clavulanate] Diarrhea  . Ciprocin-Fluocin-Procin [Fluocinolone Acetonide] Nausea And Vomiting  . Ciprofloxacin Hcl Nausea And Vomiting  . Codeine Hives and Nausea And Vomiting  . Sulfa Drugs Cross Reactors Hives and Nausea And Vomiting    Social History: The patient  reports that she has never smoked. She does not have any smokeless tobacco history on file. She reports that she does not drink alcohol or use drugs.   Family History: The patient's family history includes Cancer in her brother; Heart attack in her brother and father; Stroke in her brother.   Review of  Systems: Please see the history of present illness.   Otherwise, the review of systems is positive for none.   All other systems are reviewed and negative.   Physical Exam: VS:  BP 130/68   Pulse 72   Ht 5\' 2"  (1.575 m)   Wt 109 lb (49.4 kg)   SpO2 98% Comment: at rest  BMI 19.94 kg/m  .  BMI Body mass index is  19.94 kg/m.  Wt Readings from Last 3 Encounters:  12/19/15 109 lb (49.4 kg)  11/13/15 111 lb 3.2 oz (50.4 kg)  10/16/15 111 lb (50.3 kg)    General:  Elderly female who looks younger than her stated age. She is in no acute distress.  She is very tearful today and less engaging in conversation. She has lost 2 more pounds.  HEENT: Normal.  Neck: Supple, no JVD, carotid bruits, or masses noted.  Cardiac: Fairly regular rhythm today.  No edema.  Respiratory:  Lungs are clear to auscultation bilaterally with normal work of breathing.  GI: Soft and nontender.  MS: No deformity or atrophy. Gait and ROM intact.  Skin: Warm and dry. Color is normal.  Neuro:  Strength and sensation are intact and no gross focal deficits noted.  Psych: Alert, appropriate and with normal affect.   LABORATORY DATA:  EKG:  EKG is not ordered today.  Lab Results  Component Value Date   WBC 7.4 11/13/2015   HGB 12.3 11/13/2015   HCT 37.3 11/13/2015   PLT 267 11/13/2015   GLUCOSE 79 11/13/2015   CHOL 197 08/13/2015   TRIG 70 08/13/2015   HDL 87 08/13/2015   LDLCALC 96 08/13/2015   ALT 18 11/13/2015   AST 29 11/13/2015   NA 139 11/13/2015   K 4.8 11/13/2015   CL 103 11/13/2015   CREATININE 1.13 (H) 11/13/2015   BUN 26 (H) 11/13/2015   CO2 25 11/13/2015   TSH 2.56 11/13/2015   INR 0.99 11/09/2013   HGBA1C 5.6 08/13/2015   MICROALBUR 1.1 05/09/2015    BNP (last 3 results) No results for input(s): BNP in the last 8760 hours.  ProBNP (last 3 results) No results for input(s): PROBNP in the last 8760 hours.   Other Studies Reviewed Today:  Echo Study Conclusions from 04/2012  -  Left ventricle: The cavity size was normal. Wall thickness was normal. Systolic function was normal. The estimated ejection fraction was in the range of 60% to 65%. Features are consistent with a pseudonormal left ventricular filling pattern, with concomitant abnormal relaxation and increased filling pressure (grade 2 diastolic dysfunction). - Mitral valve: Mild regurgitation. - Pulmonary arteries: Systolic pressure was moderately increased. PA peak pressure: 54mm Hg (S).  Assessment/Plan: 1. HTN - I have left her on her current dose of Lisinopril.   2. Syncope - not recurred.   3. Advanced age   60. PAF - not a candidate for anticoagulation given age and unsteadiness.   5. Situational stress - may need to consider antidepressant therapy. Daughter is planning on getting palliative care back involved.   Current medicines are reviewed with the patient today.  The patient does not have concerns regarding medicines other than what has been noted above.  The following changes have been made:  See above.  Labs/ tests ordered today include:   No orders of the defined types were placed in this encounter.    Disposition:   FU with me in 4 months.   Patient is agreeable to this plan and will call if any problems develop in the interim.   Signed: Rosalio Macadamia, RN, ANP-C 12/19/2015 1:54 PM  [facility] [address] [place], [place]  [address] Phone: [phone] Fax: [phone]

## 2015-12-21 ENCOUNTER — Encounter: Payer: Self-pay | Admitting: *Deleted

## 2015-12-21 ENCOUNTER — Telehealth: Payer: Self-pay | Admitting: *Deleted

## 2015-12-21 DIAGNOSIS — M545 Low back pain: Secondary | ICD-10-CM | POA: Diagnosis not present

## 2015-12-21 MED ORDER — DULOXETINE HCL 60 MG PO CPEP: 60.0000 mg | ORAL_CAPSULE | Freq: Every day | ORAL | 11 refills | Status: DC

## 2015-12-21 MED ORDER — PREDNISONE 20 MG PO TABS: 20.0000 mg | ORAL_TABLET | Freq: Every day | ORAL | 0 refills | Status: DC

## 2015-12-21 NOTE — Telephone Encounter (Signed)
Error   This encounter was created in error - please disregard. 

## 2015-12-21 NOTE — Telephone Encounter (Signed)
Pallitive Care NP called and reported the patient is having neck and low back pain along with neuropathy pain in her legs.  She states the patient is in so much pain she is constantly crying.  The NP asked for a change in her pain medications and a referral to Dr Lovell Sheehan, who has done back surgery on the patient.  Per Dr Oneta Rack, she can increase her Tramadol 50 mg to 1-2 tablets qid(no RX needed at this time).  She is to continue Lyrica 50 mg daily and a new RX for Cymbalta 60 mg sent to [facility].  Per Dr Oneta Rack, it is OK to refer to Dr Lovell Sheehan.

## 2016-01-02 DIAGNOSIS — M545 Low back pain: Secondary | ICD-10-CM | POA: Diagnosis not present

## 2016-01-02 DIAGNOSIS — S233XXA Sprain of ligaments of thoracic spine, initial encounter: Secondary | ICD-10-CM | POA: Diagnosis not present

## 2016-01-02 DIAGNOSIS — M25552 Pain in left hip: Secondary | ICD-10-CM | POA: Diagnosis not present

## 2016-01-02 DIAGNOSIS — S20212A Contusion of left front wall of thorax, initial encounter: Secondary | ICD-10-CM | POA: Diagnosis not present

## 2016-01-04 ENCOUNTER — Emergency Department (HOSPITAL_COMMUNITY): Payer: Medicare Other

## 2016-01-04 ENCOUNTER — Other Ambulatory Visit: Payer: Self-pay | Admitting: Internal Medicine

## 2016-01-04 ENCOUNTER — Observation Stay (HOSPITAL_COMMUNITY)
Admission: EM | Admit: 2016-01-04 | Discharge: 2016-01-06 | Disposition: A | Discharge status: Home / Self Care | Payer: Medicare Other | Attending: Internal Medicine | Admitting: Internal Medicine

## 2016-01-04 ENCOUNTER — Encounter (HOSPITAL_COMMUNITY): Payer: Self-pay | Admitting: Emergency Medicine

## 2016-01-04 DIAGNOSIS — K219 Gastro-esophageal reflux disease without esophagitis: Secondary | ICD-10-CM | POA: Diagnosis present

## 2016-01-04 DIAGNOSIS — Z5181 Encounter for therapeutic drug level monitoring: Secondary | ICD-10-CM | POA: Diagnosis not present

## 2016-01-04 DIAGNOSIS — I10 Essential (primary) hypertension: Secondary | ICD-10-CM | POA: Diagnosis present

## 2016-01-04 DIAGNOSIS — R4182 Altered mental status, unspecified: Principal | ICD-10-CM | POA: Insufficient documentation

## 2016-01-04 DIAGNOSIS — R55 Syncope and collapse: Secondary | ICD-10-CM | POA: Diagnosis not present

## 2016-01-04 DIAGNOSIS — R531 Weakness: Secondary | ICD-10-CM | POA: Diagnosis not present

## 2016-01-04 DIAGNOSIS — Z7982 Long term (current) use of aspirin: Secondary | ICD-10-CM | POA: Diagnosis not present

## 2016-01-04 DIAGNOSIS — I440 Atrioventricular block, first degree: Secondary | ICD-10-CM | POA: Diagnosis present

## 2016-01-04 DIAGNOSIS — E039 Hypothyroidism, unspecified: Secondary | ICD-10-CM | POA: Diagnosis not present

## 2016-01-04 DIAGNOSIS — Z79899 Other long term (current) drug therapy: Secondary | ICD-10-CM | POA: Diagnosis not present

## 2016-01-04 DIAGNOSIS — I951 Orthostatic hypotension: Secondary | ICD-10-CM | POA: Diagnosis present

## 2016-01-04 DIAGNOSIS — I4891 Unspecified atrial fibrillation: Secondary | ICD-10-CM | POA: Diagnosis present

## 2016-01-04 DIAGNOSIS — R0602 Shortness of breath: Secondary | ICD-10-CM

## 2016-01-04 DIAGNOSIS — R402421 Glasgow coma scale score 9-12, in the field [EMT or ambulance]: Secondary | ICD-10-CM | POA: Diagnosis not present

## 2016-01-04 DIAGNOSIS — E782 Mixed hyperlipidemia: Secondary | ICD-10-CM | POA: Diagnosis present

## 2016-01-04 LAB — DIFFERENTIAL
BASOS PCT: 0 %
Basophils Absolute: 0.1 10*3/uL (ref 0.0–0.1)
EOS PCT: 3 %
Eosinophils Absolute: 0.6 10*3/uL (ref 0.0–0.7)
Lymphocytes Relative: 19 %
Lymphs Abs: 3.6 10*3/uL (ref 0.7–4.0)
MONO ABS: 2.1 10*3/uL — AB (ref 0.1–1.0)
MONOS PCT: 11 %
NEUTROS ABS: 12.2 10*3/uL — AB (ref 1.7–7.7)
Neutrophils Relative %: 67 %

## 2016-01-04 LAB — COMPREHENSIVE METABOLIC PANEL
ALK PHOS: 71 U/L (ref 38–126)
ALT: 30 U/L (ref 14–54)
ANION GAP: 9 (ref 5–15)
AST: 32 U/L (ref 15–41)
Albumin: 3.7 g/dL (ref 3.5–5.0)
BUN: 22 mg/dL — AB (ref 6–20)
CHLORIDE: 98 mmol/L — AB (ref 101–111)
CO2: 26 mmol/L (ref 22–32)
Calcium: 9.1 mg/dL (ref 8.9–10.3)
Creatinine, Ser: 1.31 mg/dL — ABNORMAL HIGH (ref 0.44–1.00)
GFR calc non Af Amer: 33 mL/min — ABNORMAL LOW (ref >60)
GFR, EST AFRICAN AMERICAN: 38 mL/min — AB (ref >60)
Glucose, Bld: 108 mg/dL — ABNORMAL HIGH (ref 65–99)
POTASSIUM: 4.7 mmol/L (ref 3.5–5.1)
SODIUM: 133 mmol/L — AB (ref 135–145)
TOTAL PROTEIN: 6.4 g/dL — AB (ref 6.5–8.1)
Total Bilirubin: 0.9 mg/dL (ref 0.3–1.2)

## 2016-01-04 LAB — PROTIME-INR
INR: 1.06
PROTHROMBIN TIME: 13.8 s (ref 11.4–15.2)

## 2016-01-04 LAB — CBC
HEMATOCRIT: 42.3 % (ref 36.0–46.0)
HEMOGLOBIN: 13.8 g/dL (ref 12.0–15.0)
MCH: 30.6 pg (ref 26.0–34.0)
MCHC: 32.6 g/dL (ref 30.0–36.0)
MCV: 93.8 fL (ref 78.0–100.0)
Platelets: 257 10*3/uL (ref 150–400)
RBC: 4.51 MIL/uL (ref 3.87–5.11)
RDW: 13.2 % (ref 11.5–15.5)
WBC: 17.1 10*3/uL — AB (ref 4.0–10.5)

## 2016-01-04 LAB — HEMOGLOBIN AND HEMATOCRIT, BLOOD
HEMATOCRIT: 37.4 % (ref 36.0–46.0)
HEMATOCRIT: 36.7 % (ref 36.0–46.0)
HEMOGLOBIN: 12.8 g/dL (ref 12.0–15.0)
Hemoglobin: 12.4 g/dL (ref 12.0–15.0)

## 2016-01-04 LAB — TYPE AND SCREEN
ABO/RH(D): O NEG
ANTIBODY SCREEN: NEGATIVE

## 2016-01-04 LAB — URINALYSIS, ROUTINE W REFLEX MICROSCOPIC
BILIRUBIN URINE: NEGATIVE
Glucose, UA: NEGATIVE mg/dL
HGB URINE DIPSTICK: NEGATIVE
KETONES UR: NEGATIVE mg/dL
Leukocytes, UA: NEGATIVE
NITRITE: NEGATIVE
Protein, ur: NEGATIVE mg/dL
SPECIFIC GRAVITY, URINE: 1.009 (ref 1.005–1.030)
pH: 6.5 (ref 5.0–8.0)

## 2016-01-04 LAB — I-STAT TROPONIN, ED: TROPONIN I, POC: 0 ng/mL (ref 0.00–0.08)

## 2016-01-04 LAB — CBG MONITORING, ED: Glucose-Capillary: 112 mg/dL — ABNORMAL HIGH (ref 65–99)

## 2016-01-04 LAB — TSH: TSH: 3.364 u[IU]/mL (ref 0.350–4.500)

## 2016-01-04 LAB — I-STAT CG4 LACTIC ACID, ED: Lactic Acid, Venous: 0.77 mmol/L (ref 0.5–1.9)

## 2016-01-04 MED ORDER — VITAMIN D3 25 MCG (1000 UNIT) PO TABS
1000.0000 [IU] | ORAL_TABLET | Freq: Two times a day (BID) | ORAL | Status: DC
  Administered 2016-01-04 – 2016-01-06 (×3): 1000 [IU] via ORAL
  Filled 2016-01-04 (×3): qty 1

## 2016-01-04 MED ORDER — ASPIRIN EC 81 MG PO TBEC
81.0000 mg | DELAYED_RELEASE_TABLET | Freq: Every day | ORAL | Status: DC
  Administered 2016-01-04 – 2016-01-06 (×2): 81 mg via ORAL
  Filled 2016-01-04 (×2): qty 1

## 2016-01-04 MED ORDER — DULOXETINE HCL 60 MG PO CPEP
60.0000 mg | ORAL_CAPSULE | Freq: Every day | ORAL | Status: DC
  Administered 2016-01-04 – 2016-01-05 (×2): 60 mg via ORAL
  Filled 2016-01-04 (×2): qty 1

## 2016-01-04 MED ORDER — ONDANSETRON HCL 4 MG/2ML IJ SOLN
4.0000 mg | Freq: Four times a day (QID) | INTRAMUSCULAR | Status: DC | PRN
Start: 2016-01-04 — End: 2016-01-06

## 2016-01-04 MED ORDER — PROSIGHT PO TABS
1.0000 | ORAL_TABLET | Freq: Two times a day (BID) | ORAL | Status: DC
  Administered 2016-01-04 – 2016-01-06 (×3): 1 via ORAL
  Filled 2016-01-04 (×4): qty 1

## 2016-01-04 MED ORDER — TRAMADOL HCL 50 MG PO TABS
50.0000 mg | ORAL_TABLET | Freq: Four times a day (QID) | ORAL | Status: DC | PRN
  Administered 2016-01-04: 50 mg via ORAL
  Filled 2016-01-04: qty 1

## 2016-01-04 MED ORDER — LEVOFLOXACIN 500 MG PO TABS
500.0000 mg | ORAL_TABLET | Freq: Every day | ORAL | Status: AC
  Administered 2016-01-04: 500 mg via ORAL
  Filled 2016-01-04: qty 1

## 2016-01-04 MED ORDER — OXYBUTYNIN CHLORIDE 5 MG PO TABS: 5.0000 mg | ORAL_TABLET | Freq: Three times a day (TID) | ORAL | Status: DC | PRN

## 2016-01-04 MED ORDER — LEVOFLOXACIN 500 MG PO TABS: 250.0000 mg | ORAL_TABLET | Freq: Every day | ORAL | Status: DC

## 2016-01-04 MED ORDER — ONDANSETRON HCL 4 MG PO TABS: 4.0000 mg | ORAL_TABLET | Freq: Four times a day (QID) | ORAL | Status: DC | PRN

## 2016-01-04 MED ORDER — ACETAMINOPHEN 325 MG PO TABS
325.0000 mg | ORAL_TABLET | Freq: Four times a day (QID) | ORAL | Status: DC | PRN
Start: 2016-01-04 — End: 2016-01-06

## 2016-01-04 MED ORDER — LIDOCAINE 5 % EX PTCH
1.0000 | MEDICATED_PATCH | CUTANEOUS | Status: DC
  Administered 2016-01-04 – 2016-01-05 (×2): 1 via TRANSDERMAL
  Filled 2016-01-04 (×3): qty 1

## 2016-01-04 MED ORDER — PREGABALIN 50 MG PO CAPS
50.0000 mg | ORAL_CAPSULE | Freq: Every day | ORAL | Status: DC
  Administered 2016-01-04 – 2016-01-06 (×2): 50 mg via ORAL
  Filled 2016-01-04 (×2): qty 1

## 2016-01-04 MED ORDER — PANTOPRAZOLE SODIUM 40 MG PO TBEC
40.0000 mg | DELAYED_RELEASE_TABLET | Freq: Two times a day (BID) | ORAL | Status: DC
  Administered 2016-01-04 – 2016-01-06 (×3): 40 mg via ORAL
  Filled 2016-01-04 (×4): qty 1

## 2016-01-04 MED ORDER — SODIUM CHLORIDE 0.9 % IV SOLN
INTRAVENOUS | Status: DC
  Administered 2016-01-04 (×2): via INTRAVENOUS

## 2016-01-04 MED ORDER — BISACODYL 10 MG RE SUPP: 10.0000 mg | Freq: Every day | RECTAL | Status: DC | PRN

## 2016-01-04 MED ORDER — LEVOTHYROXINE SODIUM 50 MCG PO TABS
75.0000 ug | ORAL_TABLET | Freq: Every day | ORAL | Status: DC
  Administered 2016-01-05 – 2016-01-06 (×2): 75 ug via ORAL
  Filled 2016-01-04 (×2): qty 1

## 2016-01-04 MED ORDER — PHENYLEPHRINE IN HARD FAT 0.25 % RE SUPP
1.0000 | Freq: Two times a day (BID) | RECTAL | Status: DC
  Administered 2016-01-04 – 2016-01-05 (×2): 1 via RECTAL
  Filled 2016-01-04 (×4): qty 1

## 2016-01-04 MED ORDER — DOCUSATE SODIUM 100 MG PO CAPS
200.0000 mg | ORAL_CAPSULE | Freq: Two times a day (BID) | ORAL | Status: DC
  Administered 2016-01-04 – 2016-01-06 (×3): 200 mg via ORAL
  Filled 2016-01-04 (×4): qty 2

## 2016-01-04 MED ORDER — SODIUM CHLORIDE 0.9 % IV BOLUS (SEPSIS)
1000.0000 mL | Freq: Once | INTRAVENOUS | Status: AC
  Administered 2016-01-04: 1000 mL via INTRAVENOUS

## 2016-01-04 NOTE — ED Notes (Signed)
Pt still on bedpan at this time having bowel movement . Family at bedside.

## 2016-01-04 NOTE — Progress Notes (Signed)
Report was given to [facility] RN, pt transported to rm 1345, family present with patient upon transfer.

## 2016-01-04 NOTE — ED Notes (Signed)
Patient transported to CT 

## 2016-01-04 NOTE — ED Triage Notes (Signed)
Per EMS pt from home for evaluation of altered mental status and possible syncope that started tonight. Pt family concerned for increasing drowsiness and has reported pt has had recent increase in pain medications. Pt currently alert and oriented to person , place, time, and situation.

## 2016-01-04 NOTE — ED Provider Notes (Signed)
Medical screening examination/treatment/procedure(s) were conducted as a shared visit with non-physician practitioner(s) and myself.  I personally evaluated the patient during the encounter.   EKG Interpretation None     82 year old female here complaining of altered mental status as well as low-grade temperature. Patient does have a moderate leukocytosis here. Will be admitted to the medicine service   Lorre NickAnthony Donovon Micheletti, MD 01/04/16 [id number]

## 2016-01-04 NOTE — ED Notes (Signed)
Dr.Molpus notified of pt and presenting complaints.

## 2016-01-04 NOTE — H&P (Addendum)
TRH H&P   Patient Demographics:    [name], is a 82 y.o. female  MRN: [id number]   DOB - 09-13-1914  Admit Date - 01/04/2016  Outpatient Primary MD for the patient is Nadean Corwin, MD    Patient coming from: Home  Chief Complaint  Patient presents with  . Altered Mental Status      HPI:    [name]  is a 82 y.o. 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  Patient with above history who still lives at home with help from her daughter and granddaughter comes to the office with one week history of generalized weakness, mild cough, 2 episodes of fall. Apparently about 5 days ago patient had an episode of fall during which she hurt her left side of her rib cage, she was seen by PCP and orthopedics at that time and several x-rays were obtained which were unremarkable. She was placed on low-dose narcotics and sent home.  Ever since then she has continued to feel weak all over, this morning around 3 AM she had another episode of passing out where she was unresponsive for a few minutes but did not actually have a fall, she was then brought to the ER where she was found to be severely orthostatic and I was called to admit the patient. Workup showed mild leukocytosis, she did have a dry cough which was not productive, she does have chronic back pain but no change in that pattern. Currently review of systems is entirely unremarkable except altered for generalized weakness and some left-sided rib cage pain.    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  A full 10 point Review of Systems was done, except as stated above, all other Review of Systems were negative.   With Past History of the following :    Past Medical History:  Diagnosis Date  . Atrial fibrillation (HCC)    not a candidate for coumadin due to falls  . AV block, 1st degree   . CVD (cerebrovascular disease)    History of CVA  . GERD (gastroesophageal reflux disease)   . History of echocardiogram 9/11   normal EF, mild to mod MR & TR, mod pulmonary HTN  . Hypertension   . Hypothyroidism   . Osteoarthritis   . Pneumonia 09/11  . Prediabetes   . Syncope and collapse    Negative loop recorder in the past  . Traumatic enucleation of eye    left eye      Past Surgical History:  Procedure Laterality Date  . BACK SURGERY    . INGUINAL HERNIA REPAIR     left sided      Social History:     Social History  Substance Use Topics  . Smoking status: Never Smoker  . Smokeless tobacco: Never Used  . Alcohol use No         Family History :     Family History  Problem Relation Age of Onset  . Heart attack Father   . Cancer Brother   . Stroke Brother   . Heart attack Brother        Home Medications:   Prior to Admission medications   Medication Sig Start Date End Date Taking? Authorizing Provider  acetaminophen  (TYLENOL) 325 MG tablet Take 325 mg by mouth every 6 (six) hours as needed for headache.   Yes Historical Provider, MD  aspirin 81 MG tablet Take 81 mg by mouth at bedtime.    Yes Historical Provider, MD  beta carotene w/minerals (OCUVITE) tablet Take 1 tablet by mouth 2 (two) times daily.   Yes Historical Provider, MD  Cholecalciferol (VITAMIN D3) 1000 UNITS CAPS Take 1,000 Units by mouth 2 (two) times daily.    Yes Historical Provider, MD  DULoxetine (CYMBALTA) 60 MG capsule Take 1 capsule (60 mg total) by mouth daily. 12/21/15  Yes Lucky Cowboy, MD  Flaxseed, Linseed, (FLAX SEED OIL PO) Take 1 tablet by mouth 2 (two) times daily.    Yes Historical Provider, MD  hydrochlorothiazide (HYDRODIURIL) 25 MG tablet Take 0.5 tablets (12.5 mg total) by mouth as directed. 09/19/15  Yes Beatrice Lecher, PA-C  HYDROcodone-acetaminophen (NORCO/VICODIN) 5-325 MG tablet Take 1 tablet by mouth every 6 (six) hours as needed. 01/02/16  Yes Historical Provider, MD  lisinopril (PRINIVIL,ZESTRIL) 5 MG tablet Take 1 tablet (5 mg total) by mouth daily. 09/12/15  Yes Rosalio Macadamia, NP  MAGNESIUM PO Take 1 tablet by mouth daily.   Yes Historical Provider, MD  ondansetron (ZOFRAN-ODT) 8 MG disintegrating tablet Take 1 tablet by mouth as needed for nausea.  01/03/16  Yes Historical Provider, MD  OVER THE COUNTER MEDICATION Aleve PRN for mild pain.   Yes Historical Provider, MD  oxybutynin (DITROPAN) 5 MG tablet Take 1/2 to 1 tablet 1 to 3 x/daily as needed for urinary frequency 08/27/15  Yes Lucky Cowboy, MD  predniSONE (DELTASONE) 20 MG tablet Take 1 tablet (20 mg total) by mouth daily with breakfast. 12/21/15  Yes Lucky Cowboy, MD  pregabalin (LYRICA) 50 MG capsule Take 1 capsule (50 mg total) by mouth at bedtime. 09/27/15  Yes Courtney Forcucci, PA-C  SYNTHROID 75 MCG tablet TAKE 1 TABLET DAILY BEFORE BREAKFAST 10/09/15  Yes Quentin MullingAmanda Collier, PA-C  traMADol (ULTRAM) 50 MG tablet TAKE 1 TABLET BY MOUTH 3 TO 4 TIMES A DAY AS  NEEDED FOR PAIN 10/08/15  Yes Quentin MullingAmanda Collier, PA-C  triamcinolone (KENALOG) 0.025 % cream Apply 1 application topically as needed (for rash).    Yes Historical Provider, MD  ondansetron (ZOFRAN) 4 MG tablet Take 1 tablet (4 mg total) by mouth every 6 (six) hours. Patient not taking: Reported on 01/04/2016 06/28/14   Tilden FossaElizabeth Rees, MD     Allergies:     Allergies  Allergen Reactions  . Augmentin [Amoxicillin-Pot Clavulanate] Diarrhea  . Ciprocin-Fluocin-Procin [Fluocinolone Acetonide] Nausea And Vomiting  . Ciprofloxacin Hcl Nausea And Vomiting  . Codeine Hives and Nausea And Vomiting  . Sulfa Drugs Cross Reactors Hives and Nausea And Vomiting     Physical Exam:   Vitals  Blood pressure 152/86, pulse 90, temperature 100.1 F (37.8 C), temperature source Oral, resp. rate 17, height 5\' 2"  (1.575 m), weight 49.4 kg (109 lb), SpO2 97 %.   1. General Frail elderly white female lying in hospital bed in no apparent distress,  2. Normal affect and insight, Not Suicidal or Homicidal, Awake Alert, Oriented X 3.  3. No F.N deficits, ALL C.Nerves Intact, artificial left eye, Strength 5/5 all 4 extremities, Sensation intact all 4 extremities, Plantars down going.  4. Ears and Eyes appear Normal, Conjunctivae clear, PERRLA. Moist Oral Mucosa.  5. Supple Neck, No JVD, No cervical lymphadenopathy appriciated, No Carotid Bruits.  6. Symmetrical Chest wall movement, Good air movement bilaterally, CTAB. Left rib cage bruise,  7. RRR, No Gallops, Rubs or Murmurs, No Parasternal Heave.  8. Positive Bowel Sounds, Abdomen Soft, No tenderness, No organomegaly appriciated,No rebound -guarding or rigidity.  9.  No Cyanosis, Normal Skin Turgor, No Skin Rash or Bruise.  10. Good muscle tone,  joints appear normal , no effusions, Normal ROM.  11. No Palpable Lymph Nodes in Neck or Axillae      Data Review:    CBC  Recent Labs Lab 01/04/16 0355  WBC 17.1*  HGB 13.8  HCT 42.3  PLT 257  MCV  93.8  MCH 30.6  MCHC 32.6  RDW 13.2  LYMPHSABS 3.6  MONOABS 2.1*  EOSABS 0.6  BASOSABS 0.1   ------------------------------------------------------------------------------------------------------------------  Chemistries   Recent Labs Lab 01/04/16 0355  NA 133*  K 4.7  CL 98*  CO2 26  GLUCOSE 108*  BUN 22*  CREATININE 1.31*  CALCIUM 9.1  AST 32  ALT 30  ALKPHOS 71  BILITOT 0.9   ------------------------------------------------------------------------------------------------------------------ estimated creatinine clearance is 18.7 mL/min (by C-G formula based on SCr of 1.31 mg/dL). ------------------------------------------------------------------------------------------------------------------ No results for input(s): TSH, T4TOTAL, T3FREE, THYROIDAB in the last 72 hours.  Invalid input(s): FREET3  Coagulation profile  Recent Labs Lab 01/04/16 0355  INR 1.06   ------------------------------------------------------------------------------------------------------------------- No results for input(s): DDIMER in the last 72 hours. -------------------------------------------------------------------------------------------------------------------  Cardiac Enzymes No results for input(s): CKMB, TROPONINI, MYOGLOBIN in the last 168 hours.  Invalid input(s): CK ------------------------------------------------------------------------------------------------------------------    Component Value Date/Time   BNP 146.3 (H) 06/06/2011 1506     ---------------------------------------------------------------------------------------------------------------  Urinalysis    Component Value Date/Time   COLORURINE YELLOW 01/04/2016 0520   APPEARANCEUR CLEAR 01/04/2016 0520   LABSPEC 1.009 01/04/2016 0520   PHURINE  6.5 01/04/2016 0520   GLUCOSEU NEGATIVE 01/04/2016 0520   HGBUR NEGATIVE 01/04/2016 0520   BILIRUBINUR NEGATIVE 01/04/2016 0520   KETONESUR NEGATIVE 01/04/2016  0520   PROTEINUR NEGATIVE 01/04/2016 0520   UROBILINOGEN 0.2 03/23/2014 1621   NITRITE NEGATIVE 01/04/2016 0520   LEUKOCYTESUR NEGATIVE 01/04/2016 0520    ----------------------------------------------------------------------------------------------------------------   Imaging Results:    Dg Chest 2 View  Result Date: 01/04/2016 CLINICAL DATA:  Acute onset of altered mental status. Possible syncope. Recent fall. Initial encounter. EXAM: CHEST  2 VIEW COMPARISON:  Chest radiograph performed 03/17/2014 FINDINGS: The lungs are well-aerated. Right apical opacity may reflect scarring or possibly underlying mass, somewhat more prominent than on prior studies. There is no evidence of pleural effusion or pneumothorax. The heart is mildly enlarged. No acute osseous abnormalities are seen. IMPRESSION: 1. Right apical opacity may reflect scarring or underlying mass, somewhat more prominent than on prior studies. Further evaluation may be considered if deemed clinically appropriate. Mild left basilar atelectasis noted. 2. Mild cardiomegaly. Electronically Signed   By: Roanna Raider M.D.   On: 01/04/2016 06:27   Ct Head Wo Contrast  Result Date: 01/04/2016 CLINICAL DATA:  Fall in bathroom with altered mental status EXAM: CT HEAD WITHOUT CONTRAST TECHNIQUE: Contiguous axial images were obtained from the base of the skull through the vertex without intravenous contrast. COMPARISON:  06/28/2014 FINDINGS: Bony calvarium is intact. Mild atrophic changes are noted. Basal ganglia calcifications are seen. No findings to suggest acute hemorrhage, acute infarction or space-occupying mass lesion are noted. IMPRESSION: Mild atrophic changes without acute abnormality. Electronically Signed   By: Alcide Clever M.D.   On: 01/04/2016 07:17    My personal review of EKG: RhythmA. fib, rate mid 80s , no Acute ST changes   Assessment & Plan:      1. Syncope due to severe orthostatic hypotension from dehydration. Will be  admitted to MedSurg bed, IV fluid bolus and hydration, hold ACE inhibitor and HCTZ, monitor orthostatics initiate PT.  2. Chronic atrial fibrillation. [place] vasc 2 score of at least 5. Not on rate controlling medications on anticoagulation due to fall risk, currently in rate control. Monitor.  3. Left-sided rib cage bruise due to injury with pain. Apply lidocaine patch and low-dose narcotic.  4. Hypothyroidism. Continue home dose Synthroid and check TSH.  5. Overactive bladder. Continue the Ditropan.  6. Essential hypertension. Blood pressure soft, patient is orthostatic. Hydrate and hold blood pressure medications.  7. History of CVA. No residual deficits, continue aspirin for secondary prevention.  8. ARF due to severe dehydration. Hold ACE inhibitor, HCTZ, hydrate and monitor. Baseline creatinine is close to 1.  9.URI with dry cough and leukocytosis.3 days of PO Levaquin and monitor.  10. Right apical opacity - somewhat chronic, outpatient PCP guided age-appropriate workup.   Addendum. 4 hours after admission patient had a bowel movement mixed with blood, likely hemorrhoidal bleed, monitor H&H, place on PPI, no EGD or colonoscopy discussed with daughter patient is 61 years old and likely will not tolerate these procedures. Type screen done transfuse if needed. Note she is on aspirin. If BUN rises discontinue aspirin.     DVT Prophylaxis  SCDs   AM Labs Ordered, also please review Full Orders  Family Communication: Admission, patients condition and plan of care including tests being ordered have been discussed with the patient and family who indicate understanding and agree with the plan and Code Status.  Code Status DNR  Likely DC to  Home 1-2  days  Condition fair  Consults called: None    Admission status: Obs    Time spent in minutes : 30   SINGH,PRASHANT K M.D on [address] [place] 10:02 AM  Between 7am to 7pm - Pager - [phone]. After 7pm go to [email or web address] -  password [facility]  [facility] - Office  [phone]

## 2016-01-04 NOTE — ED Notes (Signed)
Bed: [id number] Expected date:  Expected time:  Means of arrival:  Comments: EMS 82 yo female from home/syncopal episode, fell in bathroom-lethargic

## 2016-01-05 ENCOUNTER — Observation Stay (HOSPITAL_COMMUNITY): Payer: Medicare Other

## 2016-01-05 DIAGNOSIS — R55 Syncope and collapse: Secondary | ICD-10-CM | POA: Diagnosis not present

## 2016-01-05 DIAGNOSIS — R0602 Shortness of breath: Secondary | ICD-10-CM | POA: Diagnosis not present

## 2016-01-05 LAB — BASIC METABOLIC PANEL
Anion gap: 6 (ref 5–15)
BUN: 17 mg/dL (ref 6–20)
CALCIUM: 7.5 mg/dL — AB (ref 8.9–10.3)
CO2: 22 mmol/L (ref 22–32)
CREATININE: 1 mg/dL (ref 0.44–1.00)
Chloride: 104 mmol/L (ref 101–111)
GFR calc Af Amer: 53 mL/min — ABNORMAL LOW (ref >60)
GFR, EST NON AFRICAN AMERICAN: 45 mL/min — AB (ref >60)
GLUCOSE: 100 mg/dL — AB (ref 65–99)
Potassium: 4.1 mmol/L (ref 3.5–5.1)
Sodium: 132 mmol/L — ABNORMAL LOW (ref 135–145)

## 2016-01-05 LAB — CBC
HEMATOCRIT: 35.1 % — AB (ref 36.0–46.0)
Hemoglobin: 11.9 g/dL — ABNORMAL LOW (ref 12.0–15.0)
MCH: 32 pg (ref 26.0–34.0)
MCHC: 33.9 g/dL (ref 30.0–36.0)
MCV: 94.4 fL (ref 78.0–100.0)
PLATELETS: 208 10*3/uL (ref 150–400)
RBC: 3.72 MIL/uL — ABNORMAL LOW (ref 3.87–5.11)
RDW: 13.6 % (ref 11.5–15.5)
WBC: 23.1 10*3/uL — ABNORMAL HIGH (ref 4.0–10.5)

## 2016-01-05 LAB — URINE CULTURE: Culture: NO GROWTH

## 2016-01-05 LAB — MAGNESIUM: MAGNESIUM: 1.7 mg/dL (ref 1.7–2.4)

## 2016-01-05 LAB — TSH: TSH: 2.441 u[IU]/mL (ref 0.350–4.500)

## 2016-01-05 LAB — ABO/RH: ABO/RH(D): O NEG

## 2016-01-05 MED ORDER — SACCHAROMYCES BOULARDII 250 MG PO CAPS
250.0000 mg | ORAL_CAPSULE | Freq: Two times a day (BID) | ORAL | Status: DC
  Administered 2016-01-05 – 2016-01-06 (×2): 250 mg via ORAL
  Filled 2016-01-05 (×2): qty 1

## 2016-01-05 MED ORDER — SODIUM CHLORIDE 0.9 % IV SOLN
1.5000 g | Freq: Two times a day (BID) | INTRAVENOUS | Status: DC
  Administered 2016-01-05 – 2016-01-06 (×2): 1.5 g via INTRAVENOUS
  Filled 2016-01-05 (×3): qty 1.5

## 2016-01-05 MED ORDER — SODIUM CHLORIDE 0.9 % IV SOLN
INTRAVENOUS | Status: AC
  Administered 2016-01-05: 10:00:00 via INTRAVENOUS

## 2016-01-05 MED ORDER — LOPERAMIDE HCL 2 MG PO CAPS: 2.0000 mg | ORAL_CAPSULE | Freq: Four times a day (QID) | ORAL | Status: DC | PRN

## 2016-01-05 NOTE — Evaluation (Signed)
Physical Therapy Evaluation Patient Details Name: [name] MRN: [id number] DOB: 02/10/1915 Today's Date: 01/05/2016   History of Present Illness  [name]  is a 82 y.o. 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  Clinical Impression  Pt admitted with above diagnosis. Pt currently with functional limitations due to the deficits listed below (see PT Problem List). * Pt will benefit from skilled PT to increase their independence and safety with mobility to allow discharge to the venue listed below.    Pt amb 170' with min assist and RW, has 24 hr care per family; will follow in acute setting    Follow Up Recommendations No PT follow up;Other (comment) (pt grand-dtr states pt wil not need f/u HHPT)    Equipment Recommendations  None recommended by PT    Recommendations for Other Services       Precautions / Restrictions Precautions Precautions: Fall      Mobility  Bed Mobility               General bed mobility comments: NT --pt in chair with nursing  Transfers Overall transfer level: Needs assistance Equipment used: Rolling walker (2 wheeled) Transfers: Sit to/from Stand Sit to Stand: Min assist         General transfer comment: assist to steady with standing  Ambulation/Gait Ambulation/Gait assistance: Min assist Ambulation Distance (Feet): 170 Feet Assistive device: Rolling walker (2 wheeled) Gait Pattern/deviations: Step-through pattern;Trunk flexed     General Gait Details: cues for RW safety and direction, occasional assist for balance and to maneuver RW; slightly unsteady but without overt LOB  Stairs            Wheelchair Mobility    Modified Rankin (Stroke Patients Only)       Balance Overall balance assessment: Needs assistance;History of Falls         Standing balance support: No upper extremity supported Standing  balance-Leahy Scale: Fair Standing balance comment: wide BOS, incr postural sway                             Pertinent Vitals/Pain Pain Assessment: No/denies pain    Home Living Family/patient expects to be discharged to:: Private residence Living Arrangements: Alone (grandchildren stay with her) Available Help at Discharge: Family;Available 24 hours/day Type of Home: House Home Access: Stairs to enter   [employer] CorporationEntrance Stairs-Number of Steps: 2 Home Layout: One level Home Equipment: Walker - 4 wheels;Wheelchair - manual      Prior Function Level of Independence: Independent         Comments: pt cooking and cleaning her own home until fall last Sunday     Hand Dominance        Extremity/Trunk Assessment   Upper Extremity Assessment: Overall WFL for tasks assessed           Lower Extremity Assessment: Generalized weakness         Communication   Communication: HOH  Cognition Arousal/Alertness: Awake/alert Behavior During Therapy: WFL for tasks assessed/performed                        General Comments      Exercises        Assessment/Plan    PT Assessment Patient needs continued PT services  PT Diagnosis Difficulty walking   PT Problem List Decreased strength;Decreased range  of motion;Decreased activity tolerance;Decreased balance;Decreased mobility;Decreased knowledge of use of DME  PT Treatment Interventions DME instruction;Gait training;Functional mobility training;Therapeutic exercise;Therapeutic activities;Balance training   PT Goals (Current goals can be found in the Care Plan section) Acute Rehab PT Goals Patient Stated Goal: home PT Goal Formulation: With patient Time For Goal Achievement: 01/12/16 Potential to Achieve Goals: Good    Frequency Min 3X/week   Barriers to discharge        Co-evaluation               End of Session Equipment Utilized During Treatment: Gait belt Activity Tolerance: Patient  tolerated treatment well Patient left: in chair;with call bell/phone within reach;with chair alarm set;with family/visitor present      Functional Assessment Tool Used: clinical judgement Functional Limitation: Mobility: Walking and moving around Mobility: Walking and Moving Around Current Status [phone]): At least 1 percent but less than 20 percent impaired, limited or restricted Mobility: Walking and Moving Around Goal Status [phone]): At least 1 percent but less than 20 percent impaired, limited or restricted    Time: 1446-1457 PT Time Calculation (min) (ACUTE ONLY): 11 min   Charges:   PT Evaluation $PT Eval Low Complexity: 1 Procedure     PT G Codes:   PT G-Codes **NOT FOR INPATIENT CLASS** Functional Assessment Tool Used: clinical judgement Functional Limitation: Mobility: Walking and moving around Mobility: Walking and Moving Around Current Status (W2956): At least 1 percent but less than 20 percent impaired, limited or restricted Mobility: Walking and Moving Around Goal Status [phone]): At least 1 percent but less than 20 percent impaired, limited or restricted    [facility] 01/05/2016, 4:45 PM

## 2016-01-05 NOTE — Evaluation (Signed)
Clinical/Bedside Swallow Evaluation Patient Details  Name: [name] MRN: [id number] Date of Birth: 03/20/1915  Today's Date: 01/05/2016 Time: SLP Start Time (ACUTE ONLY): 1700 SLP Stop Time (ACUTE ONLY): 1730 SLP Time Calculation (min) (ACUTE ONLY): 30 min  Past Medical History:  Past Medical History:  Diagnosis Date  . Atrial fibrillation (HCC)    not a candidate for coumadin due to falls  . AV block, 1st degree   . CVD (cerebrovascular disease)    History of CVA  . GERD (gastroesophageal reflux disease)   . History of echocardiogram 9/11   normal EF, mild to mod MR & TR, mod pulmonary HTN  . Hypertension   . Hypothyroidism   . Osteoarthritis   . Pneumonia 09/11  . Prediabetes   . Syncope and collapse    Negative loop recorder in the past  . Traumatic enucleation of eye    left eye   Past Surgical History:  Past Surgical History:  Procedure Laterality Date  . BACK SURGERY    . INGUINAL HERNIA REPAIR     left sided   HPI:  82[facility], hypot[facility], - diagnosed with possible left lobe pna= aspiration.    Pt denies dysphagia but reports occasional difficulty swallowing large pills and has some reflux issues.  Per family, her intake of liquids is poor and dehydration is a chronic issue.  Pt denies recurrent pnas.     Assessment / Plan / Recommendation Clinical Impression  Pt presents with functional oropharyngeal swallow ability based on clinical swallow evaluation.  Negative CN exam and pt able to self feed which aids airway protection.  She was waiting for Chic[employer]A, so she took only small amount for evaluation.  Pt masticated solids well and ate slowly.  No indication of airway compromise noted.  Belching observed post=swallow of carbonated beverage and pt does admit to occasional issues with reflux.  She denies choking episode or reflux/vomiting prior to admit.  Per family, pt does not consume adequate liquid at any time and does not drink liquids  with meals. Advised her to start meals with liquids d/t xerostomia and consume liquid t/o meal.  Provided pt/family with written/verbal dysphagia mitigation strategies and heimlich maneuver.  No SLP follow up indicated as pt with functional swallow and education completed with pt, granddaughter *whom resides with pt and grandson.  Thanks for this order on this most pleasant pt.     Aspiration Risk  Mild aspiration risk    Diet Recommendation Regular;Thin liquid   Liquid Administration via: Cup;Straw Medication Administration:  (as tolerated, large pills with pudding if problematic) Supervision: Patient able to self feed Compensations: Slow rate;Small sips/bites (start meals with liquids, consume liquids t/o meal) Postural Changes: Seated upright at 90 degrees;Remain upright for at least 30 minutes after po intake    Other  Recommendations     Follow up Recommendations       Frequency and Duration            Prognosis        Swallow Study   General Date of Onset: 01/05/16 HPI: 82[facility], hypotension, - diagnosed with possible left lobe pna= aspiration.    Pt denies dysphagia but reports occasional difficulty swallowing large pills and has some reflux issues.  Per family, her intake of liquids is poor and dehydration is a chronic issue.  Pt denies recurrent pnas.   Type of Study: Bedside Swallow Evaluation Diet Prior to  this Study: Regular;Thin liquids Temperature Spikes Noted: No Respiratory Status: Room air History of Recent Intubation: No Behavior/Cognition: Alert;Cooperative;Other (Comment);Requires cueing (HOH) Oral Cavity Assessment: Dry Oral Care Completed by SLP: No Oral Cavity - Dentition: Dentures, top;Dentures, bottom Vision: Functional for self-feeding Self-Feeding Abilities: Able to feed self Patient Positioning: Upright in bed Baseline Vocal Quality: Normal Volitional Cough: Strong Volitional Swallow: Unable to elicit (pt xerostomic)     Oral/Motor/Sensory Function Overall Oral Motor/Sensory Function: Within functional limits   Ice Chips Ice chips: Not tested   Thin Liquid Thin Liquid: Impaired Presentation: Cup;Self Fed;Straw Pharyngeal  Phase Impairments: Multiple swallows Other Comments: belch noted post=carbonated swallow    Nectar Thick Nectar Thick Liquid: Not tested   Honey Thick Honey Thick Liquid: Not tested   Puree Puree: Within functional limits Presentation: Self Fed;Spoon   Solid   GO   Solid: Within functional limits Presentation: Self Fed    Functional Assessment Tool Used: clinical judgement Functional Limitations: Swallowing Swallow Current Status (Z6109): At least 1 percent but less than 20 percent impaired, limited or restricted Swallow Goal Status [phone]): At least 1 percent but less than 20 percent impaired, limited or restricted Swallow Discharge Status [phone]): At least 1 percent but less than 20 percent impaired, limited or restricted  Donavan Burnet, MS [facility] SLP [phone]

## 2016-01-05 NOTE — Progress Notes (Signed)
Pharmacy Antibiotic Note  [name] is a 82 y.o. female admitted on 01/04/2016 with aspiration pneumonia.  Pharmacy has been consulted for ampicillin/sulbactam dosing. History of diarrhea to amox/clav - MD aware.  CXR LLL infiltrate  Today, 01/05/2016  Renal: Scr improved overnight  WBC elevated  Tm 100.1  Plan:  Amp/sulbactam 1.5gm IV q12h based on est CrCl and body wt  Height: 5\' 2"  (157.5 cm) Weight: 108 lb (49 kg) IBW/kg (Calculated) : 50.1  Temp (24hrs), Avg:98.2 F (36.8 C), Min:97.5 F (36.4 C), Max:100.1 F (37.8 C)   Recent Labs Lab 01/04/16 0355 01/04/16 0811 01/05/16 0236  WBC 17.1*  --  23.1*  CREATININE 1.31*  --  1.00  LATICACIDVEN  --  0.77  --     Estimated Creatinine Clearance: 24.3 mL/min (by C-G formula based on SCr of 1 mg/dL).    Allergies  Allergen Reactions  . Augmentin [Amoxicillin-Pot Clavulanate] Diarrhea  . Ciprocin-Fluocin-Procin [Fluocinolone Acetonide] Nausea And Vomiting  . Ciprofloxacin Hcl Nausea And Vomiting  . Codeine Hives and Nausea And Vomiting  . Sulfa Drugs Cross Reactors Hives and Nausea And Vomiting    Antimicrobials this admission: Amp/sulb 8/12 >>  Dose adjustments this admission:   Microbiology results: 8/11 UCx  Thank you for allowing pharmacy to be a part of this patient's care.  Juliette Alcideustin Mayrene Bastarache, PharmD, BCPS.   Pager: [phone] 01/05/2016 8:16 AM

## 2016-01-05 NOTE — Progress Notes (Signed)
PROGRESS NOTE                                                                                                                                                                                                             Patient Demographics:    [name], is a 82 y.o. female, DOB - 06/14/1914, ZOX:[id number]  Admit date - 01/04/2016   Admitting Physician Leroy Sea, MD  Outpatient Primary MD for the patient is Nadean Corwin, MD  LOS - 0  Chief Complaint  Patient presents with  . Altered Mental Status       Brief Narrative    [name]  is a 82 y.o. 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  Patient with above history who still lives at home with help from her daughter and granddaughter comes to the office with one week history of generalized weakness, mild cough, 2 episodes of fall. Apparently about 5 days ago patient had an episode of fall during which she hurt her left side of her rib cage, she was seen by PCP and orthopedics at that time and several x-rays were obtained which were unremarkable. She was placed on low-dose narcotics and sent home.  Ever since then she has continued to feel weak all over, this morning around 3 AM she had another episode of passing out where she was unresponsive for a few minutes but did not actually have a fall, she was then brought to the ER where she was found to be severely orthostatic and I was called to admit the patient. Workup showed mild leukocytosis, she did have a dry cough which was not productive, she does have chronic back pain but no change in that pattern. Currently review of systems is entirely unremarkable except altered for generalized weakness and some left-sided rib cage  pain.    Subjective:    [name] today has, No headache, No chest pain, No abdominal pain - No Nausea, No new weakness tingling or numbness, No Cough - SOB. Feels better.   Assessment  & Plan :      1. Syncope due to severe orthostatic hypotension from dehydration. Much better with IV fluid  bolus and hydration - continue,  hold ACE inhibitor and HCTZ, monitor orthostatics initiate PT and advance activity.  2. Chronic atrial fibrillation. [place] vasc 2 score of at least 5. Not on rate controlling medications on anticoagulation due to fall risk, currently in rate control. Monitor.  3. Left-sided rib cage bruise due to injury with pain. Apply lidocaine patch and low-dose narcotic.  4. Hypothyroidism. Continue home dose Synthroid and check TSH.  5. Overactive bladder. Continue the Ditropan.  6. Essential hypertension. Blood pressure soft, patient is orthostatic. Hydrate and hold blood pressure medications.  7. History of CVA. No residual deficits, continue aspirin for secondary prevention.  8. ARF due to severe dehydration. Hold ACE inhibitor, HCTZ, hydrate and monitor. Baseline creatinine is close to 1.  9. Lung opacities. Suspicious for possible silent aspiration, switched to Unasyn, speech eval and feeding assistance, aspiration precaution, she has no shortness of breath, continue to monitor. Will need repeat 2 view chest x-ray in 2 weeks by PCP.  10. Hemorrhoid related blood mixed stool on 01/04/2016 morning. Stable H&H when accounted for mild dilution due to 3 L of IV fluids, placed on PPI, steroid suppositories ordered. Patient feels better.    Family Communication  :  Daughter and grand daughter  Code Status :  DNR  Diet : Heart Healthy  Disposition Plan  :  Home in am  Consults  :  Speech  Procedures  :  None  DVT Prophylaxis  :  SCDs    Lab Results  Component Value Date   PLT 208 01/05/2016    Inpatient Medications  Scheduled Meds: . aspirin EC   81 mg Oral QHS  . cholecalciferol  1,000 Units Oral BID  . docusate sodium  200 mg Oral BID  . DULoxetine  60 mg Oral Daily  . levothyroxine  75 mcg Oral QAC breakfast  . lidocaine  1 patch Transdermal Q24H  . multivitamin  1 tablet Oral BID  . pantoprazole  40 mg Oral BID  . phenylephrine  1 suppository Rectal BID  . pregabalin  50 mg Oral QHS  . saccharomyces boulardii  250 mg Oral BID   Continuous Infusions: . sodium chloride     PRN Meds:.acetaminophen, bisacodyl, loperamide, ondansetron **OR** ondansetron (ZOFRAN) IV, oxybutynin, traMADol  Antibiotics  :    Anti-infectives    Start     Dose/Rate Route Frequency Ordered Stop   01/05/16 1000  levofloxacin (LEVAQUIN) tablet 250 mg  Status:  Discontinued     250 mg Oral Daily 01/04/16 1133 01/05/16 0807   01/04/16 1200  levofloxacin (LEVAQUIN) tablet 500 mg     500 mg Oral Daily 01/04/16 1126 01/04/16 1249         Objective:   Vitals:   01/04/16 1138 01/04/16 2115 01/05/16 0432 01/05/16 0650  BP: (!) 160/64 (!) 117/57 123/60   Pulse: 81 95 79   Resp: 18 16 16    Temp: 97.6 F (36.4 C) 97.5 F (36.4 C) 97.6 F (36.4 C)   TempSrc: Oral Oral Oral   SpO2: 96% 96% 96% 95%  Weight: 49 kg (108 lb)     Height: 5\' 2"  (1.575 m)       Wt Readings from Last 3 Encounters:  01/04/16 49 kg (108 lb)  12/19/15 49.4 kg (109 lb)  11/13/15 50.4 kg (111 lb 3.2 oz)     Intake/Output Summary (Last 24 hours) at 01/05/16 0807 Last data filed at 01/05/16 0622  Gross per 24 hour  Intake  2095.75 ml  Output                0 ml  Net          2095.75 ml     Physical Exam  Awake Alert, Oriented X 3, No new F.N deficits, Normal affect [place].AT,PERRAL Supple Neck,No JVD, No cervical lymphadenopathy appriciated.  Symmetrical Chest wall movement, Good air movement bilaterally, CTAB RRR,No Gallops,Rubs or new Murmurs, No Parasternal Heave +ve B.Sounds, Abd Soft, No tenderness, No organomegaly appriciated, No rebound - guarding  or rigidity. No Cyanosis, Clubbing or edema, No new Rash or bruise      Data Review:    CBC  Recent Labs Lab 01/04/16 0355 01/04/16 1446 01/04/16 2014 01/05/16 0236  WBC 17.1*  --   --  23.1*  HGB 13.8 12.8 12.4 11.9*  HCT 42.3 37.4 36.7 35.1*  PLT 257  --   --  208  MCV 93.8  --   --  94.4  MCH 30.6  --   --  32.0  MCHC 32.6  --   --  33.9  RDW 13.2  --   --  13.6  LYMPHSABS 3.6  --   --   --   MONOABS 2.1*  --   --   --   EOSABS 0.6  --   --   --   BASOSABS 0.1  --   --   --     Chemistries   Recent Labs Lab 01/04/16 0355 01/05/16 0236  NA 133* 132*  K 4.7 4.1  CL 98* 104  CO2 26 22  GLUCOSE 108* 100*  BUN 22* 17  CREATININE 1.31* 1.00  CALCIUM 9.1 7.5*  MG  --  1.7  AST 32  --   ALT 30  --   ALKPHOS 71  --   BILITOT 0.9  --    ------------------------------------------------------------------------------------------------------------------ No results for input(s): CHOL, HDL, LDLCALC, TRIG, CHOLHDL, LDLDIRECT in the last 72 hours.  Lab Results  Component Value Date   HGBA1C 5.6 08/13/2015   ------------------------------------------------------------------------------------------------------------------  Recent Labs  01/05/16 0236  TSH 2.441   ------------------------------------------------------------------------------------------------------------------ No results for input(s): VITAMINB12, FOLATE, FERRITIN, TIBC, IRON, RETICCTPCT in the last 72 hours.  Coagulation profile  Recent Labs Lab 01/04/16 0355  INR 1.06    No results for input(s): DDIMER in the last 72 hours.  Cardiac Enzymes No results for input(s): CKMB, TROPONINI, MYOGLOBIN in the last 168 hours.  Invalid input(s): CK ------------------------------------------------------------------------------------------------------------------    Component Value Date/Time   BNP 146.3 (H) 06/06/2011 1506    Micro Results No results found for this or any previous visit (from the  past 240 hour(s)).  Radiology Reports Dg Chest 2 View  Result Date: 01/04/2016 CLINICAL DATA:  Acute onset of altered mental status. Possible syncope. Recent fall. Initial encounter. EXAM: CHEST  2 VIEW COMPARISON:  Chest radiograph performed 03/17/2014 FINDINGS: The lungs are well-aerated. Right apical opacity may reflect scarring or possibly underlying mass, somewhat more prominent than on prior studies. There is no evidence of pleural effusion or pneumothorax. The heart is mildly enlarged. No acute osseous abnormalities are seen. IMPRESSION: 1. Right apical opacity may reflect scarring or underlying mass, somewhat more prominent than on prior studies. Further evaluation may be considered if deemed clinically appropriate. Mild left basilar atelectasis noted. 2. Mild cardiomegaly. Electronically Signed   By: Roanna Raider M.D.   On: 01/04/2016 06:27   Ct Head Wo Contrast  Result Date: 01/04/2016 CLINICAL DATA:  Fall in bathroom with altered mental status EXAM: CT HEAD WITHOUT CONTRAST TECHNIQUE: Contiguous axial images were obtained from the base of the skull through the vertex without intravenous contrast. COMPARISON:  06/28/2014 FINDINGS: Bony calvarium is intact. Mild atrophic changes are noted. Basal ganglia calcifications are seen. No findings to suggest acute hemorrhage, acute infarction or space-occupying mass lesion are noted. IMPRESSION: Mild atrophic changes without acute abnormality. Electronically Signed   By: Alcide Clever M.D.   On: 01/04/2016 07:17   Dg Chest Port 1 View  Result Date: 01/05/2016 CLINICAL DATA:  Shortness of breath EXAM: PORTABLE CHEST 1 VIEW COMPARISON:  Chest radiograph from one day prior. FINDINGS: Stable cardiomediastinal silhouette with top-normal heart size and aortic atherosclerosis. No pneumothorax. No pleural effusion. Patchy opacity at the left lateral lung base appears increased. No pulmonary edema. IMPRESSION: Increase patchy lateral left lung base opacity,  worrisome for aspiration or pneumonia. Aortic atherosclerosis. Electronically Signed   By: Delbert Phenix M.D.   On: 01/05/2016 07:15    Time Spent in minutes  30   SINGH,PRASHANT K M.D on 01/05/2016 at 8:07 AM  Between 7am to 7pm - Pager - [phone]  After 7pm go to [email or web address] - password [facility]  [facility] -  Office  [phone]

## 2016-01-06 ENCOUNTER — Observation Stay (HOSPITAL_COMMUNITY): Payer: Medicare Other

## 2016-01-06 DIAGNOSIS — R0602 Shortness of breath: Secondary | ICD-10-CM | POA: Diagnosis not present

## 2016-01-06 DIAGNOSIS — R55 Syncope and collapse: Secondary | ICD-10-CM | POA: Diagnosis not present

## 2016-01-06 LAB — CBC
HEMATOCRIT: 35.4 % — AB (ref 36.0–46.0)
Hemoglobin: 11.8 g/dL — ABNORMAL LOW (ref 12.0–15.0)
MCH: 31.5 pg (ref 26.0–34.0)
MCHC: 33.3 g/dL (ref 30.0–36.0)
MCV: 94.4 fL (ref 78.0–100.0)
PLATELETS: 209 10*3/uL (ref 150–400)
RBC: 3.75 MIL/uL — ABNORMAL LOW (ref 3.87–5.11)
RDW: 13.6 % (ref 11.5–15.5)
WBC: 24.5 10*3/uL — ABNORMAL HIGH (ref 4.0–10.5)

## 2016-01-06 MED ORDER — SACCHAROMYCES BOULARDII 250 MG PO CAPS
250.0000 mg | ORAL_CAPSULE | Freq: Two times a day (BID) | ORAL | 0 refills | Status: DC
Start: 2016-01-06 — End: 2016-01-21

## 2016-01-06 MED ORDER — PHENYLEPHRINE IN HARD FAT 0.25 % RE SUPP: 1.0000 | Freq: Two times a day (BID) | RECTAL | 0 refills | Status: DC | PRN

## 2016-01-06 MED ORDER — DOCUSATE SODIUM 100 MG PO CAPS: 100.0000 mg | ORAL_CAPSULE | Freq: Two times a day (BID) | ORAL | 0 refills | Status: DC

## 2016-01-06 MED ORDER — AMOXICILLIN-POT CLAVULANATE 500-125 MG PO TABS: 1.0000 | ORAL_TABLET | Freq: Two times a day (BID) | ORAL | 0 refills | Status: DC

## 2016-01-06 NOTE — Discharge Instructions (Signed)
Follow with Primary MD MCKEOWN,WILLIAM DAVID, MD in 7 days   Get CBC, CMP, 2 view Chest X ray checked  by Primary MD or SNF MD in 5-7 days ( we routinely change or add medications that can affect your baseline labs and fluid status, therefore we recommend that you get the mentioned basic workup next visit with your PCP, your PCP may decide not to get them or add new tests based on their clinical decision)   Activity: As tolerated with Full fall precautions use walker/cane & assistance as needed   Disposition Home    Diet:   Heart Healthy with feeding assistance and aspiration precautions.  For Heart failure patients - Check your Weight same time everyday, if you gain over 2 pounds, or you develop in leg swelling, experience more shortness of breath or chest pain, call your Primary MD immediately. Follow Cardiac Low Salt Diet and 1.5 lit/day fluid restriction.   On your next visit with your primary care physician please Get Medicines reviewed and adjusted.   Please request your Prim.MD to go over all Hospital Tests and Procedure/Radiological results at the follow up, please get all Hospital records sent to your Prim MD by signing hospital release before you go home.   If you experience worsening of your admission symptoms, develop shortness of breath, life threatening emergency, suicidal or homicidal thoughts you must seek medical attention immediately by calling 911 or calling your MD immediately  if symptoms less severe.  You Must read complete instructions/literature along with all the possible adverse reactions/side effects for all the Medicines you take and that have been prescribed to you. Take any new Medicines after you have completely understood and accpet all the possible adverse reactions/side effects.   Do not drive, operate heavy machinery, perform activities at heights, swimming or participation in water activities or provide baby sitting services if your were admitted for  syncope or siezures until you have seen by Primary MD or a Neurologist and advised to do so again.  Do not drive when taking Pain medications.    Do not take more than prescribed Pain, Sleep and Anxiety Medications  Special Instructions: If you have smoked or chewed Tobacco  in the last 2 yrs please stop smoking, stop any regular Alcohol  and or any Recreational drug use.  Wear Seat belts while driving.   Please note  You were cared for by a hospitalist during your hospital stay. If you have any questions about your discharge medications or the care you received while you were in the hospital after you are discharged, you can call the unit and asked to speak with the hospitalist on call if the hospitalist that took care of you is not available. Once you are discharged, your primary care physician will handle any further medical issues. Please note that NO REFILLS for any discharge medications will be authorized once you are discharged, as it is imperative that you return to your primary care physician (or establish a relationship with a primary care physician if you do not have one) for your aftercare needs so that they can reassess your need for medications and monitor your lab values.

## 2016-01-06 NOTE — Discharge Summary (Signed)
[name] ZOX:[id number] DOB: 05/11/1915 DOA: 01/04/2016  PCP: Anne CorwinMCKEOWN,WILLIAM DAVID, MD  Admit date: 01/04/2016  Discharge date: 01/06/2016  Admitted From: Home   Disposition:  Home   Recommendations for Outpatient Follow-up:   Follow up with PCP in 1-2 weeks  PCP Please obtain BMP/CBC, 2 view CXR in 1week,  (see Discharge instructions)   PCP Please follow up on the following pending results: Check CBC, BMP and a 2 view CXR in 1-2 weeks   Home Health: None ( family refused)    Equipment/Devices: None  Consultations: None Discharge Condition: Fair   CODE STATUS: DNR   Diet Recommendation: Heart Healthy   Chief Complaint  Patient presents with  . 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  Patient with above history who still lives at home with help from her daughter and granddaughter comes to the office with one week history of generalized weakness, mild cough, 2 episodes of fall. Apparently about 5 days ago patient had an episode of fall during which she hurt her left side of her rib cage, she was seen by PCP and orthopedics at that time and several x-rays were obtained which were unremarkable. She was placed on low-dose narcotics and sent home.  Ever since then she has continued to feel weak all over, this morning around 3 AM she had another episode of passing out where she was unresponsive for a few minutes but did not actually have a fall, she was then brought to the ER where she  was found to be severely orthostatic and I was called to admit the patient. Workup showed mild leukocytosis, she did have a dry cough which was not productive, she does have chronic back pain but no change in that pattern. Currently review of systems is entirely unremarkable except altered for generalized weakness and some left-sided rib cage pain.   Hospital issues addressed    1. Syncope due to severe orthostatic hypotension from dehydration. Much better with IV fluid bolus and hydration - continue,  hold ACE inhibitor and HCTZ, BP now stable, not orthostatic anymore, seen by PT family does not want HHPT, DC home, PCP to check BP in 1 week.  2.Chronic atrial fibrillation. [place] vasc 2 score of at least 5. Not on rate controlling medications on anticoagulation due to fall risk, currently in rate control. Monitor.  3.Left-sided rib cage bruise due to injury with pain. Symptom free now with supportive Rx.  4.Hypothyroidism. Continue home dose Synthroid and stable TSH.  5.Overactive bladder. Continue the Ditropan.  6.Essential hypertension. Blood pressure soft, patient was ++ orthostatic. Hydrated and DC'd home HCTZ-ARB combination.  7.History of CVA. No residual deficits, continue aspirin for secondary prevention.  8.ARF due to  severe dehydration. Hold ACE inhibitor, HCTZ, hydrated, at baseline now.   9. Lung opacities. Suspicious for possible silent aspiration during syncope at home, clinically responded to Unasyn, speech eval done and paseed, she is afebrile , no cough, no shortness of breath, place on a short Augmentin course, ++WBC likely due to PNA + recent steroid use at home. Will need repeat 2 view chest x-ray in 1-2 weeks by PCP.  10. Hemorrhoid related blood mixed stool on 01/04/2016 morning. Stable H&H when accounted for mild dilution due to 3 L of IV fluids, steroid suppositories ordered with resolution of her symptoms. Placed on bowel regimen as well.   Discharge  diagnosis     Principal Problem:   Syncope and collapse Active Problems:   Hypertension   AV block, 1st degree   Hypothyroidism   Atrial fibrillation (HCC)   GERD (gastroesophageal reflux disease)   Hyperlipidemia   Orthostatic hypotension   Syncope    Discharge instructions    Discharge Instructions    Diet - low sodium heart healthy    Complete by:  As directed   Discharge instructions    Complete by:  As directed   Follow with Primary MD MCKEOWN,WILLIAM DAVID, MD in 7 days   Get CBC, CMP, 2 view Chest X ray checked  by Primary MD or SNF MD in 5-7 days ( we routinely change or add medications that can affect your baseline labs and fluid status, therefore we recommend that you get the mentioned basic workup next visit with your PCP, your PCP may decide not to get them or add new tests based on their clinical decision)   Activity: As tolerated with Full fall precautions use walker/cane & assistance as needed   Disposition Home    Diet:   Heart Healthy with feeding assistance and aspiration precautions.  For Heart failure patients - Check your Weight same time everyday, if you gain over 2 pounds, or you develop in leg swelling, experience more shortness of breath or chest pain, call your Primary MD immediately. Follow Cardiac Low Salt Diet and 1.5 lit/day fluid restriction.   On your next visit with your primary care physician please Get Medicines reviewed and adjusted.   Please request your Prim.MD to go over all Hospital Tests and Procedure/Radiological results at the follow up, please get all Hospital records sent to your Prim MD by signing hospital release before you go home.   If you experience worsening of your admission symptoms, develop shortness of breath, life threatening emergency, suicidal or homicidal thoughts you must seek medical attention immediately by calling 911 or calling your MD immediately  if symptoms less severe.  You Must read complete  instructions/literature along with all the possible adverse reactions/side effects for all the Medicines you take and that have been prescribed to you. Take any new Medicines after you have completely understood and accpet all the possible adverse reactions/side effects.   Do not drive, operate heavy machinery, perform activities at heights, swimming or participation in water activities or provide baby sitting services if your were admitted for syncope or siezures until you have seen by Primary MD or a Neurologist and advised to do so again.  Do not drive when taking Pain medications.    Do not take more than prescribed Pain, Sleep and Anxiety Medications  Special Instructions: If you have smoked or chewed Tobacco  in the last 2 yrs please stop smoking, stop any regular Alcohol  and or any Recreational drug use.  Wear Seat belts while driving.   Please note  You were cared for by a hospitalist during your hospital stay. If you have any questions about your discharge medications or the care you received while you were in the hospital after you are discharged, you can call the unit and asked to speak with the hospitalist on call if the hospitalist that took care of you is not available. Once you are discharged, your primary care physician will handle any further medical issues. Please note that NO REFILLS for any discharge medications will be authorized once you are discharged, as it is imperative that you return to your primary care physician (or establish a relationship with a primary care physician if you do not have one) for your aftercare needs so that they can reassess your need for medications and monitor your lab values.   Increase activity slowly    Complete by:  As directed      Discharge Medications     Medication List    STOP taking these medications   hydrochlorothiazide 25 MG tablet Commonly known as:  HYDRODIURIL   predniSONE 20 MG tablet Commonly known as:  DELTASONE      TAKE these medications   acetaminophen 325 MG tablet Commonly known as:  TYLENOL Take 325 mg by mouth every 6 (six) hours as needed for headache.   amoxicillin-clavulanate 500-125 MG tablet Commonly known as:  AUGMENTIN Take 1 tablet (500 mg total) by mouth 2 (two) times daily.   aspirin 81 MG tablet Take 81 mg by mouth at bedtime.   beta carotene w/minerals tablet Take 1 tablet by mouth 2 (two) times daily.   docusate sodium 100 MG capsule Commonly known as:  COLACE Take 1 capsule (100 mg total) by mouth 2 (two) times daily.   DULoxetine 60 MG capsule Commonly known as:  CYMBALTA Take 1 capsule (60 mg total) by mouth daily.   FLAX SEED OIL PO Take 1 tablet by mouth 2 (two) times daily.   HYDROcodone-acetaminophen 5-325 MG tablet Commonly known as:  NORCO/VICODIN Take 1 tablet by mouth every 6 (six) hours as needed.   lisinopril 5 MG tablet Commonly known as:  PRINIVIL,ZESTRIL Take 1 tablet (5 mg total) by mouth daily.   MAGNESIUM PO Take 1 tablet by mouth daily.   ondansetron 4 MG tablet Commonly known as:  ZOFRAN Take 1 tablet (4 mg total) by mouth every 6 (six) hours.   ondansetron 8 MG disintegrating tablet Commonly known as:  ZOFRAN-ODT Take 1 tablet by mouth as needed for nausea.   OVER THE COUNTER MEDICATION Aleve PRN for mild pain.   oxybutynin 5 MG tablet Commonly known as:  DITROPAN Take 1/2 to 1 tablet 1 to 3 x/daily as needed for urinary frequency   phenylephrine 0.25 % suppository Commonly known as:  (USE for PREPARATION-H) Place 1 suppository rectally 2 (two) times daily as needed for hemorrhoids.   pregabalin 50 MG capsule Commonly known as:  LYRICA Take 1 capsule (50 mg total) by mouth at bedtime.   saccharomyces boulardii 250 MG capsule Commonly known as:  FLORASTOR Take 1 capsule (250 mg total) by mouth 2 (two) times daily.   SYNTHROID 75 MCG tablet Generic drug:  levothyroxine TAKE 1 TABLET DAILY BEFORE BREAKFAST   traMADol 50  MG tablet Commonly known as:  ULTRAM TAKE 1 TABLET BY MOUTH 3 TO 4 TIMES A DAY AS NEEDED FOR PAIN   triamcinolone 0.025 % cream Commonly known as:  KENALOG Apply 1  application topically as needed (for rash).   Vitamin D3 1000 units Caps Take 1,000 Units by mouth 2 (two) times daily.       Follow-up Information    MCKEOWN,WILLIAM DAVID, MD. Schedule an appointment as soon as possible for a visit in 1 week(s).   Specialty:  Internal Medicine Contact information: [address] [place] [address] [phone]           Major procedures and Radiology Reports - PLEASE review detailed and final reports thoroughly  -        Dg Chest 2 View  Result Date: 01/04/2016 CLINICAL DATA:  Acute onset of altered mental status. Possible syncope. Recent fall. Initial encounter. EXAM: CHEST  2 VIEW COMPARISON:  Chest radiograph performed 03/17/2014 FINDINGS: The lungs are well-aerated. Right apical opacity may reflect scarring or possibly underlying mass, somewhat more prominent than on prior studies. There is no evidence of pleural effusion or pneumothorax. The heart is mildly enlarged. No acute osseous abnormalities are seen. IMPRESSION: 1. Right apical opacity may reflect scarring or underlying mass, somewhat more prominent than on prior studies. Further evaluation may be considered if deemed clinically appropriate. Mild left basilar atelectasis noted. 2. Mild cardiomegaly. Electronically Signed   By: Roanna Raider M.D.   On: 01/04/2016 06:27   Ct Head Wo Contrast  Result Date: 01/04/2016 CLINICAL DATA:  Fall in bathroom with altered mental status EXAM: CT HEAD WITHOUT CONTRAST TECHNIQUE: Contiguous axial images were obtained from the base of the skull through the vertex without intravenous contrast. COMPARISON:  06/28/2014 FINDINGS: Bony calvarium is intact. Mild atrophic changes are noted. Basal ganglia calcifications are seen. No findings to suggest acute hemorrhage,  acute infarction or space-occupying mass lesion are noted. IMPRESSION: Mild atrophic changes without acute abnormality. Electronically Signed   By: Alcide Clever M.D.   On: 01/04/2016 07:17   Dg Chest Port 1 View  Result Date: 01/06/2016 CLINICAL DATA:  Shortness of breath EXAM: PORTABLE CHEST 1 VIEW COMPARISON:  Chest radiograph from one day prior. FINDINGS: Stable cardiomediastinal silhouette with mild cardiomegaly. No pneumothorax. Stable small left pleural effusion. No right pleural effusion. No overt pulmonary edema. Mild bibasilar lung opacities appear stable. IMPRESSION: 1. Stable mild cardiomegaly without overt pulmonary edema. 2. Stable small left pleural effusion. 3. Stable mild bibasilar lung opacities. Electronically Signed   By: Delbert Phenix M.D.   On: 01/06/2016 07:19   Dg Chest Port 1 View  Result Date: 01/05/2016 CLINICAL DATA:  Shortness of breath EXAM: PORTABLE CHEST 1 VIEW COMPARISON:  Chest radiograph from one day prior. FINDINGS: Stable cardiomediastinal silhouette with top-normal heart size and aortic atherosclerosis. No pneumothorax. No pleural effusion. Patchy opacity at the left lateral lung base appears increased. No pulmonary edema. IMPRESSION: Increase patchy lateral left lung base opacity, worrisome for aspiration or pneumonia. Aortic atherosclerosis. Electronically Signed   By: Delbert Phenix M.D.   On: 01/05/2016 07:15    Micro Results    Recent Results (from the past 240 hour(s))  Urine culture     Status: None   Collection Time: 01/04/16  5:20 AM  Result Value Ref Range Status   Specimen Description URINE, CLEAN CATCH  Final   Special Requests NONE  Final   Culture NO GROWTH Performed at [facility]   Final   Report Status 01/05/2016 FINAL  Final    Today   Subjective    [name] today has no headache,no chest abdominal pain,no new weakness tingling or numbness,  feels much better wants to go home today.    Objective   Blood pressure 119/65,  pulse 82, temperature 99.7 F (37.6 C), temperature source Axillary, resp. rate 16, height 5\' 2"  (1.575 m), weight 49.9 kg (110 lb), SpO2 95 %.   Intake/Output Summary (Last 24 hours) at 01/06/16 0736 Last data filed at 01/06/16 0649  Gross per 24 hour  Intake             1515 ml  Output              402 ml  Net             1113 ml    Exam Awake Alert, Oriented x 3, No new F.N deficits, Normal affect [place].AT,PERRAL Supple Neck,No JVD, No cervical lymphadenopathy appriciated.  Symmetrical Chest wall movement, Good air movement bilaterally, CTAB RRR,No Gallops,Rubs or new Murmurs, No Parasternal Heave +ve B.Sounds, Abd Soft, Non tender, No organomegaly appriciated, No rebound -guarding or rigidity. No Cyanosis, Clubbing or edema, No new Rash or bruise   Data Review   CBC w Diff: Lab Results  Component Value Date   WBC 24.5 (H) 01/06/2016   HGB 11.8 (L) 01/06/2016   HCT 35.4 (L) 01/06/2016   PLT 209 01/06/2016   LYMPHOPCT 19 01/04/2016   MONOPCT 11 01/04/2016   EOSPCT 3 01/04/2016   BASOPCT 0 01/04/2016    CMP: Lab Results  Component Value Date   NA 132 (L) 01/05/2016   K 4.1 01/05/2016   CL 104 01/05/2016   CO2 22 01/05/2016   BUN 17 01/05/2016   CREATININE 1.00 01/05/2016   CREATININE 1.13 (H) 11/13/2015   PROT 6.4 (L) 01/04/2016   ALBUMIN 3.7 01/04/2016   BILITOT 0.9 01/04/2016   ALKPHOS 71 01/04/2016   AST 32 01/04/2016   ALT 30 01/04/2016  .   Total Time in preparing paper work, data evaluation and todays exam - 35 minutes  Leroy Sea M.D on 01/06/2016 at 7:36 AM  [facility]   Office  [phone]

## 2016-01-06 NOTE — Progress Notes (Signed)
Patient discharged to home, all discharge medications and instructions reviewed and questions answered.  Patient to be assisted to vehicle by wheelchair.  

## 2016-01-07 NOTE — Progress Notes (Signed)
Patient daughter called stating patient was given prescription for medication she was allergic to - Augmentin. She explained that patient had been having diarrhea.  I spoke with Dr. Thedore MinsSingh and he stated patient received Unasyn in hospital and was switched to the oral equivalent Augmentin for discharge.  He recommended yogurt and imodium and to contact primary MD if symptoms persisted.  When I gave this information to the daughter she stated that she could not take Augmentin due to diarrhea and she was having severe diarrhea before discharge. I spoke with the nurse who cared for the patient day of discharge and there was no diarrhea noted.  She stated she explained to the patient and daughter to take the pro-biotic to help with stomach issues from the antibiotic.  I recommended to the patient daughter that they contact the primary MD if the Augmentin was not tolerated with the Imodium and yogurt recommended by Dr. Thedore MinsSingh.

## 2016-01-10 ENCOUNTER — Ambulatory Visit (INDEPENDENT_AMBULATORY_CARE_PROVIDER_SITE_OTHER): Payer: Medicare Other | Admitting: Internal Medicine

## 2016-01-10 ENCOUNTER — Encounter: Payer: Self-pay | Admitting: Internal Medicine

## 2016-01-10 VITALS — BP 140/70 | HR 75 | Temp 98.1°F | Resp 14 | Ht 62.0 in | Wt 117.0 lb

## 2016-01-10 DIAGNOSIS — E860 Dehydration: Secondary | ICD-10-CM

## 2016-01-10 DIAGNOSIS — R55 Syncope and collapse: Secondary | ICD-10-CM | POA: Diagnosis not present

## 2016-01-10 DIAGNOSIS — Z79899 Other long term (current) drug therapy: Secondary | ICD-10-CM

## 2016-01-10 DIAGNOSIS — J189 Pneumonia, unspecified organism: Secondary | ICD-10-CM

## 2016-01-10 DIAGNOSIS — R400 Somnolence: Secondary | ICD-10-CM

## 2016-01-10 LAB — BASIC METABOLIC PANEL WITH GFR
BUN: 13 mg/dL (ref 7–25)
CALCIUM: 8.6 mg/dL (ref 8.6–10.4)
CO2: 25 mmol/L (ref 20–31)
CREATININE: 0.85 mg/dL (ref 0.60–0.88)
Chloride: 96 mmol/L — ABNORMAL LOW (ref 98–110)
GFR, EST AFRICAN AMERICAN: 66 mL/min (ref >=60)
GFR, EST NON AFRICAN AMERICAN: 57 mL/min — AB (ref >=60)
Glucose, Bld: 92 mg/dL (ref 65–99)
Potassium: 4.7 mmol/L (ref 3.5–5.3)
SODIUM: 130 mmol/L — AB (ref 135–146)

## 2016-01-10 LAB — CBC WITH DIFFERENTIAL/PLATELET
BASOS ABS: 0 {cells}/uL (ref 0–200)
Basophils Relative: 0 %
EOS PCT: 6 %
Eosinophils Absolute: 570 cells/uL — ABNORMAL HIGH (ref 15–500)
HCT: 36 % (ref 35.0–45.0)
Hemoglobin: 12.1 g/dL (ref 11.7–15.5)
Lymphocytes Relative: 19 %
Lymphs Abs: 1805 cells/uL (ref 850–3900)
MCH: 30.9 pg (ref 27.0–33.0)
MCHC: 33.6 g/dL (ref 32.0–36.0)
MCV: 92.1 fL (ref 80.0–100.0)
MONOS PCT: 15 %
MPV: 9.4 fL (ref 7.5–12.5)
Monocytes Absolute: 1425 cells/uL — ABNORMAL HIGH (ref 200–950)
NEUTROS ABS: 5700 {cells}/uL (ref 1500–7800)
NEUTROS PCT: 60 %
PLATELETS: 303 10*3/uL (ref 140–400)
RBC: 3.91 MIL/uL (ref 3.80–5.10)
RDW: 13.6 % (ref 11.0–15.0)
WBC: 9.5 10*3/uL (ref 3.8–10.8)

## 2016-01-10 NOTE — Progress Notes (Signed)
[facility]                       Lucky CowboyWilliam Lyric Rossano, M.D.        Dyanne CarrelAmanda R. Steffanie Dunnollier, P.A.-C       Terri Piedraourtney Forcucci, P.A.-C   [facility]                [address]                [place] [id number] Telephone [phone] Telefax [phone] ______________________________________________________________________     This very nice 82y.o.WWF presents for follow up post hospitalization 8/11-13/2017 with dehydration and was given IVF and released. Apparently she had a suspected syncopal episode which prompted her admission for observation. It was suspected she might have a CAP or aspiration pneumonitis and she was treated empirically with Augmentin and developed diarrhea post hospitalization and antibiotics were d/c'd.    Patient has been followed long term for Hypertension, Hyperlipidemia, Pre-Diabetes and Vitamin D Deficiency.      Patient is treated for HTN & BP has been controlled at home. Today's BP: 140/70. Patient has had no complaints of any cardiac type chest pain, palpitations, dyspnea/orthopnea/PND, dizziness, claudication, or dependent edema.     Hyperlipidemia has been controlled with diet & supplements.  Last Lipids were at goal.   Lab Results  Component Value Date   CHOL 197 08/13/2015   HDL 87 08/13/2015   LDLCALC 96 08/13/2015   TRIG 70 08/13/2015   CHOLHDL 2.3 08/13/2015      Also, the patient has history of PreDiabetes and has had no symptoms of reactive hypoglycemia, diabetic polys, paresthesias or visual blurring.  Last A1c was  Lab Results  Component Value Date   HGBA1C 5.6 08/13/2015      Further, the patient also has history of Vitamin D Deficiency and supplements vitamin D without any suspected side-effects. Last vitamin D was   Lab Results  Component Value Date   VD25OH 46 08/13/2015   Current Outpatient Prescriptions on File Prior to Visit  Medication Sig  . acetaminophen (TYLENOL)  325 MG tablet Take 325 mg by mouth every 6 (six) hours as needed for headache.  Marland Kitchen. aspirin 81 MG tablet Take 81 mg by mouth at bedtime.   . beta carotene w/minerals (OCUVITE) tablet Take 1 tablet by mouth 2 (two) times daily.  . Cholecalciferol (VITAMIN D3) 1000 UNITS CAPS Take 1,000 Units by mouth 2 (two) times daily.   . DULoxetine (CYMBALTA) 60 MG capsule Take 1 capsule (60 mg total) by mouth daily.  . Flaxseed, Linseed, (FLAX SEED OIL PO) Take 1 tablet by mouth 2 (two) times daily.   Marland Kitchen. lisinopril (PRINIVIL,ZESTRIL) 5 MG tablet Take 1 tablet (5 mg total) by mouth daily.  Marland Kitchen. MAGNESIUM PO Take 1 tablet by mouth daily.  . ondansetron (ZOFRAN) 4 MG tablet Take 1 tablet (4 mg total) by mouth every 6 (six) hours.  . ondansetron (ZOFRAN-ODT) 8 MG disintegrating tablet Take 1 tablet by mouth as needed for nausea.   Marland Kitchen. OVER THE COUNTER MEDICATION Aleve PRN for mild pain.  . phenylephrine (,USE FOR PREPARATION-H,) 0.25 % suppository Place 1 suppository rectally 2 (two) times daily as needed for hemorrhoids.  . pregabalin (LYRICA) 50 MG capsule Take 1 capsule (50 mg total) by mouth at bedtime.  . saccharomyces boulardii (FLORASTOR) 250 MG capsule Take 1 capsule (250 mg total) by mouth 2 (two) times daily.  .Marland Kitchen  SYNTHROID 75 MCG tablet TAKE 1 TABLET DAILY BEFORE BREAKFAST  . traMADol (ULTRAM) 50 MG tablet TAKE 1 TABLET BY MOUTH 3 TO 4 TIMES A DAY AS NEEDED FOR PAIN  . triamcinolone (KENALOG) 0.025 % cream Apply 1 application topically as needed (for rash).    No current facility-administered medications on file prior to visit.    Allergies  Allergen Reactions  . Augmentin [Amoxicillin-Pot Clavulanate] Diarrhea  . Ciprocin-Fluocin-Procin [Fluocinolone Acetonide] Nausea And Vomiting  . Ciprofloxacin Hcl Nausea And Vomiting  . Codeine Hives and Nausea And Vomiting  . Sulfa Drugs Cross Reactors Hives and Nausea And Vomiting   PMHx:   Past Medical History:  Diagnosis Date  . Atrial fibrillation (HCC)     not a candidate for coumadin due to falls  . AV block, 1st degree   . CVD (cerebrovascular disease)    History of CVA  . GERD (gastroesophageal reflux disease)   . History of echocardiogram 9/11   normal EF, mild to mod MR & TR, mod pulmonary HTN  . Hypertension   . Hypothyroidism   . Osteoarthritis   . Pneumonia 09/11  . Prediabetes   . Syncope and collapse    Negative loop recorder in the past  . Traumatic enucleation of eye    left eye   Immunization History  Administered Date(s) Administered  . Influenza Split 02/03/2013  . Influenza, High Dose Seasonal PF 02/22/2014, 02/07/2015  . Pneumococcal Conjugate-13 04/11/2014  . Pneumococcal-Unspecified 05/26/2009  . Td 03/23/2013  . Zoster 05/26/2005   Past Surgical History:  Procedure Laterality Date  . BACK SURGERY    . INGUINAL HERNIA REPAIR     left sided   FHx:    Reviewed / unchanged  SHx:    Reviewed / unchanged  Systems Review:  Constitutional: Denies fever, chills, wt changes, headaches, insomnia, fatigue, night sweats, change in appetite. Eyes: Denies redness, blurred vision, diplopia, discharge, itchy, watery eyes.  ENT: Denies discharge, congestion, post nasal drip, epistaxis, sore throat, earache, hearing loss, dental pain, tinnitus, vertigo, sinus pain, snoring.  CV: Denies chest pain, palpitations, irregular heartbeat, syncope, dyspnea, diaphoresis, orthopnea, PND, claudication or edema. Respiratory: denies cough, dyspnea, DOE, pleurisy, hoarseness, laryngitis, wheezing.  Gastrointestinal: Denies dysphagia, odynophagia, heartburn, reflux, water brash, abdominal pain or cramps, nausea, vomiting, bloating, diarrhea, constipation, hematemesis, melena, hematochezia  or hemorrhoids. Genitourinary: Denies dysuria, frequency, urgency, nocturia, hesitancy, discharge, hematuria or flank pain. Musculoskeletal: Denies arthralgias, myalgias, stiffness, jt. swelling, pain, limping or strain/sprain.  Skin: Denies pruritus,  rash, hives, warts, acne, eczema or change in skin lesion(s). Neuro: No weakness, tremor, incoordination, spasms, paresthesia or pain. Psychiatric: Denies confusion, memory loss or sensory loss. Endo: Denies change in weight, skin or hair change.  Heme/Lymph: No excessive bleeding, bruising or enlarged lymph nodes.  Physical Exam  BP 140/70   Pulse 75   Temp 98.1 F (36.7 C)   Resp 14   Ht 5\' 2"  (1.575 m)   Wt 117 lb (53.1 kg)   SpO2 97%   BMI 21.40 kg/m   Appears well nourished younger than chronological age and she's in no distress.  Eyes: PERRLA, EOMs, conjunctiva no swelling or erythema. Sinuses: No frontal/maxillary tenderness ENT/Mouth: EAC's clear, TM's nl w/o erythema, bulging. Nares clear w/o erythema, swelling, exudates. Oropharynx clear without erythema or exudates. Oral hygiene is good. Tongue normal, non obstructing. Hearing intact.  Neck: Supple. Thyroid nl. Car 2+/2+ without bruits, nodes or JVD. Chest: Respirations nl with BS clear & equal w/o rales,  rhonchi, wheezing or stridor.  Cor: Heart sounds normal w/ regular rate and rhythm without sig. murmurs, gallops, clicks, or rubs. Peripheral pulses normal and equal  without edema.  Abdomen: Soft & bowel sounds normal. Non-tender w/o guarding, rebound, hernias, masses, or organomegaly.  Lymphatics: Unremarkable.  Musculoskeletal: Full ROM all peripheral extremities, joint stability, 5/5 strength, and normal gait.  Skin: Warm, dry without exposed rashes, lesions or ecchymosis apparent.  Neuro: Cranial nerves intact, reflexes equal bilaterally. Sensory-motor testing grossly intact. Tendon reflexes grossly intact.    Assessment and Plan:   1. Syncope, unspecified syncope type  - Continue monitor blood pressure at home.  Reminder to go to the ER if any CP, SOB, nausea, dizziness, severe HA, changes vision/speech, left arm numbness and tingling and jaw pain.  2. Somnolence   3. Dehydration, resolved  4.  Pneumonitis  - CBC with Differential/Platelet  5. Medication management  - CBC with Differential/Platelet - BASIC METABOLIC PANEL WITH GFR .    Recommended regular exercise, BP monitoring, weight control, and discussed med and SE's. Recommended labs to assess and monitor clinical status. Further disposition pending results of labs. Over 30 minutes of exam, counseling, chart review was performed

## 2016-01-11 ENCOUNTER — Other Ambulatory Visit: Payer: Self-pay | Admitting: Internal Medicine

## 2016-01-11 ENCOUNTER — Other Ambulatory Visit: Payer: Self-pay | Admitting: *Deleted

## 2016-01-11 MED ORDER — LIDOCAINE 5 % EX PTCH: MEDICATED_PATCH | CUTANEOUS | 5 refills | Status: DC

## 2016-01-17 ENCOUNTER — Encounter (HOSPITAL_COMMUNITY): Payer: Self-pay | Admitting: Emergency Medicine

## 2016-01-17 ENCOUNTER — Emergency Department (HOSPITAL_COMMUNITY): Payer: Medicare Other

## 2016-01-17 ENCOUNTER — Emergency Department (HOSPITAL_COMMUNITY)
Admission: EM | Admit: 2016-01-17 | Discharge: 2016-01-17 | Disposition: A | Discharge status: Home / Self Care | Payer: Medicare Other | Attending: Emergency Medicine | Admitting: Emergency Medicine

## 2016-01-17 DIAGNOSIS — Z79899 Other long term (current) drug therapy: Secondary | ICD-10-CM | POA: Insufficient documentation

## 2016-01-17 DIAGNOSIS — I10 Essential (primary) hypertension: Secondary | ICD-10-CM | POA: Insufficient documentation

## 2016-01-17 DIAGNOSIS — Y939 Activity, unspecified: Secondary | ICD-10-CM | POA: Insufficient documentation

## 2016-01-17 DIAGNOSIS — Z8673 Personal history of transient ischemic attack (TIA), and cerebral infarction without residual deficits: Secondary | ICD-10-CM | POA: Insufficient documentation

## 2016-01-17 DIAGNOSIS — E039 Hypothyroidism, unspecified: Secondary | ICD-10-CM | POA: Diagnosis not present

## 2016-01-17 DIAGNOSIS — Y999 Unspecified external cause status: Secondary | ICD-10-CM | POA: Diagnosis not present

## 2016-01-17 DIAGNOSIS — W0110XA Fall on same level from slipping, tripping and stumbling with subsequent striking against unspecified object, initial encounter: Secondary | ICD-10-CM | POA: Diagnosis not present

## 2016-01-17 DIAGNOSIS — S8012XA Contusion of left lower leg, initial encounter: Secondary | ICD-10-CM | POA: Diagnosis not present

## 2016-01-17 DIAGNOSIS — Z7982 Long term (current) use of aspirin: Secondary | ICD-10-CM | POA: Diagnosis not present

## 2016-01-17 DIAGNOSIS — Y929 Unspecified place or not applicable: Secondary | ICD-10-CM | POA: Diagnosis not present

## 2016-01-17 DIAGNOSIS — S8992XA Unspecified injury of left lower leg, initial encounter: Secondary | ICD-10-CM | POA: Diagnosis present

## 2016-01-17 NOTE — ED Provider Notes (Signed)
[facility]-EMERGENCY DEPT Provider Note   CSN: [id number] Arrival date & time: 01/17/16  1128  By signing my name below, I, [name], attest that this documentation has been prepared under the direction and in the presence of [name], [place]. Electronically signed by: [name], ED Scribe. 01/17/16. 1:17 PM.   History   Chief Complaint Chief Complaint  Patient presents with  . Leg Injury   HPI HPI Comments:  [name] is a 82 y.o. female with a history of osteoarthritis, syncope and collapse who presents to the Emergency Department complaining of left lower leg pain, which started a couple hours ago. Pt reports that she opened her freezer and something fell out and hit her leg. Associated symptoms include a large hematoma to the affected area. No modifying factors reported. Denies difficulty ambulating. Pt is unaware when last tetanus shot was, but believes it has been within the past 10 years.  Past Medical History:  Diagnosis Date  . Atrial fibrillation (HCC)    not a candidate for coumadin due to falls  . AV block, 1st degree   . CVD (cerebrovascular disease)    History of CVA  . GERD (gastroesophageal reflux disease)   . History of echocardiogram 9/11   normal EF, mild to mod MR & TR, mod pulmonary HTN  . Hypertension   . Hypothyroidism   . Osteoarthritis   . Pneumonia 09/11  . Prediabetes   . Syncope and collapse    Negative loop recorder in the past  . Traumatic enucleation of eye    left eye    Patient Active Problem List   Diagnosis Date Noted  . Orthostatic hypotension 01/04/2016  . Syncope and collapse 01/04/2016  . Syncope 01/04/2016  . Malnutrition of mild degree (HCC) 11/13/2015  . Vitamin D deficiency 08/30/2014  . Medication management 08/30/2014  . Hyperlipidemia 06/29/2013  . Prediabetes   . GERD (gastroesophageal reflux disease)   . Hypertension   . AV block, 1st degree   . CVD (cerebrovascular disease)   . Osteoarthritis   .  Hypothyroidism   . Atrial fibrillation [facility](HCC)     Past Surgical History:  Procedure Laterality Date  . BACK SURGERY    . INGUINAL HERNIA REPAIR     left sided    OB History    No data available       Home Medications    Prior to Admission medications   Medication Sig Start Date End Date Taking? Authorizing Provider  acetaminophen (TYLENOL) 325 MG tablet Take 325 mg by mouth every 6 (six) hours as needed for headache.    Historical Provider, MD  aspirin 81 MG tablet Take 81 mg by mouth at bedtime.     Historical Provider, MD  beta carotene w/minerals (OCUVITE) tablet Take 1 tablet by mouth 2 (two) times daily.    Historical Provider, MD  Cholecalciferol (VITAMIN D3) 1000 UNITS CAPS Take 1,000 Units by mouth 2 (two) times daily.     Historical Provider, MD  DULoxetine (CYMBALTA) 60 MG capsule Take 1 capsule (60 mg total) by mouth daily. 12/21/15   Lucky CowboyWilliam McKeown, MD  Flaxseed, Linseed, (FLAX SEED OIL PO) Take 1 tablet by mouth 2 (two) times daily.     Historical Provider, MD  lidocaine (LIDODERM) 5 % Apply patch daily as needed for pain 01/11/16 07/13/16  Lucky CowboyWilliam McKeown, MD  lisinopril (PRINIVIL,ZESTRIL) 5 MG tablet Take 1 tablet (5 mg total) by mouth daily. 09/12/15   Anne MacadamiaLori C Gerhardt,  NP  MAGNESIUM PO Take 1 tablet by mouth daily.    Historical Provider, MD  ondansetron (ZOFRAN) 4 MG tablet Take 1 tablet (4 mg total) by mouth every 6 (six) hours. 06/28/14   Tilden FossaElizabeth Rees, MD  ondansetron (ZOFRAN-ODT) 8 MG disintegrating tablet Take 1 tablet by mouth as needed for nausea.  01/03/16   Historical Provider, MD  OVER THE COUNTER MEDICATION Aleve PRN for mild pain.    Historical Provider, MD  phenylephrine (,USE FOR PREPARATION-H,) 0.25 % suppository Place 1 suppository rectally 2 (two) times daily as needed for hemorrhoids. 01/06/16   Anne SeaPrashant K Singh, MD  pregabalin (LYRICA) 50 MG capsule Take 1 capsule (50 mg total) by mouth at bedtime. 09/27/15   Anne Forcucci, PA-C  saccharomyces  boulardii (FLORASTOR) 250 MG capsule Take 1 capsule (250 mg total) by mouth 2 (two) times daily. 01/06/16   Anne SeaPrashant K Singh, MD  SYNTHROID 75 MCG tablet TAKE 1 TABLET DAILY BEFORE BREAKFAST 10/09/15   Quentin MullingAmanda Collier, PA-C  traMADol (ULTRAM) 50 MG tablet TAKE 1 TABLET BY MOUTH 3 TO 4 TIMES A DAY AS NEEDED FOR PAIN 10/08/15   Quentin MullingAmanda Collier, PA-C  triamcinolone (KENALOG) 0.025 % cream Apply 1 application topically as needed (for rash).     Historical Provider, MD    Family History Family History  Problem Relation Age of Onset  . Heart attack Father   . Cancer Brother   . Stroke Brother   . Heart attack Brother     Social History Social History  Substance Use Topics  . Smoking status: Never Smoker  . Smokeless tobacco: Never Used  . Alcohol use No     Allergies   Augmentin [amoxicillin-pot clavulanate]; Ciprocin-fluocin-procin [fluocinolone acetonide]; Ciprofloxacin hcl; Codeine; and Sulfa drugs cross reactors   Review of Systems Review of Systems  Musculoskeletal: Positive for myalgias.  Skin: Positive for color change and wound.  All other systems reviewed and are negative.    Physical Exam Updated Vital Signs BP 152/79 (BP Location: Right Arm)   Pulse 81   Temp 97.5 F (36.4 C) (Oral)   Resp 18   Ht 5\' 1"  (1.549 m)   Wt 117 lb (53.1 kg)   SpO2 98%   BMI 22.11 kg/m   Physical Exam  Constitutional: She is oriented to person, place, and time. She appears well-developed and well-nourished. No distress.  HENT:  Head: Normocephalic and atraumatic.  Eyes: Conjunctivae are normal. Right eye exhibits no discharge. Left eye exhibits no discharge. No scleral icterus.  Cardiovascular: Normal rate.   Pulmonary/Chest: Effort normal.  Musculoskeletal: She exhibits tenderness.  Left mid-shin tender to touch.  Neurological: She is alert and oriented to person, place, and time. Coordination normal.  Skin: Skin is warm and dry. No rash noted. She is not diaphoretic. No erythema.  No pallor.  3 cm hematoma localized to left mid-shin. 20 cm area of bruising.  Psychiatric: She has a normal mood and affect. Her behavior is normal.  Nursing note and vitals reviewed.  ED Treatments / Results  DIAGNOSTIC STUDIES:  Oxygen Saturation is 98% on room air, normal by my interpretation.    COORDINATION OF CARE:  1:14 PM Discussed treatment plan with pt at bedside, which includes dressing of affected area and pt agreed to plan. Advised pt to not drain the site and to follow up with PCP.  Labs (all labs ordered are listed, but only abnormal results are displayed) Labs Reviewed - No data to display  EKG  EKG Interpretation None       Radiology No results found.  Procedures Procedures (including critical care time)  Medications Ordered in ED Medications - No data to display   Initial Impression / Assessment and Plan / ED Course  I have reviewed the triage vital signs and the nursing notes.  Pertinent labs & imaging results that were available during my care of the patient were reviewed by me and considered in my medical decision making (see chart for details).  Clinical Course   Pt counseled on wound care   Final Clinical Impressions(s) / ED Diagnoses   Final diagnoses:  Contusion, lower leg, left, initial encounter  Traumatic hematoma of left lower leg, initial encounter    New Prescriptions New Prescriptions   No medications on file  An After Visit Summary was printed and given to the patient.    Lonia Skinner Evan, PA-C 01/17/16 1331    Loren Racer, MD 01/23/16 [phone]

## 2016-01-17 NOTE — ED Triage Notes (Signed)
Pt here with large hematoma to left lower leg. Pt sts she opened the freezer and something fell out onto her leg. Pt was ambulatory after event. CMS intact distally.

## 2016-01-17 NOTE — ED Provider Notes (Deleted)
[facility]EM[facility]Y DEPT Provider Note   CSN: [id number] Arrival date & time: 01/17/16  1128     History   Chief Complaint Chief Complaint  Patient presents with  . Leg Injury    HPI [name] is a 82 y.o. female.  HPI  Past Medical History:  Diagnosis Date  . Atrial fibrillation (HCC)    not a candidate for coumadin due to falls  . AV block, 1st degree   . CVD (cerebrovascular disease)    History of CVA  . GERD (gastroesophageal reflux disease)   . History of echocardiogram 9/11   normal EF, mild to mod MR & TR, mod pulmonary HTN  . Hypertension   . Hypothyroidism   . Osteoarthritis   . Pneumonia 09/11  . Prediabetes   . Syncope and collapse    Negative loop recorder in the past  . Traumatic enucleation of eye    left eye    Patient Active Problem List   Diagnosis Date Noted  . Orthostatic hypotension 01/04/2016  . Syncope and collapse 01/04/2016  . Syncope 01/04/2016  . Malnutrition of mild degree (HCC) 11/13/2015  . Vitamin D deficiency 08/30/2014  . Medication management 08/30/2014  . Hyperlipidemia 06/29/2013  . Prediabetes   . GERD (gastroesophageal reflux disease)   . Hypertension   . AV block, 1st degree   . CVD (cerebrovascular disease)   . Osteoarthritis   . Hypothyroidism   . Atrial fibrillation [facility](HCC)     Past Surgical History:  Procedure Laterality Date  . BACK SURGERY    . INGUINAL HERNIA REPAIR     left sided    OB History    No data available       Home Medications    Prior to Admission medications   Medication Sig Start Date End Date Taking? Authorizing Provider  acetaminophen (TYLENOL) 325 MG tablet Take 325 mg by mouth every 6 (six) hours as needed for headache.    Historical Provider, MD  aspirin 81 MG tablet Take 81 mg by mouth at bedtime.     Historical Provider, MD  beta carotene w/minerals (OCUVITE) tablet Take 1 tablet by mouth 2 (two) times daily.    Historical Provider, MD  Cholecalciferol (VITAMIN D3) 1000  UNITS CAPS Take 1,000 Units by mouth 2 (two) times daily.     Historical Provider, MD  DULoxetine (CYMBALTA) 60 MG capsule Take 1 capsule (60 mg total) by mouth daily. 12/21/15   Lucky CowboyWilliam McKeown, MD  Flaxseed, Linseed, (FLAX SEED OIL PO) Take 1 tablet by mouth 2 (two) times daily.     Historical Provider, MD  lidocaine (LIDODERM) 5 % Apply patch daily as needed for pain 01/11/16 07/13/16  Lucky CowboyWilliam McKeown, MD  lisinopril (PRINIVIL,ZESTRIL) 5 MG tablet Take 1 tablet (5 mg total) by mouth daily. 09/12/15   Rosalio MacadamiaLori C Gerhardt, NP  MAGNESIUM PO Take 1 tablet by mouth daily.    Historical Provider, MD  ondansetron (ZOFRAN) 4 MG tablet Take 1 tablet (4 mg total) by mouth every 6 (six) hours. 06/28/14   Tilden FossaElizabeth Rees, MD  ondansetron (ZOFRAN-ODT) 8 MG disintegrating tablet Take 1 tablet by mouth as needed for nausea.  01/03/16   Historical Provider, MD  OVER THE COUNTER MEDICATION Aleve PRN for mild pain.    Historical Provider, MD  phenylephrine (,USE FOR PREPARATION-H,) 0.25 % suppository Place 1 suppository rectally 2 (two) times daily as needed for hemorrhoids. 01/06/16   Leroy SeaPrashant K Singh, MD  pregabalin (LYRICA) 50 MG capsule  Take 1 capsule (50 mg total) by mouth at bedtime. 09/27/15   Courtney Forcucci, PA-C  saccharomyces boulardii (FLORASTOR) 250 MG capsule Take 1 capsule (250 mg total) by mouth 2 (two) times daily. 01/06/16   Leroy SeaPrashant K Singh, MD  SYNTHROID 75 MCG tablet TAKE 1 TABLET DAILY BEFORE BREAKFAST 10/09/15   Quentin MullingAmanda Collier, PA-C  traMADol (ULTRAM) 50 MG tablet TAKE 1 TABLET BY MOUTH 3 TO 4 TIMES A DAY AS NEEDED FOR PAIN 10/08/15   Quentin MullingAmanda Collier, PA-C  triamcinolone (KENALOG) 0.025 % cream Apply 1 application topically as needed (for rash).     Historical Provider, MD    Family History Family History  Problem Relation Age of Onset  . Heart attack Father   . Cancer Brother   . Stroke Brother   . Heart attack Brother     Social History Social History  Substance Use Topics  . Smoking status:  Never Smoker  . Smokeless tobacco: Never Used  . Alcohol use No     Allergies   Augmentin [amoxicillin-pot clavulanate]; Ciprocin-fluocin-procin [fluocinolone acetonide]; Ciprofloxacin hcl; Codeine; and Sulfa drugs cross reactors   Review of Systems Review of Systems   Physical Exam Updated Vital Signs BP 152/79 (BP Location: Right Arm)   Pulse 81   Temp 97.5 F (36.4 C) (Oral)   Resp 18   Ht 5\' 1"  (1.549 m)   Wt 53.1 kg   SpO2 98%   BMI 22.11 kg/m   Physical Exam   ED Treatments / Results  Labs (all labs ordered are listed, but only abnormal results are displayed) Labs Reviewed - No data to display  EKG  EKG Interpretation None       Radiology Dg Tibia/fibula Left  Result Date: 01/17/2016 CLINICAL DATA:  Hematoma after trauma EXAM: LEFT TIBIA AND FIBULA - 2 VIEW COMPARISON:  None FINDINGS: The patient's known hematoma is identified.  No underlying fracture. IMPRESSION: Hematoma without fracture. Electronically Signed   By: Gerome Samavid  Williams III M.D   On: 01/17/2016 13:12    Procedures Procedures (including critical care time)  Medications Ordered in ED Medications - No data to display   Initial Impression / Assessment and Plan / ED Course  I have reviewed the triage vital signs and the nursing notes.  Pertinent labs & imaging results that were available during my care of the patient were reviewed by me and considered in my medical decision making (see chart for details).  Clinical Course   No fracture.  Pt and family counseled on injury.   Pt advised to see her Md on Monday  For recheck   Final Clinical Impressions(s) / ED Diagnoses   Final diagnoses:  Contusion, lower leg, left, initial encounter  Traumatic hematoma of left lower leg, initial encounter    New Prescriptions New Prescriptions   No medications on file  An After Visit Summary was printed and given to the patient. I personally performed the services in this documentation, which  was scribed in my presence.  The recorded information has been reviewed and considered.   Barnet PallKaren SofiaPAC.   Lonia SkinnerLeslie K NeffsSofia, PA-C 01/17/16 1329

## 2016-01-17 NOTE — ED Notes (Signed)
Declined W/C at D/C and was escorted to lobby by RN. 

## 2016-01-21 ENCOUNTER — Encounter: Payer: Self-pay | Admitting: Internal Medicine

## 2016-01-21 ENCOUNTER — Ambulatory Visit (INDEPENDENT_AMBULATORY_CARE_PROVIDER_SITE_OTHER): Payer: Medicare Other | Admitting: Internal Medicine

## 2016-01-21 VITALS — BP 158/64 | HR 62 | Temp 98.2°F | Resp 16 | Ht 62.0 in | Wt 108.0 lb

## 2016-01-21 DIAGNOSIS — S8012XA Contusion of left lower leg, initial encounter: Secondary | ICD-10-CM

## 2016-01-21 MED ORDER — DOXYCYCLINE HYCLATE 100 MG PO CAPS: 100.0000 mg | ORAL_CAPSULE | Freq: Two times a day (BID) | ORAL | 0 refills | Status: DC

## 2016-01-22 NOTE — Progress Notes (Signed)
   Subjective:    Patient ID: [name], female    DOB: 08/27/1914, 82 y.o.   MRN: [id number]  HPI  Patient presents to the office with her daughter for post ER evaluation of a left leg hematoma.  They report that it has been mildly painful.  They have been keeping it bandaged.  No drainage.  No redness fevers, chills nausea, or vomiting.  She is taking tylenol and tramadol.  Review of Systems  Constitutional: Negative for chills, fatigue and fever.  Gastrointestinal: Negative for nausea and vomiting.  Skin: Positive for color change and wound. Negative for rash.       Objective:   Physical Exam  Constitutional: She appears well-developed and well-nourished. No distress.  HENT:  Head: Normocephalic.  Mouth/Throat: Oropharynx is clear and moist. No oropharyngeal exudate.  Eyes: Conjunctivae are normal. No scleral icterus.  Neck: Normal range of motion. Neck supple. No JVD present. No thyromegaly present.  Cardiovascular: Normal rate, regular rhythm and intact distal pulses.   Murmur heard. Pulmonary/Chest: Effort normal and breath sounds normal.  Lymphadenopathy:    She has no cervical adenopathy.  Skin: Skin is warm and dry. She is not diaphoretic.     Psychiatric: She has a normal mood and affect. Her behavior is normal. Judgment and thought content normal.  Nursing note and vitals reviewed.   Vitals:   01/21/16 1508  BP: (!) 158/64  Pulse: 62  Resp: 16  Temp: 98.2 F (36.8 C)    Procedure:  After obtaining verbal consent from both the patient and her daughter for an incision and drainage of the left lower leg hematoma the patient was prepped in a semi sterile fashion with iodine.  2cc of 1% plain lidocaine with adequate numbing of the skin.  A 1.5 cm superficial linear incision was made on the lateral edge of the hematoma with some drainage of sanguinous material.  The majority of the wound was clotted blood.  Blunt dissection with hemostats was performed.  Wound was  clean with hydrogen peroxide.  Non-adherent abdominal pad was placed over the wound and wrapped with guaze and a loose fitting ACE bandage.  Patient tolerated the procedure well.      Assessment & Plan:    1. Hematoma of leg, left, initial encounter -doxycycline -Incision and drainage was performed her -warm compresses or low heating pad -patient's daughter to call the office if purulent drainage, warmth, fever, or redness -tramadol and tylenol as needed for pain.  - AMB referral to wound care center

## 2016-01-25 ENCOUNTER — Observation Stay (HOSPITAL_COMMUNITY)
Admission: EM | Admit: 2016-01-25 | Discharge: 2016-01-29 | Disposition: A | Discharge status: Home / Self Care | Payer: Medicare Other | Attending: Internal Medicine | Admitting: Internal Medicine

## 2016-01-25 ENCOUNTER — Encounter (HOSPITAL_COMMUNITY): Payer: Self-pay | Admitting: Vascular Surgery

## 2016-01-25 DIAGNOSIS — S8992XA Unspecified injury of left lower leg, initial encounter: Secondary | ICD-10-CM | POA: Diagnosis present

## 2016-01-25 DIAGNOSIS — L03116 Cellulitis of left lower limb: Principal | ICD-10-CM | POA: Insufficient documentation

## 2016-01-25 DIAGNOSIS — S8012XA Contusion of left lower leg, initial encounter: Secondary | ICD-10-CM | POA: Diagnosis not present

## 2016-01-25 DIAGNOSIS — Z7982 Long term (current) use of aspirin: Secondary | ICD-10-CM | POA: Diagnosis not present

## 2016-01-25 DIAGNOSIS — I4891 Unspecified atrial fibrillation: Secondary | ICD-10-CM | POA: Diagnosis not present

## 2016-01-25 DIAGNOSIS — K219 Gastro-esophageal reflux disease without esophagitis: Secondary | ICD-10-CM | POA: Insufficient documentation

## 2016-01-25 DIAGNOSIS — T148 Other injury of unspecified body region: Secondary | ICD-10-CM | POA: Diagnosis not present

## 2016-01-25 DIAGNOSIS — E871 Hypo-osmolality and hyponatremia: Secondary | ICD-10-CM | POA: Diagnosis not present

## 2016-01-25 DIAGNOSIS — M79606 Pain in leg, unspecified: Secondary | ICD-10-CM

## 2016-01-25 DIAGNOSIS — Z79899 Other long term (current) drug therapy: Secondary | ICD-10-CM | POA: Insufficient documentation

## 2016-01-25 DIAGNOSIS — Z8673 Personal history of transient ischemic attack (TIA), and cerebral infarction without residual deficits: Secondary | ICD-10-CM | POA: Diagnosis not present

## 2016-01-25 DIAGNOSIS — N179 Acute kidney failure, unspecified: Secondary | ICD-10-CM | POA: Insufficient documentation

## 2016-01-25 DIAGNOSIS — E039 Hypothyroidism, unspecified: Secondary | ICD-10-CM | POA: Insufficient documentation

## 2016-01-25 DIAGNOSIS — I10 Essential (primary) hypertension: Secondary | ICD-10-CM | POA: Insufficient documentation

## 2016-01-25 DIAGNOSIS — W208XXA Other cause of strike by thrown, projected or falling object, initial encounter: Secondary | ICD-10-CM | POA: Diagnosis not present

## 2016-01-25 DIAGNOSIS — T148XXA Other injury of unspecified body region, initial encounter: Secondary | ICD-10-CM

## 2016-01-25 LAB — CBC WITH DIFFERENTIAL/PLATELET
BASOS ABS: 0.2 10*3/uL — AB (ref 0.0–0.1)
BASOS PCT: 2 %
Eosinophils Absolute: 0.7 10*3/uL (ref 0.0–0.7)
Eosinophils Relative: 7 %
HEMATOCRIT: 37.2 % (ref 36.0–46.0)
Hemoglobin: 12.1 g/dL (ref 12.0–15.0)
LYMPHS ABS: 2.4 10*3/uL (ref 0.7–4.0)
Lymphocytes Relative: 23 %
MCH: 30.9 pg (ref 26.0–34.0)
MCHC: 32.5 g/dL (ref 30.0–36.0)
MCV: 95.1 fL (ref 78.0–100.0)
Monocytes Absolute: 1.6 10*3/uL — ABNORMAL HIGH (ref 0.1–1.0)
Monocytes Relative: 15 %
NEUTROS PCT: 53 %
Neutro Abs: 5.8 10*3/uL (ref 1.7–7.7)
PLATELETS: 385 10*3/uL (ref 150–400)
RBC: 3.91 MIL/uL (ref 3.87–5.11)
RDW: 13.2 % (ref 11.5–15.5)
WBC: 10.7 10*3/uL — ABNORMAL HIGH (ref 4.0–10.5)

## 2016-01-25 LAB — COMPREHENSIVE METABOLIC PANEL
ALBUMIN: 3.6 g/dL (ref 3.5–5.0)
ALT: 14 U/L (ref 14–54)
AST: 27 U/L (ref 15–41)
Alkaline Phosphatase: 65 U/L (ref 38–126)
Anion gap: 8 (ref 5–15)
BILIRUBIN TOTAL: 0.9 mg/dL (ref 0.3–1.2)
BUN: 19 mg/dL (ref 6–20)
CHLORIDE: 94 mmol/L — AB (ref 101–111)
CO2: 26 mmol/L (ref 22–32)
CREATININE: 1.17 mg/dL — AB (ref 0.44–1.00)
Calcium: 9.4 mg/dL (ref 8.9–10.3)
GFR calc Af Amer: 43 mL/min — ABNORMAL LOW (ref >60)
GFR, EST NON AFRICAN AMERICAN: 38 mL/min — AB (ref >60)
GLUCOSE: 128 mg/dL — AB (ref 65–99)
Potassium: 4.6 mmol/L (ref 3.5–5.1)
Sodium: 128 mmol/L — ABNORMAL LOW (ref 135–145)
Total Protein: 6.5 g/dL (ref 6.5–8.1)

## 2016-01-25 MED ORDER — CLINDAMYCIN PHOSPHATE 600 MG/50ML IV SOLN
600.0000 mg | Freq: Once | INTRAVENOUS | Status: AC
  Administered 2016-01-25: 600 mg via INTRAVENOUS
  Filled 2016-01-25: qty 50

## 2016-01-25 MED ORDER — DOCUSATE SODIUM 100 MG PO CAPS
100.0000 mg | ORAL_CAPSULE | Freq: Two times a day (BID) | ORAL | Status: DC | PRN
  Filled 2016-01-25: qty 1

## 2016-01-25 MED ORDER — DULOXETINE HCL 60 MG PO CPEP
60.0000 mg | ORAL_CAPSULE | Freq: Every day | ORAL | Status: DC
  Administered 2016-01-26 – 2016-01-29 (×4): 60 mg via ORAL
  Filled 2016-01-25 (×4): qty 1

## 2016-01-25 MED ORDER — ONDANSETRON HCL 4 MG/2ML IJ SOLN: 4.0000 mg | Freq: Four times a day (QID) | INTRAMUSCULAR | Status: DC | PRN

## 2016-01-25 MED ORDER — ENOXAPARIN SODIUM 30 MG/0.3ML ~~LOC~~ SOLN
30.0000 mg | Freq: Every day | SUBCUTANEOUS | Status: DC
  Administered 2016-01-26 – 2016-01-29 (×4): 30 mg via SUBCUTANEOUS
  Filled 2016-01-25 (×4): qty 0.3

## 2016-01-25 MED ORDER — CLINDAMYCIN PHOSPHATE 600 MG/50ML IV SOLN
600.0000 mg | Freq: Three times a day (TID) | INTRAVENOUS | Status: DC
  Administered 2016-01-26 – 2016-01-27 (×4): 600 mg via INTRAVENOUS
  Filled 2016-01-25 (×7): qty 50

## 2016-01-25 MED ORDER — PROSIGHT PO TABS
1.0000 | ORAL_TABLET | Freq: Every day | ORAL | Status: DC
  Administered 2016-01-26 – 2016-01-29 (×4): 1 via ORAL
  Filled 2016-01-25 (×4): qty 1

## 2016-01-25 MED ORDER — SODIUM CHLORIDE 0.9 % IV SOLN
INTRAVENOUS | Status: DC
  Administered 2016-01-25: 125 mL/h via INTRAVENOUS

## 2016-01-25 MED ORDER — ASPIRIN EC 81 MG PO TBEC
81.0000 mg | DELAYED_RELEASE_TABLET | Freq: Every day | ORAL | Status: DC
  Administered 2016-01-25 – 2016-01-28 (×4): 81 mg via ORAL
  Filled 2016-01-25 (×4): qty 1

## 2016-01-25 MED ORDER — ACETAMINOPHEN 325 MG PO TABS
650.0000 mg | ORAL_TABLET | Freq: Four times a day (QID) | ORAL | Status: DC | PRN
  Administered 2016-01-27: 650 mg via ORAL
  Filled 2016-01-25: qty 2

## 2016-01-25 MED ORDER — ONDANSETRON HCL 4 MG PO TABS: 4.0000 mg | ORAL_TABLET | Freq: Four times a day (QID) | ORAL | Status: DC | PRN

## 2016-01-25 MED ORDER — PREGABALIN 50 MG PO CAPS
50.0000 mg | ORAL_CAPSULE | Freq: Every day | ORAL | Status: DC
  Administered 2016-01-25 – 2016-01-28 (×4): 50 mg via ORAL
  Filled 2016-01-25 (×4): qty 1

## 2016-01-25 MED ORDER — LEVOTHYROXINE SODIUM 75 MCG PO TABS
75.0000 ug | ORAL_TABLET | Freq: Every day | ORAL | Status: DC
  Administered 2016-01-26 – 2016-01-29 (×4): 75 ug via ORAL
  Filled 2016-01-25 (×5): qty 1

## 2016-01-25 MED ORDER — MORPHINE SULFATE (PF) 2 MG/ML IV SOLN: 1.0000 mg | INTRAVENOUS | Status: DC | PRN

## 2016-01-25 MED ORDER — LISINOPRIL 5 MG PO TABS
5.0000 mg | ORAL_TABLET | Freq: Every day | ORAL | Status: DC
  Administered 2016-01-26 – 2016-01-28 (×3): 5 mg via ORAL
  Filled 2016-01-25 (×3): qty 1

## 2016-01-25 MED ORDER — LACTATED RINGERS IV SOLN
INTRAVENOUS | Status: AC
  Administered 2016-01-25: 23:00:00 via INTRAVENOUS

## 2016-01-25 MED ORDER — ACETAMINOPHEN 650 MG RE SUPP
650.0000 mg | Freq: Four times a day (QID) | RECTAL | Status: DC | PRN
Start: 2016-01-25 — End: 2016-01-29

## 2016-01-25 NOTE — ED Provider Notes (Signed)
[facility]-EMERGENCY DEPT Provider Note   CSN: [id number] Arrival date & time: 01/25/16  1736     History   Chief Complaint Chief Complaint  Patient presents with  . Leg Injury    HPI [name] is a 82 y.o. female.  The history is provided by the patient.  82 year old female who presents for left leg pain. Patient was seen here 7 days ago for trauma to the leg after a frozen piece of meat hit admission. Patient seen by her PCP for follow up and was started on doxycycline for developing cellulitis. Erythema has worsened since the 28th. Patient denies any other physical complaints.  Past Medical History:  Diagnosis Date  . Atrial fibrillation (HCC)    not a candidate for coumadin due to falls  . AV block, 1st degree   . CVD (cerebrovascular disease)    History of CVA  . GERD (gastroesophageal reflux disease)   . History of echocardiogram 9/11   normal EF, mild to mod MR & TR, mod pulmonary HTN  . Hypertension   . Hypothyroidism   . Osteoarthritis   . Pneumonia 09/11  . Prediabetes   . Syncope and collapse    Negative loop recorder in the past  . Traumatic enucleation of eye    left eye    Patient Active Problem List   Diagnosis Date Noted  . Cellulitis 01/25/2016  . Orthostatic hypotension 01/04/2016  . Syncope and collapse 01/04/2016  . Syncope 01/04/2016  . Malnutrition of mild degree (HCC) 11/13/2015  . Vitamin D deficiency 08/30/2014  . Medication management 08/30/2014  . Hyperlipidemia 06/29/2013  . Prediabetes   . GERD (gastroesophageal reflux disease)   . Hypertension   . AV block, 1st degree   . CVD (cerebrovascular disease)   . Osteoarthritis   . Hypothyroidism   . Atrial fibrillation [facility])     Past Surgical History:  Procedure Laterality Date  . BACK SURGERY    . INGUINAL HERNIA REPAIR     left sided    OB History    No data available       Home Medications    Prior to Admission medications   Medication Sig Start Date End Date  Taking? Authorizing Provider  acetaminophen (TYLENOL) 325 MG tablet Take 650 mg by mouth every 6 (six) hours as needed for mild pain (when Tramadol is taken).    Yes Historical Provider, MD  aspirin 81 MG tablet Take 81 mg by mouth at bedtime.    Yes Historical Provider, MD  beta carotene w/minerals (OCUVITE) tablet Take 1 tablet by mouth 2 (two) times daily.   Yes Historical Provider, MD  Cholecalciferol (VITAMIN D3) 1000 UNITS CAPS Take 1,000 Units by mouth 2 (two) times daily.    Yes Historical Provider, MD  docusate sodium (COLACE) 100 MG capsule Take 100 mg by mouth 2 (two) times daily as needed for mild constipation.  01/06/16  Yes Historical Provider, MD  DULoxetine (CYMBALTA) 60 MG capsule Take 1 capsule (60 mg total) by mouth daily. 12/21/15  Yes Lucky Cowboy, MD  lisinopril (PRINIVIL,ZESTRIL) 5 MG tablet Take 1 tablet (5 mg total) by mouth daily. 09/12/15  Yes Rosalio Macadamia, NP  ondansetron (ZOFRAN) 4 MG tablet Take 1 tablet (4 mg total) by mouth every 6 (six) hours. Patient taking differently: Take 4 mg by mouth every 6 (six) hours as needed for nausea.  06/28/14  Yes Tilden Fossa, MD  pregabalin (LYRICA) 50 MG capsule Take 1 capsule (50  mg total) by mouth at bedtime. 09/27/15  Yes Courtney Forcucci, PA-C  SYNTHROID 75 MCG tablet TAKE 1 TABLET DAILY BEFORE BREAKFAST Patient taking differently: TAKE 1 TABLET (75 mcg) DAILY BEFORE BREAKFAST 10/09/15  Yes Quentin MullingAmanda Collier, PA-C  traMADol (ULTRAM) 50 MG tablet TAKE 1 TABLET BY MOUTH 3 TO 4 TIMES A DAY AS NEEDED FOR PAIN Patient taking differently: TAKE 1 TABLET (50 mg) BY MOUTH 3 TO 4 TIMES A DAY AS NEEDED FOR PAIN (takes with Tylenol 650 mg) 10/08/15  Yes Quentin MullingAmanda Collier, PA-C    Family History Family History  Problem Relation Age of Onset  . Heart attack Father   . Cancer Brother   . Stroke Brother   . Heart attack Brother     Social History Social History  Substance Use Topics  . Smoking status: Never Smoker  . Smokeless tobacco:  Never Used  . Alcohol use No     Allergies   Augmentin [amoxicillin-pot clavulanate]; Ciprocin-fluocin-procin [fluocinolone acetonide]; Ciprofloxacin hcl; Codeine; and Sulfa drugs cross reactors   Review of Systems Review of Systems  Constitutional: Negative for chills, fatigue and fever.  HENT: Negative for congestion and sore throat.   Eyes: Negative for visual disturbance.  Respiratory: Negative for cough, chest tightness and shortness of breath.   Cardiovascular: Negative for chest pain and palpitations.  Gastrointestinal: Negative for abdominal pain, blood in stool, diarrhea, nausea and vomiting.  Genitourinary: Negative for decreased urine volume and difficulty urinating.  Musculoskeletal: Negative for back pain and neck stiffness.  Skin: Positive for wound. Negative for rash.  Neurological: Negative for light-headedness and headaches.  Psychiatric/Behavioral: Negative for confusion.  All other systems reviewed and are negative.    Physical Exam Updated Vital Signs BP (!) 162/78 (BP Location: Right Arm)   Pulse 71   Temp 97.5 F (36.4 C) (Oral)   Resp 16   SpO2 98%   Physical Exam  Constitutional: She is oriented to person, place, and time. She appears well-developed and well-nourished. No distress.  HENT:  Head: Normocephalic and atraumatic.  Nose: Nose normal.  Eyes: Conjunctivae and EOM are normal. Pupils are equal, round, and reactive to light. Right eye exhibits no discharge. Left eye exhibits no discharge. No scleral icterus.  Neck: Normal range of motion. Neck supple.  Cardiovascular: Normal rate and regular rhythm.  Exam reveals no gallop and no friction rub.   No murmur heard. Pulmonary/Chest: Effort normal and breath sounds normal. No stridor. No respiratory distress. She has no rales.  Abdominal: Soft. She exhibits no distension. There is no tenderness.  Musculoskeletal: She exhibits no edema or tenderness.       Legs: Neurological: She is alert and  oriented to person, place, and time.  Skin: Skin is warm and dry. No rash noted. She is not diaphoretic. No erythema.  Psychiatric: She has a normal mood and affect.  Vitals reviewed.    ED Treatments / Results  Labs (all labs ordered are listed, but only abnormal results are displayed) Labs Reviewed  CBC WITH DIFFERENTIAL/PLATELET - Abnormal; Notable for the following:       Result Value   WBC 10.7 (*)    Monocytes Absolute 1.6 (*)    Basophils Absolute 0.2 (*)    All other components within normal limits  COMPREHENSIVE METABOLIC PANEL - Abnormal; Notable for the following:    Sodium 128 (*)    Chloride 94 (*)    Glucose, Bld 128 (*)    Creatinine, Ser 1.17 (*)  GFR calc non Af Amer 38 (*)    GFR calc Af Amer 43 (*)    All other components within normal limits  CULTURE, BLOOD (ROUTINE X 2)  BASIC METABOLIC PANEL  CBC    EKG  EKG Interpretation None       Radiology No results found.  Procedures Procedures (including critical care time)  Medications Ordered in ED Medications  docusate sodium (COLACE) capsule 100 mg (not administered)  DULoxetine (CYMBALTA) DR capsule 60 mg (not administered)  levothyroxine (SYNTHROID, LEVOTHROID) tablet 75 mcg (not administered)  pregabalin (LYRICA) capsule 50 mg (50 mg Oral Given 01/25/16 2318)  lisinopril (PRINIVIL,ZESTRIL) tablet 5 mg (not administered)  multivitamin (PROSIGHT) tablet 1 tablet (not administered)  aspirin EC tablet 81 mg (81 mg Oral Given 01/25/16 2318)  enoxaparin (LOVENOX) injection 30 mg (not administered)  acetaminophen (TYLENOL) tablet 650 mg (not administered)    Or  acetaminophen (TYLENOL) suppository 650 mg (not administered)  ondansetron (ZOFRAN) tablet 4 mg (not administered)    Or  ondansetron (ZOFRAN) injection 4 mg (not administered)  clindamycin (CLEOCIN) IVPB 600 mg (not administered)  lactated ringers infusion ( Intravenous New Bag/Given 01/25/16 2318)  morphine 2 MG/ML injection 1 mg (not  administered)  clindamycin (CLEOCIN) IVPB 600 mg (600 mg Intravenous New Bag/Given 01/25/16 2020)     Initial Impression / Assessment and Plan / ED Course  I have reviewed the triage vital signs and the nursing notes.  Pertinent labs & imaging results that were available during my care of the patient were reviewed by me and considered in my medical decision making (see chart for details).  Clinical Course    Ruptured cutaneous hematoma with worsening cellulitis. Failed OP management. Requires IV Abx and admission.  Final Clinical Impressions(s) / ED Diagnoses   Final diagnoses:  Hematoma  Cellulitis of left lower extremity    Disposition: Admit  Condition: stable    Nira Conn, MD 01/26/16 [phone]

## 2016-01-25 NOTE — H&P (Signed)
History and Physical    [name] ZOX:[id number] DOB: 1915/03/16 DOA: 01/25/2016  PCP: Nadean Corwin, MD Consultants:  Cardiology - Gerhardt Patient coming from: home - liver with rotating family members (son was her caregiver and he died last year)  Chief Complaint: worsening hematoma/cellulitis  HPI: [name] is a 82 y.o. female with medical history significant of afib, h/o CVA, HTN,  hypothyroidism presenting with worsening leg condition.  Thursday of last week, she was looking in the freezer and a pack of meat fell down and hit her on the leg (she still cooks all of her meals).  Grandson brought her to the ER and she was sent home following negative xrays.  Sent to PCP Monday and had drainage of hematoma was started on antibiotics (Doxycycline) - extremely painful but no obvious infection, just trying to head off infection at that time per family report.  Unable to get into wound care center - has appt at [facility] next Tuesday.  It has been getting worse- surrounding erythema is growing.  No fevers.  Never been a big eater but eating as well as usual.  Had a fall about 6 weeks ago.  Had pain and was increased from Tramadol to hydrocodone and it created constipation and was hospitalized 8/11-13.  Was given Augmentin for lung opacities, had severe diarrhea.  ED Course: Per Dr. Eudelia Bunch:  Ruptured cutaneous hematoma with worsening cellulitis. Failed OP management. Requires IV Abx and admission  Review of Systems: As per HPI; otherwise 10 point review of systems reviewed and negative.   Ambulatory Status:  Ambulates with walker more since fall 6 weeks ago  Past Medical History:  Diagnosis Date  . Atrial fibrillation (HCC)    not a candidate for coumadin due to falls  . AV block, 1st degree   . CVD (cerebrovascular disease)    History of CVA  . GERD (gastroesophageal reflux disease)   . History of echocardiogram 9/11   normal EF, mild to mod MR & TR, mod pulmonary HTN    . Hypertension   . Hypothyroidism   . Osteoarthritis   . Pneumonia 09/11  . Prediabetes   . Syncope and collapse    Negative loop recorder in the past  . Traumatic enucleation of eye    left eye    Past Surgical History:  Procedure Laterality Date  . BACK SURGERY    . INGUINAL HERNIA REPAIR     left sided    Social History   Social History  . Marital status: Single    Spouse name: N/A  . Number of children: N/A  . Years of education: N/A   Occupational History  . Not on file.   Social History Main Topics  . Smoking status: Never Smoker  . Smokeless tobacco: Never Used  . Alcohol use No  . Drug use: No  . Sexual activity: No   Other Topics Concern  . Not on file   Social History Narrative  . No narrative on file    Allergies  Allergen Reactions  . Augmentin [Amoxicillin-Pot Clavulanate] Diarrhea  . Ciprocin-Fluocin-Procin [Fluocinolone Acetonide] Nausea And Vomiting  . Ciprofloxacin Hcl Nausea And Vomiting  . Codeine Hives and Nausea And Vomiting  . Sulfa Drugs Cross Reactors Hives and Nausea And Vomiting    Family History  Problem Relation Age of Onset  . Heart attack Father   . Cancer Brother   . Stroke Brother   . Heart attack Brother  Prior to Admission medications   Medication Sig Start Date End Date Taking? Authorizing Provider  acetaminophen (TYLENOL) 325 MG tablet Take 650 mg by mouth every 6 (six) hours as needed for mild pain (when Tramadol is taken).    Yes Historical Provider, MD  aspirin 81 MG tablet Take 81 mg by mouth at bedtime.    Yes Historical Provider, MD  beta carotene w/minerals (OCUVITE) tablet Take 1 tablet by mouth 2 (two) times daily.   Yes Historical Provider, MD  Cholecalciferol (VITAMIN D3) 1000 UNITS CAPS Take 1,000 Units by mouth 2 (two) times daily.    Yes Historical Provider, MD  docusate sodium (COLACE) 100 MG capsule Take 100 mg by mouth 2 (two) times daily as needed for mild constipation.  01/06/16  Yes  Historical Provider, MD  doxycycline (VIBRAMYCIN) 100 MG capsule Take 1 capsule (100 mg total) by mouth 2 (two) times daily. One po bid x 7 days 01/21/16  Yes Courtney Forcucci, PA-C  DULoxetine (CYMBALTA) 60 MG capsule Take 1 capsule (60 mg total) by mouth daily. 12/21/15  Yes Lucky CowboyWilliam McKeown, MD  lisinopril (PRINIVIL,ZESTRIL) 5 MG tablet Take 1 tablet (5 mg total) by mouth daily. 09/12/15  Yes Rosalio MacadamiaLori C Gerhardt, NP  ondansetron (ZOFRAN) 4 MG tablet Take 1 tablet (4 mg total) by mouth every 6 (six) hours. Patient taking differently: Take 4 mg by mouth every 6 (six) hours as needed for nausea.  06/28/14  Yes Tilden FossaElizabeth Rees, MD  pregabalin (LYRICA) 50 MG capsule Take 1 capsule (50 mg total) by mouth at bedtime. 09/27/15  Yes Courtney Forcucci, PA-C  SYNTHROID 75 MCG tablet TAKE 1 TABLET DAILY BEFORE BREAKFAST Patient taking differently: TAKE 1 TABLET (75 mcg) DAILY BEFORE BREAKFAST 10/09/15  Yes Quentin MullingAmanda Collier, PA-C  traMADol (ULTRAM) 50 MG tablet TAKE 1 TABLET BY MOUTH 3 TO 4 TIMES A DAY AS NEEDED FOR PAIN Patient taking differently: TAKE 1 TABLET (50 mg) BY MOUTH 3 TO 4 TIMES A DAY AS NEEDED FOR PAIN (takes with Tylenol 650 mg) 10/08/15  Yes Quentin MullingAmanda Collier, PA-C  lidocaine (LIDODERM) 5 % Apply patch daily as needed for pain Patient not taking: Reported on 01/25/2016 01/11/16 07/13/16  Lucky CowboyWilliam McKeown, MD    Physical Exam: Vitals:   01/25/16 2000 01/25/16 2015 01/25/16 2030 01/25/16 2159  BP: 145/60 131/65 140/60 (!) 162/78  Pulse: 80 85 67 71  Resp:      Temp:    97.5 F (36.4 C)  TempSrc:    Oral  SpO2: 100% 99% 100% 98%     General:  Appears calm and comfortable and is NAD, somewhat somnolent Eyes:  PERRL, EOMI, normal lids, iris ENT: extremely hard of hearing, lips & tongue, mmm Neck:  no LAD, masses or thyromegaly Cardiovascular:  RRR, no m/r/g. No LE edema.  Respiratory:  CTA bilaterally, no w/r/r. Normal respiratory effort. Abdomen:  soft, ntnd, NABS Skin:  10 cm hematoma on left anterior  shin with surrounding erythema Musculoskeletal:  grossly normal tone BUE/BLE, good ROM, no bony abnormality Psychiatric:  grossly normal mood and affect, speech fluent and appropriate, AOx3 Neurologic:  CN 2-12 grossly intact, moves all extremities in coordinated fashion, sensation intact  Labs on Admission: I have personally reviewed following labs and imaging studies  CBC:  Recent Labs Lab 01/25/16 1802  WBC 10.7*  NEUTROABS 5.8  HGB 12.1  HCT 37.2  MCV 95.1  PLT 385   Basic Metabolic Panel:  Recent Labs Lab 01/25/16 1802  NA 128*  K  4.6  CL 94*  CO2 26  GLUCOSE 128*  BUN 19  CREATININE 1.17*  CALCIUM 9.4   GFR: Estimated Creatinine Clearance: 20.8 mL/min (by C-G formula based on SCr of 1.17 mg/dL). Liver Function Tests:  Recent Labs Lab 01/25/16 1802  AST 27  ALT 14  ALKPHOS 65  BILITOT 0.9  PROT 6.5  ALBUMIN 3.6   No results for input(s): LIPASE, AMYLASE in the last 168 hours. No results for input(s): AMMONIA in the last 168 hours. Coagulation Profile: No results for input(s): INR, PROTIME in the last 168 hours. Cardiac Enzymes: No results for input(s): CKTOTAL, CKMB, CKMBINDEX, TROPONINI in the last 168 hours. BNP (last 3 results) No results for input(s): PROBNP in the last 8760 hours. HbA1C: No results for input(s): HGBA1C in the last 72 hours. CBG: No results for input(s): GLUCAP in the last 168 hours. Lipid Profile: No results for input(s): CHOL, HDL, LDLCALC, TRIG, CHOLHDL, LDLDIRECT in the last 72 hours. Thyroid Function Tests: No results for input(s): TSH, T4TOTAL, FREET4, T3FREE, THYROIDAB in the last 72 hours. Anemia Panel: No results for input(s): VITAMINB12, FOLATE, FERRITIN, TIBC, IRON, RETICCTPCT in the last 72 hours. Urine analysis:    Component Value Date/Time   COLORURINE YELLOW 01/04/2016 0520   APPEARANCEUR CLEAR 01/04/2016 0520   LABSPEC 1.009 01/04/2016 0520   PHURINE 6.5 01/04/2016 0520   GLUCOSEU NEGATIVE 01/04/2016  0520   HGBUR NEGATIVE 01/04/2016 0520   BILIRUBINUR NEGATIVE 01/04/2016 0520   KETONESUR NEGATIVE 01/04/2016 0520   PROTEINUR NEGATIVE 01/04/2016 0520   UROBILINOGEN 0.2 03/23/2014 1621   NITRITE NEGATIVE 01/04/2016 0520   LEUKOCYTESUR NEGATIVE 01/04/2016 0520    Creatinine Clearance: Estimated Creatinine Clearance: 20.8 mL/min (by C-G formula based on SCr of 1.17 mg/dL).  Sepsis Labs: @LABRCNTIP (procalcitonin:4,lacticidven:4) )No results found for this or any previous visit (from the past 240 hour(s)).   Radiological Exams on Admission: No results found.  EKG: not done  Assessment/Plan Principal Problem:   Cellulitis Active Problems:   Hypothyroidism   Hematoma and contusion   Hyponatremia   AKI (acute kidney injury) (HCC)   Hematoma and surrounding cellulitis  -Will place in observation status for treatment of cellulitis with Clindamycin -Will request wound care evaluation as the hematoma appears to be in need of debridement -Blood culture pending -Patient with h/o sensitivity to pain medication so will start with low-dose (1 mg) morphine q4h prn pain and escalate if tolerated as needed  AKI/hyponatremia -Persistent hyponatremia since 01/04/16, worsening -Currently, appears to have prerenal azotemia which is contributing -Will gently rehydrate and follow -Only mildly elevated - usual baseline appears to be 1 or more and currently 1.17 -Will continue Lisinopril for now  Hypothyroidism -Continue Synthroid at home dose -Normal TSH on 01/05/16  DVT prophylaxis: Lovenox - but need to monitor hematoma to ensure it is not worsening (appears to be primarily old/coagulated blood so should be reasonable to use Lovenox for now - and SCDs would not be an option on the left) Code Status:  DNR - confirmed with patient/family Family Communication: daughter and granddaughter present throughout evaluation Disposition Plan: Home once clinically improved Consults called: None   Admission status: Observation to Med Surg    Jonah Blue MD [facility]  If 7PM-7AM, please contact night-coverage [email or web address] Password [email or web address]  01/26/2016, 2:12 AM

## 2016-01-25 NOTE — ED Triage Notes (Signed)
Pt reports to the ED for eval wound to left leg. Pt was seen here on 8/24 after she sustained an injury to her left lower leg from a frozen piece of meat fell on her leg and d/c her home. She f/ued with her PCP who placed her on oral abx (Doxicycline) which she has been compliant with. However, her symptoms have worsened and her leg has become more erythematous, warm, and enlarged. Her daughter texted the physician a picture of the wound and was told to bring her here.

## 2016-01-25 NOTE — ED Notes (Signed)
Admitting MD at the bedside.  

## 2016-01-26 DIAGNOSIS — L03116 Cellulitis of left lower limb: Secondary | ICD-10-CM | POA: Diagnosis not present

## 2016-01-26 DIAGNOSIS — E871 Hypo-osmolality and hyponatremia: Secondary | ICD-10-CM | POA: Diagnosis present

## 2016-01-26 DIAGNOSIS — I10 Essential (primary) hypertension: Secondary | ICD-10-CM

## 2016-01-26 DIAGNOSIS — T148 Other injury of unspecified body region: Secondary | ICD-10-CM | POA: Diagnosis not present

## 2016-01-26 DIAGNOSIS — N179 Acute kidney failure, unspecified: Secondary | ICD-10-CM | POA: Diagnosis not present

## 2016-01-26 DIAGNOSIS — T148XXA Other injury of unspecified body region, initial encounter: Secondary | ICD-10-CM

## 2016-01-26 LAB — CBC
HCT: 32.3 % — ABNORMAL LOW (ref 36.0–46.0)
HEMOGLOBIN: 10.4 g/dL — AB (ref 12.0–15.0)
MCH: 30.8 pg (ref 26.0–34.0)
MCHC: 32.2 g/dL (ref 30.0–36.0)
MCV: 95.6 fL (ref 78.0–100.0)
Platelets: 331 10*3/uL (ref 150–400)
RBC: 3.38 MIL/uL — AB (ref 3.87–5.11)
RDW: 13.3 % (ref 11.5–15.5)
WBC: 8.9 10*3/uL (ref 4.0–10.5)

## 2016-01-26 LAB — BASIC METABOLIC PANEL
ANION GAP: 8 (ref 5–15)
BUN: 18 mg/dL (ref 6–20)
CALCIUM: 8.8 mg/dL — AB (ref 8.9–10.3)
CO2: 25 mmol/L (ref 22–32)
Chloride: 98 mmol/L — ABNORMAL LOW (ref 101–111)
Creatinine, Ser: 1 mg/dL (ref 0.44–1.00)
GFR, EST AFRICAN AMERICAN: 53 mL/min — AB (ref >60)
GFR, EST NON AFRICAN AMERICAN: 45 mL/min — AB (ref >60)
Glucose, Bld: 91 mg/dL (ref 65–99)
Potassium: 4.4 mmol/L (ref 3.5–5.1)
Sodium: 131 mmol/L — ABNORMAL LOW (ref 135–145)

## 2016-01-26 MED ORDER — TRAMADOL HCL 50 MG PO TABS
50.0000 mg | ORAL_TABLET | Freq: Four times a day (QID) | ORAL | Status: DC | PRN
  Administered 2016-01-26 – 2016-01-29 (×2): 50 mg via ORAL
  Filled 2016-01-26 (×2): qty 1

## 2016-01-26 NOTE — Progress Notes (Signed)
PROGRESS NOTE    [name]  GNF:[id number] DOB: 11/16/1914 DOA: 01/25/2016 PCP: Nadean CorwinMCKEOWN,WILLIAM DAVID, MD     Brief Narrative:   82 y.o. WF PMHx CVA, Syncope and Collapse, A-Fib,, HTN, first-degree AV block,  Hypothyroidism, Prediabetes  Presenting with worsening leg condition.  Thursday of last week, she was looking in the freezer and a pack of meat fell down and hit her on the leg (she still cooks all of her meals).  Grandson brought her to the ER and she was sent home following negative xrays.  Sent to PCP Monday and had drainage of hematoma was started on antibiotics (Doxycycline) - extremely painful but no obvious infection, just trying to head off infection at that time per family report.  Unable to get into wound care center - [facility].  It has been getting worse- surrounding erythema is growing.  No fevers.  Never been a big eater but eating as well as usual.  Had a fall about 6 weeks ago.  Had pain and was increased from Tramadol to hydrocodone and it created constipation and was hospitalized 8/11-13.  Was given Augmentin for lung opacities, had severe diarrhea.   Subjective: 9/2 A/O 4. NAD. States LLE significantly improved. States very active at home. Lives alone but has good support from grandchildren who take turns staying with her. Ambulates on her home/with walker at times.     Assessment & Plan:   Principal Problem:   Cellulitis Active Problems:   Hypothyroidism   Hematoma and contusion   Hyponatremia   AKI (acute kidney injury) (HCC)   Hematoma and surrounding cellulitis  -Continue Clindamycin: Change to PO on 9/3 -Wound care evaluation pending: Debridement required?  -Blood culture pending -Patient with h/o sensitivity to pain medication so will start with low-dose (1 mg) morphine q4h prn pain and escalate if tolerated as needed -Recurrent shoulder state patient was unable to obtain appointment with outpatient wound care until  [place]. On Tuesday will need to either obtain earlier appointment or home health/RN. -Acidophilus daily  AKI/Hyponatremia(Baseline Cr 1-1.17) -Persistent hyponatremia since 01/04/16, worsening -Currently, appears to have prerenal azotemia which is contributing  Lab Results  Component Value Date   CREATININE 1.00 01/26/2016   CREATININE 1.17 (H) 01/25/2016   CREATININE 0.85 01/10/2016   -AKI resolved -Hyponatremia resolving  -HTN -Lisinopril 5 mg daily   Hypothyroidism -Continue Synthroid at home dose -Normal TSH on 01/05/16        DVT prophylaxis:  Lovenox - but need to monitor hematoma to ensure it is not worsening (appears to be primarily old/coagulated blood so should be reasonable to use Lovenox for now - and SCDs would not be an option on the left) Code Status: DO NOT RESUSCITATE Family Communication: Grandchildren present for discussion of plan Disposition Plan: Resolution cellulitis   Consultants:  Wound care pending  Procedures/Significant Events:  None  Cultures 9/1 blood 2 pending    Antimicrobials: 9/1 clindamycin>>   Devices    LINES / TUBES:      Continuous Infusions: . lactated ringers 50 mL/hr at 01/25/16 2318     Objective: Vitals:   01/25/16 2015 01/25/16 2030 01/25/16 2159 01/26/16 0429  BP: 131/65 140/60 (!) 162/78 126/62  Pulse: 85 67 71 64  Resp:      Temp:   97.5 F (36.4 C) 98.5 F (36.9 C)  TempSrc:   Oral Oral  SpO2: 99% 100% 98% 96%    Intake/Output Summary (Last  24 hours) at 01/26/16 0828 Last data filed at 01/26/16 0500  Gross per 24 hour  Intake              335 ml  Output                0 ml  Net              335 ml   There were no vitals filed for this visit.  Examination:  General: A/O 4, NAD, No acute respiratory distress Eyes: negative scleral hemorrhage, negative anisocoria, negative icterus ENT: Negative Runny nose, negative gingival bleeding, Neck:  Negative scars,  masses, torticollis, lymphadenopathy, JVD Lungs: Clear to auscultation bilaterally without wheezes or crackles Cardiovascular: Regular rate and rhythm without murmur gallop or rub normal S1 and S2 Abdomen: negative abdominal pain, nondistended, positive soft, bowel sounds, no rebound, no ascites, no appreciable mass Extremities: LLE large area of what appears to be coagulation of old blood underneath thin skin surrounded by significant area of erythema, hot to the touch, mildly tender to palpation (significant improved per patient) Skin: See extremities Psychiatric:  Negative depression, negative anxiety, negative fatigue, negative mania  Central nervous system:  Cranial nerves II through XII intact, tongue/uvula midline, all extremities muscle strength 5/5, sensation intact throughout, negative dysarthria, negative expressive aphasia, negative receptive aphasia.  .     Data Reviewed: Care during the described time interval was provided by me .  I have reviewed this patient's available data, including medical history, events of note, physical examination, and all test results as part of my evaluation. I have personally reviewed and interpreted all radiology studies.  CBC:  Recent Labs Lab 01/25/16 1802 01/26/16 0230  WBC 10.7* 8.9  NEUTROABS 5.8  --   HGB 12.1 10.4*  HCT 37.2 32.3*  MCV 95.1 95.6  PLT 385 331   Basic Metabolic Panel:  Recent Labs Lab 01/25/16 1802 01/26/16 0230  NA 128* 131*  K 4.6 4.4  CL 94* 98*  CO2 26 25  GLUCOSE 128* 91  BUN 19 18  CREATININE 1.17* 1.00  CALCIUM 9.4 8.8*   GFR: Estimated Creatinine Clearance: 24.3 mL/min (by C-G formula based on SCr of 1 mg/dL). Liver Function Tests:  Recent Labs Lab 01/25/16 1802  AST 27  ALT 14  ALKPHOS 65  BILITOT 0.9  PROT 6.5  ALBUMIN 3.6   No results for input(s): LIPASE, AMYLASE in the last 168 hours. No results for input(s): AMMONIA in the last 168 hours. Coagulation Profile: No results for  input(s): INR, PROTIME in the last 168 hours. Cardiac Enzymes: No results for input(s): CKTOTAL, CKMB, CKMBINDEX, TROPONINI in the last 168 hours. BNP (last 3 results) No results for input(s): PROBNP in the last 8760 hours. HbA1C: No results for input(s): HGBA1C in the last 72 hours. CBG: No results for input(s): GLUCAP in the last 168 hours. Lipid Profile: No results for input(s): CHOL, HDL, LDLCALC, TRIG, CHOLHDL, LDLDIRECT in the last 72 hours. Thyroid Function Tests: No results for input(s): TSH, T4TOTAL, FREET4, T3FREE, THYROIDAB in the last 72 hours. Anemia Panel: No results for input(s): VITAMINB12, FOLATE, FERRITIN, TIBC, IRON, RETICCTPCT in the last 72 hours. Sepsis Labs: No results for input(s): PROCALCITON, LATICACIDVEN in the last 168 hours.  No results found for this or any previous visit (from the past 240 hour(s)).       Radiology Studies: No results found.      Scheduled Meds: . aspirin EC  81 mg Oral QHS  .  clindamycin (CLEOCIN) IV  600 mg Intravenous Q8H  . DULoxetine  60 mg Oral Daily  . enoxaparin (LOVENOX) injection  30 mg Subcutaneous Daily  . levothyroxine  75 mcg Oral QAC breakfast  . lisinopril  5 mg Oral Daily  . multivitamin  1 tablet Oral Daily  . pregabalin  50 mg Oral QHS   Continuous Infusions: . lactated ringers 50 mL/hr at 01/25/16 2318     LOS: 0 days    Time spent:40 min    Nelvin Tomb, Roselind Messier, MD [facility] Pager [phone]  If 7PM-7AM, please contact night-coverage [email or web address] Password [facility] 01/26/2016, 8:28 AM

## 2016-01-27 DIAGNOSIS — E038 Other specified hypothyroidism: Secondary | ICD-10-CM

## 2016-01-27 DIAGNOSIS — E871 Hypo-osmolality and hyponatremia: Secondary | ICD-10-CM | POA: Diagnosis not present

## 2016-01-27 DIAGNOSIS — L03116 Cellulitis of left lower limb: Secondary | ICD-10-CM | POA: Diagnosis not present

## 2016-01-27 DIAGNOSIS — N179 Acute kidney failure, unspecified: Secondary | ICD-10-CM | POA: Diagnosis not present

## 2016-01-27 DIAGNOSIS — T148 Other injury of unspecified body region: Secondary | ICD-10-CM | POA: Diagnosis not present

## 2016-01-27 MED ORDER — CLINDAMYCIN HCL 300 MG PO CAPS
600.0000 mg | ORAL_CAPSULE | Freq: Three times a day (TID) | ORAL | Status: DC
  Administered 2016-01-27 – 2016-01-29 (×5): 600 mg via ORAL
  Filled 2016-01-27 (×5): qty 2

## 2016-01-27 NOTE — Progress Notes (Signed)
PROGRESS NOTE    [name]  WUJ:[id number] DOB: 1914/07/22 DOA: 01/25/2016 PCP: Nadean Corwin, MD     Brief Narrative:   82 y.o. WF PMHx CVA, Syncope and Collapse, A-Fib,, HTN, first-degree AV block,  Hypothyroidism, Prediabetes  Presenting with worsening leg condition.  Thursday of last week, she was looking in the freezer and a pack of meat fell down and hit her on the leg (she still cooks all of her meals).  Grandson brought her to the ER and she was sent home following negative xrays.  Sent to PCP Monday and had drainage of hematoma was started on antibiotics (Doxycycline) - extremely painful but no obvious infection, just trying to head off infection at that time per family report.  Unable to get into wound care center - has appt at [facility] next Tuesday.  It has been getting worse- surrounding erythema is growing.  No fevers.  Never been a big eater but eating as well as usual.  Had a fall about 6 weeks ago.  Had pain and was increased from Tramadol to hydrocodone and it created constipation and was hospitalized 8/11-13.  Was given Augmentin for lung opacities, had severe diarrhea.   Subjective: 9/3 A/O 4. NAD. States LLE significantly improved. States very active at home. Lives alone but has good support from children and grandchildren who take turns staying with her. Ambulates on her home/with walker at times.     Assessment & Plan:   Principal Problem:   Cellulitis Active Problems:   Hypothyroidism   Hematoma and contusion   Hyponatremia   AKI (acute kidney injury) (HCC)   Hematoma and surrounding cellulitis  -Continue Clindamycin: Change to PO on 9/3. -Wound care evaluation pending: Debridement required?  -Blood culture pending -Unable to obtain appointment with outpatient wound care until September 26 in [place]. On Tuesday will need to either obtain earlier appointment or home health/RN. -Acidophilus daily -Wound care still has not seen patient.  Counseled patient family considering her extremely thin skin believe we would do more harm debriding. Recommend continuing to allow eschar to form and discharge home when appropriate with outpatient wound care.  AKI/Hyponatremia(Baseline Cr 1-1.17) -Persistent hyponatremia since 01/04/16, worsening -Currently, appears to have prerenal azotemia which is contributing  Lab Results  Component Value Date   CREATININE 1.00 01/26/2016   CREATININE 1.17 (H) 01/25/2016   CREATININE 0.85 01/10/2016   -AKI resolved -Hyponatremia resolving  -HTN -Lisinopril 5 mg daily   Hypothyroidism -Continue Synthroid at home dose -Normal TSH on 01/05/16        DVT prophylaxis:  Lovenox - but need to monitor hematoma to ensure it is not worsening (appears to be primarily old/coagulated blood so should be reasonable to use Lovenox for now - and SCDs would not be an option on the left) Code Status: DO NOT RESUSCITATE Family Communication: Grandchildren present for discussion of plan Disposition Plan: Resolution cellulitis: Discharge on Tuesday after wound care follow-up arranged.    Consultants:  Wound care pending  Procedures/Significant Events:  None  Cultures 9/1 blood 2 pending    Antimicrobials: 9/1 clindamycin>>   Devices    LINES / TUBES:      Continuous Infusions: . lactated ringers 50 mL/hr at 01/25/16 2318     Objective: Vitals:   01/25/16 2015 01/25/16 2030 01/25/16 2159 01/26/16 0429  BP: 131/65 140/60 (!) 162/78 126/62  Pulse: 85 67 71 64  Resp:      Temp:   97.5 F (36.4 C) 98.5 F (  36.9 C)  TempSrc:   Oral Oral  SpO2: 99% 100% 98% 96%    Intake/Output Summary (Last 24 hours) at 01/26/16 16100828 Last data filed at 01/26/16 0500  Gross per 24 hour  Intake              335 ml  Output                0 ml  Net              335 ml   There were no vitals filed for this visit.  Examination:  General: A/O 4, NAD, No acute respiratory distress Eyes:  negative scleral hemorrhage, negative anisocoria, negative icterus ENT: Negative Runny nose, negative gingival bleeding, Neck:  Negative scars, masses, torticollis, lymphadenopathy, JVD Lungs: Clear to auscultation bilaterally without wheezes or crackles Cardiovascular: Regular rate and rhythm without murmur gallop or rub normal S1 and S2 Abdomen: negative abdominal pain, nondistended, positive soft, bowel sounds, no rebound, no ascites, no appreciable mass Extremities: LLE large area of what appears to be coagulation of old blood underneath thin skin surrounded by significant area of erythema, hot to the touch, mildly tender to palpation (significant improved per patient) Skin: See extremities Psychiatric:  Negative depression, negative anxiety, negative fatigue, negative mania  Central nervous system:  Cranial nerves II through XII intact, tongue/uvula midline, all extremities muscle strength 5/5, sensation intact throughout, negative dysarthria, negative expressive aphasia, negative receptive aphasia.  .     Data Reviewed: Care during the described time interval was provided by me .  I have reviewed this patient's available data, including medical history, events of note, physical examination, and all test results as part of my evaluation. I have personally reviewed and interpreted all radiology studies.  CBC:  Recent Labs Lab 01/25/16 1802 01/26/16 0230  WBC 10.7* 8.9  NEUTROABS 5.8  --   HGB 12.1 10.4*  HCT 37.2 32.3*  MCV 95.1 95.6  PLT 385 331   Basic Metabolic Panel:  Recent Labs Lab 01/25/16 1802 01/26/16 0230  NA 128* 131*  K 4.6 4.4  CL 94* 98*  CO2 26 25  GLUCOSE 128* 91  BUN 19 18  CREATININE 1.17* 1.00  CALCIUM 9.4 8.8*   GFR: Estimated Creatinine Clearance: 24.3 mL/min (by C-G formula based on SCr of 1 mg/dL). Liver Function Tests:  Recent Labs Lab 01/25/16 1802  AST 27  ALT 14  ALKPHOS 65  BILITOT 0.9  PROT 6.5  ALBUMIN 3.6   No results for  input(s): LIPASE, AMYLASE in the last 168 hours. No results for input(s): AMMONIA in the last 168 hours. Coagulation Profile: No results for input(s): INR, PROTIME in the last 168 hours. Cardiac Enzymes: No results for input(s): CKTOTAL, CKMB, CKMBINDEX, TROPONINI in the last 168 hours. BNP (last 3 results) No results for input(s): PROBNP in the last 8760 hours. HbA1C: No results for input(s): HGBA1C in the last 72 hours. CBG: No results for input(s): GLUCAP in the last 168 hours. Lipid Profile: No results for input(s): CHOL, HDL, LDLCALC, TRIG, CHOLHDL, LDLDIRECT in the last 72 hours. Thyroid Function Tests: No results for input(s): TSH, T4TOTAL, FREET4, T3FREE, THYROIDAB in the last 72 hours. Anemia Panel: No results for input(s): VITAMINB12, FOLATE, FERRITIN, TIBC, IRON, RETICCTPCT in the last 72 hours. Sepsis Labs: No results for input(s): PROCALCITON, LATICACIDVEN in the last 168 hours.  No results found for this or any previous visit (from the past 240 hour(s)).  Radiology Studies: No results found.      Scheduled Meds: . aspirin EC  81 mg Oral QHS  . clindamycin (CLEOCIN) IV  600 mg Intravenous Q8H  . DULoxetine  60 mg Oral Daily  . enoxaparin (LOVENOX) injection  30 mg Subcutaneous Daily  . levothyroxine  75 mcg Oral QAC breakfast  . lisinopril  5 mg Oral Daily  . multivitamin  1 tablet Oral Daily  . pregabalin  50 mg Oral QHS   Continuous Infusions: . lactated ringers 50 mL/hr at 01/25/16 2318     LOS: 0 days    Time spent:40 min    WOODS, Roselind Messier, MD [facility] Pager [phone]  If 7PM-7AM, please contact night-coverage [email or web address] Password [facility] 01/26/2016, 8:28 AM

## 2016-01-27 NOTE — Progress Notes (Signed)
RN attempted to make contact with wound care, [facility] has no one on call for today 01/27/2016. System Operator contacted in order to receive contact information. Paged [phone] at 1455.

## 2016-01-28 ENCOUNTER — Observation Stay (HOSPITAL_COMMUNITY): Payer: Medicare Other

## 2016-01-28 ENCOUNTER — Encounter (HOSPITAL_COMMUNITY): Payer: Self-pay

## 2016-01-28 DIAGNOSIS — E871 Hypo-osmolality and hyponatremia: Secondary | ICD-10-CM

## 2016-01-28 DIAGNOSIS — L03116 Cellulitis of left lower limb: Secondary | ICD-10-CM | POA: Diagnosis not present

## 2016-01-28 DIAGNOSIS — T148 Other injury of unspecified body region: Secondary | ICD-10-CM

## 2016-01-28 DIAGNOSIS — E032 Hypothyroidism due to medicaments and other exogenous substances: Secondary | ICD-10-CM

## 2016-01-28 DIAGNOSIS — S8012XA Contusion of left lower leg, initial encounter: Secondary | ICD-10-CM | POA: Diagnosis not present

## 2016-01-28 MED ORDER — SODIUM CHLORIDE 0.9 % IV SOLN
INTRAVENOUS | Status: AC
  Administered 2016-01-28: 13:00:00 via INTRAVENOUS

## 2016-01-28 MED ORDER — IOPAMIDOL (ISOVUE-300) INJECTION 61%
INTRAVENOUS | Status: AC
  Administered 2016-01-28: 75 mL
  Filled 2016-01-28: qty 75

## 2016-01-28 NOTE — Consult Note (Addendum)
WOC Nurse wound consult note Reason for Consult: Consult requested for left leg hematoma.  This occurred during a fall prior to admission several days ago, according to the EMR. Wound type: Full thickness Measurement: 9X4.8cm to left anterior calf Wound bed: 100% eschar, raised above skin level, some areas are fluctuant, most is tightly adhered Drainage (amount, consistency, odor) No odor or drainage Periwound: Intact skin surrounding the location Dressing procedure/placement/frequency: [facility]; recommend surgical consult for possible debridement of nonviable tissue.  Discussed plan of care with primary team via phone call. Please re-consult if further assistance is needed.  Thank-you,  Cammie Mcgeeawn Leondra Cullin MSN, RN, CWOCN, [place]-AP, CNS [phone]

## 2016-01-28 NOTE — Care Management Obs Status (Signed)
MEDICARE OBSERVATION STATUS NOTIFICATION   Patient Details  Name: [name] MRN: [id number] Date of Birth: 04/14/1915   Medicare Observation Status Notification Given:  Yes    Lawerance Sabalebbie Stepheny Canal, RN 01/28/2016, 9:41 AM

## 2016-01-28 NOTE — Progress Notes (Signed)
PROGRESS NOTE    [name]  WUJ:[id number] DOB: 10/14/1914 DOA: 01/25/2016 PCP: Nadean CorwinMCKEOWN,WILLIAM DAVID, MD     Brief Narrative:   82 y.o. WF PMHx CVA, Syncope and Collapse, A-Fib,, HTN, first-degree AV block,  Hypothyroidism, Prediabetes  Presenting with worsening leg condition.  Thursday of last week, she was looking in the freezer and a pack of meat fell down and hit her on the leg (she still cooks all of her meals).  Grandson brought her to the ER and she was sent home following negative xrays.  Sent to PCP Monday and had drainage of hematoma was started on antibiotics (Doxycycline) - extremely painful but no obvious infection, just trying to head off infection at that time per family report.  Unable to get into wound care center - [facility].  It has been getting worse- surrounding erythema is growing.  No fevers.  Never been a big eater but eating as well as usual.  Had a fall about 6 weeks ago.  Had pain and was increased from Tramadol to hydrocodone and it created constipation and was hospitalized 8/11-13.  Was given Augmentin for lung opacities, had severe diarrhea.   Subjective: 9/3 A/O 4. NAD. States LLE significantly improved. States very active at home. Lives alone but has good support from children and grandchildren who take turns staying with her. Ambulates on her home/with walker at times.     Assessment & Plan:   Principal Problem:   Cellulitis Active Problems:   Hypothyroidism   Hematoma and contusion   Hyponatremia   AKI (acute kidney injury) (HCC)   Hematoma and surrounding cellulitis  Patient had left tibia/fibula x-ray on 8/24, white blood cell count has improved -Continue Clindamycin: Change to PO on 9/3. Blood culture from 9/1 no growth so far -Unable to obtain appointment with outpatient wound care until September [place] in [place]. On Tuesday will need to either obtain earlier appointment or home health/RN. -Acidophilus  daily -Wound care has nothing to offer Patient may need orthopedic evaluation, first order CT to r/o abscess    AKI/Hyponatremia(Baseline Cr 1-1.17) -Persistent hyponatremia since 01/04/16, worsening -Currently, appears to have prerenal azotemia which is contributing -Hyponatremia resolving   -HTN -Lisinopril 5 mg daily    Hypothyroidism -Continue Synthroid at home dose -Normal TSH on 01/05/16  Atrial fibrillation-Chad vasc 2 score of at least 5not on anticoagulation to fall risk  History of CVA-on aspirin  Gastroesophageal reflux disease-start PPI  Hypothyroidism-continue Synthroid, TSH 2.4 on 01/05/16         DVT prophylaxis:  Lovenox - but need to monitor hematoma to ensure it is not worsening (appears to be primarily old/coagulated blood so should be reasonable to use Lovenox for now - and SCDs would not be an option on the left) Code Status: DO NOT RESUSCITATE Family Communication: Grandchildren present for discussion of plan Disposition Plan: PT OT evaluation,: Discharge on Tuesday after wound care follow-up arranged.    Consultants:  Wound care pending  Procedures/Significant Events:  None  Cultures 9/1 blood 2 pending    Antimicrobials: 9/1 clindamycin>>   Devices    LINES / TUBES:      Continuous Infusions: . lactated ringers 50 mL/hr at 01/25/16 2318     Objective: Vitals:   01/25/16 2015 01/25/16 2030 01/25/16 2159 01/26/16 0429  BP: 131/65 140/60 (!) 162/78 126/62  Pulse: 85 67 71 64  Resp:      Temp:   97.5 F (36.4 C) 98.5  F (36.9 C)  TempSrc:   Oral Oral  SpO2: 99% 100% 98% 96%    Intake/Output Summary (Last 24 hours) at 01/26/16 1610 Last data filed at 01/26/16 0500  Gross per 24 hour  Intake              335 ml  Output                0 ml  Net              335 ml   There were no vitals filed for this visit.  Examination:  General: A/O 4, NAD, No acute respiratory distress Eyes: negative scleral  hemorrhage, negative anisocoria, negative icterus ENT: Negative Runny nose, negative gingival bleeding, Neck:  Negative scars, masses, torticollis, lymphadenopathy, JVD Lungs: Clear to auscultation bilaterally without wheezes or crackles Cardiovascular: Regular rate and rhythm without murmur gallop or rub normal S1 and S2 Abdomen: negative abdominal pain, nondistended, positive soft, bowel sounds, no rebound, no ascites, no appreciable mass Extremities: LLE large area of what appears to be coagulation of old blood underneath thin skin surrounded by significant area of erythema, hot to the touch, mildly tender to palpation (significant improved per patient) Skin: See extremities Psychiatric:  Negative depression, negative anxiety, negative fatigue, negative mania  Central nervous system:  Cranial nerves II through XII intact, tongue/uvula midline, all extremities muscle strength 5/5, sensation intact throughout, negative dysarthria, negative expressive aphasia, negative receptive aphasia.  .     Data Reviewed: Care during the described time interval was provided by me .  I have reviewed this patient's available data, including medical history, events of note, physical examination, and all test results as part of my evaluation. I have personally reviewed and interpreted all radiology studies.  CBC:  Recent Labs Lab 01/25/16 1802 01/26/16 0230  WBC 10.7* 8.9  NEUTROABS 5.8  --   HGB 12.1 10.4*  HCT 37.2 32.3*  MCV 95.1 95.6  PLT 385 331   Basic Metabolic Panel:  Recent Labs Lab 01/25/16 1802 01/26/16 0230  NA 128* 131*  K 4.6 4.4  CL 94* 98*  CO2 26 25  GLUCOSE 128* 91  BUN 19 18  CREATININE 1.17* 1.00  CALCIUM 9.4 8.8*   GFR: Estimated Creatinine Clearance: 24.3 mL/min (by C-G formula based on SCr of 1 mg/dL). Liver Function Tests:  Recent Labs Lab 01/25/16 1802  AST 27  ALT 14  ALKPHOS 65  BILITOT 0.9  PROT 6.5  ALBUMIN 3.6   No results for input(s): LIPASE,  AMYLASE in the last 168 hours. No results for input(s): AMMONIA in the last 168 hours. Coagulation Profile: No results for input(s): INR, PROTIME in the last 168 hours. Cardiac Enzymes: No results for input(s): CKTOTAL, CKMB, CKMBINDEX, TROPONINI in the last 168 hours. BNP (last 3 results) No results for input(s): PROBNP in the last 8760 hours. HbA1C: No results for input(s): HGBA1C in the last 72 hours. CBG: No results for input(s): GLUCAP in the last 168 hours. Lipid Profile: No results for input(s): CHOL, HDL, LDLCALC, TRIG, CHOLHDL, LDLDIRECT in the last 72 hours. Thyroid Function Tests: No results for input(s): TSH, T4TOTAL, FREET4, T3FREE, THYROIDAB in the last 72 hours. Anemia Panel: No results for input(s): VITAMINB12, FOLATE, FERRITIN, TIBC, IRON, RETICCTPCT in the last 72 hours. Sepsis Labs: No results for input(s): PROCALCITON, LATICACIDVEN in the last 168 hours.  No results found for this or any previous visit (from the past 240 hour(s)).  Radiology Studies: No results found.      Scheduled Meds: . aspirin EC  81 mg Oral QHS  . clindamycin (CLEOCIN) IV  600 mg Intravenous Q8H  . DULoxetine  60 mg Oral Daily  . enoxaparin (LOVENOX) injection  30 mg Subcutaneous Daily  . levothyroxine  75 mcg Oral QAC breakfast  . lisinopril  5 mg Oral Daily  . multivitamin  1 tablet Oral Daily  . pregabalin  50 mg Oral QHS   Continuous Infusions: . lactated ringers 50 mL/hr at 01/25/16 2318     LOS: 0 days    Time spent:40 min    Richarda Overlie MD  [facility] Pager [phone]  If 7PM-7AM, please contact night-coverage [email or web address] Password [facility] 01/26/2016, 8:28 AM

## 2016-01-29 ENCOUNTER — Ambulatory Visit: Payer: Self-pay | Admitting: Internal Medicine

## 2016-01-29 DIAGNOSIS — L03116 Cellulitis of left lower limb: Secondary | ICD-10-CM | POA: Diagnosis not present

## 2016-01-29 DIAGNOSIS — T148 Other injury of unspecified body region: Secondary | ICD-10-CM | POA: Diagnosis not present

## 2016-01-29 DIAGNOSIS — E032 Hypothyroidism due to medicaments and other exogenous substances: Secondary | ICD-10-CM | POA: Diagnosis not present

## 2016-01-29 DIAGNOSIS — I87332 Chronic venous hypertension (idiopathic) with ulcer and inflammation of left lower extremity: Secondary | ICD-10-CM | POA: Diagnosis not present

## 2016-01-29 DIAGNOSIS — E871 Hypo-osmolality and hyponatremia: Secondary | ICD-10-CM | POA: Diagnosis not present

## 2016-01-29 LAB — COMPREHENSIVE METABOLIC PANEL
ALT: 12 U/L — ABNORMAL LOW (ref 14–54)
AST: 21 U/L (ref 15–41)
Albumin: 2.7 g/dL — ABNORMAL LOW (ref 3.5–5.0)
Alkaline Phosphatase: 50 U/L (ref 38–126)
Anion gap: 5 (ref 5–15)
BUN: 20 mg/dL (ref 6–20)
CALCIUM: 8.5 mg/dL — AB (ref 8.9–10.3)
CHLORIDE: 102 mmol/L (ref 101–111)
CO2: 26 mmol/L (ref 22–32)
CREATININE: 0.88 mg/dL (ref 0.44–1.00)
GFR, EST NON AFRICAN AMERICAN: 53 mL/min — AB (ref >60)
GLUCOSE: 92 mg/dL (ref 65–99)
POTASSIUM: 4.2 mmol/L (ref 3.5–5.1)
SODIUM: 133 mmol/L — AB (ref 135–145)
TOTAL PROTEIN: 5.3 g/dL — AB (ref 6.5–8.1)
Total Bilirubin: 0.4 mg/dL (ref 0.3–1.2)

## 2016-01-29 LAB — CBC
HCT: 32.1 % — ABNORMAL LOW (ref 36.0–46.0)
HEMOGLOBIN: 10.1 g/dL — AB (ref 12.0–15.0)
MCH: 30.1 pg (ref 26.0–34.0)
MCHC: 31.5 g/dL (ref 30.0–36.0)
MCV: 95.8 fL (ref 78.0–100.0)
PLATELETS: 324 10*3/uL (ref 150–400)
RBC: 3.35 MIL/uL — ABNORMAL LOW (ref 3.87–5.11)
RDW: 13.9 % (ref 11.5–15.5)
WBC: 8.8 10*3/uL (ref 4.0–10.5)

## 2016-01-29 MED ORDER — SILVER SULFADIAZINE 1 % EX CREA
TOPICAL_CREAM | Freq: Every day | CUTANEOUS | Status: DC
  Filled 2016-01-29: qty 85

## 2016-01-29 MED ORDER — SILVER SULFADIAZINE 1 % EX CREA: TOPICAL_CREAM | Freq: Every day | CUTANEOUS | 0 refills | Status: DC

## 2016-01-29 MED ORDER — CLINDAMYCIN HCL 300 MG PO CAPS: 300.0000 mg | ORAL_CAPSULE | Freq: Four times a day (QID) | ORAL | Status: DC

## 2016-01-29 MED ORDER — CLINDAMYCIN HCL 300 MG PO CAPS: 300.0000 mg | ORAL_CAPSULE | Freq: Four times a day (QID) | ORAL | 0 refills | Status: AC

## 2016-01-29 MED ORDER — SILVER SULFADIAZINE 1 % EX CREA: TOPICAL_CREAM | Freq: Every day | CUTANEOUS | Status: DC

## 2016-01-29 NOTE — Progress Notes (Signed)
PROGRESS NOTE    [name]  ZOX:[id number] DOB: 15-Dec-1914 DOA: 01/25/2016 PCP: Nadean Corwin, MD     Brief Narrative:   82 y.o. WF PMHx CVA, Syncope and Collapse, A-Fib,, HTN, first-degree AV block,  Hypothyroidism, Prediabetes Presenting with worsening leg condition.  Thursday of last week, she was looking in the freezer and a pack of meat fell down and hit her on the leg (she still cooks all of her meals).  Grandson brought her to the ER and she was sent home following negative xrays.  Sent to PCP Monday and had drainage of hematoma was started on antibiotics (Doxycycline) - extremely painful but no obvious infection, just trying to head off infection at that time per family report.  Unable to get into wound care center - has appt at [facility] next Tuesday.  It has been getting worse- surrounding erythema is growing.  No fevers.  Never been a big eater but eating as well as usual.  Had a fall about 6 weeks ago.  Had pain and was increased from Tramadol to hydrocodone and it created constipation and was hospitalized 8/11-13.  Was given Augmentin for lung opacities, had severe diarrhea. Presented with cellulitis/hematoma of the leg.   Subjective:  Patient's daughter is by the bedside, feels that the patient's leg looks a little bit better    Assessment & Plan:   Principal Problem:   Cellulitis Active Problems:   Hypothyroidism   Hematoma and contusion   Hyponatremia   AKI (acute kidney injury) (HCC)   Hematoma and surrounding cellulitis  Patient had left tibia/fibula x-ray on 8/24, white blood cell count has improved -Continue Clindamycin: Change to PO on 9/5. Blood culture from 9/1 no growth so far -Unable to obtain appointment with outpatient wound care until September 26 in [place]. On Tuesday will need to either obtain earlier appointment or home health/RN. -Acidophilus daily WOC feels this is beyond the scope of treatment CT of the leg shows complex fluid  collection measuring 7.7 x 3.8 cm No significant cellulitis  Seen by Dr. Lajoyce Corners, recommended  start with daily  cleansing with Silvadene dressing changes daily  Follow-up with Dr. Lajoyce Corners in one week    AKI/Hyponatremia(Baseline Cr 1-1.17) -Persistent hyponatremia since 01/04/16, slightly improved -Currently, appears to have prerenal azotemia which is also improving     -HTN -Lisinopril 5 mg daily    Hypothyroidism -Continue Synthroid at home dose -Normal TSH on 01/05/16  Atrial fibrillation-Chad vasc 2 score of at least , not on anticoagulation to fall risk  History of CVA-on aspirin  Gastroesophageal reflux disease-start PPI  Hypothyroidism-continue Synthroid, TSH 2.4 on 01/05/16         DVT prophylaxis:  Lovenox - but need to monitor hematoma to ensure it is not worsening (appears to be primarily old/coagulated blood so should be reasonable to use Lovenox for now - and SCDs would not be an option on the left) Code Status: DO NOT RESUSCITATE Family Communication: Grandchildren present for discussion of plan  Disposition Plan:  Anticipate patient will discharge home tomorrow with home health    Consultants:  Wound care  Orthopedics  Procedures/Significant Events:  None  Cultures 9/1 blood 2 pending    Antimicrobials: 9/1 clindamycin>>   Devices    LINES / TUBES:      Continuous Infusions: . lactated ringers 50 mL/hr at 01/25/16 2318     Objective: Vitals:   01/25/16 2015 01/25/16 2030 01/25/16 2159 01/26/16 0429  BP: 131/65 140/60 (!) 162/78  126/62  Pulse: 85 67 71 64  Resp:      Temp:   97.5 F (36.4 C) 98.5 F (36.9 C)  TempSrc:   Oral Oral  SpO2: 99% 100% 98% 96%    Intake/Output Summary (Last 24 hours) at 01/26/16 [id number] Last data filed at 01/26/16 0500  Gross per 24 hour  Intake              335 ml  Output                0 ml  Net              335 ml   There were no vitals filed for this visit.  Examination:  General: A/O  4, NAD, No acute respiratory distress Eyes: negative scleral hemorrhage, negative anisocoria, negative icterus ENT: Negative Runny nose, negative gingival bleeding, Neck:  Negative scars, masses, torticollis, lymphadenopathy, JVD Lungs: Clear to auscultation bilaterally without wheezes or crackles Cardiovascular: Regular rate and rhythm without murmur gallop or rub normal S1 and S2 Abdomen: negative abdominal pain, nondistended, positive soft, bowel sounds, no rebound, no ascites, no appreciable mass Extremities: LLE large area of what appears to be coagulation of old blood underneath thin skin surrounded by significant area of erythema, hot to the touch, mildly tender to palpation (significant improved per patient) Skin: See extremities Psychiatric:  Negative depression, negative anxiety, negative fatigue, negative mania  Central nervous system:  Cranial nerves II through XII intact, tongue/uvula midline, all extremities muscle strength 5/5, sensation intact throughout, negative dysarthria, negative expressive aphasia, negative receptive aphasia.  .     Data Reviewed: Care during the described time interval was provided by me .  I have reviewed this patient's available data, including medical history, events of note, physical examination, and all test results as part of my evaluation. I have personally reviewed and interpreted all radiology studies.  CBC:  Recent Labs Lab 01/25/16 1802 01/26/16 0230  WBC 10.7* 8.9  NEUTROABS 5.8  --   HGB 12.1 10.4*  HCT 37.2 32.3*  MCV 95.1 95.6  PLT 385 331   Basic Metabolic Panel:  Recent Labs Lab 01/25/16 1802 01/26/16 0230  NA 128* 131*  K 4.6 4.4  CL 94* 98*  CO2 26 25  GLUCOSE 128* 91  BUN 19 18  CREATININE 1.17* 1.00  CALCIUM 9.4 8.8*   GFR: Estimated Creatinine Clearance: 24.3 mL/min (by C-G formula based on SCr of 1 mg/dL). Liver Function Tests:  Recent Labs Lab 01/25/16 1802  AST 27  ALT 14  ALKPHOS 65  BILITOT 0.9   PROT 6.5  ALBUMIN 3.6   No results for input(s): LIPASE, AMYLASE in the last 168 hours. No results for input(s): AMMONIA in the last 168 hours. Coagulation Profile: No results for input(s): INR, PROTIME in the last 168 hours. Cardiac Enzymes: No results for input(s): CKTOTAL, CKMB, CKMBINDEX, TROPONINI in the last 168 hours. BNP (last 3 results) No results for input(s): PROBNP in the last 8760 hours. HbA1C: No results for input(s): HGBA1C in the last 72 hours. CBG: No results for input(s): GLUCAP in the last 168 hours. Lipid Profile: No results for input(s): CHOL, HDL, LDLCALC, TRIG, CHOLHDL, LDLDIRECT in the last 72 hours. Thyroid Function Tests: No results for input(s): TSH, T4TOTAL, FREET4, T3FREE, THYROIDAB in the last 72 hours. Anemia Panel: No results for input(s): VITAMINB12, FOLATE, FERRITIN, TIBC, IRON, RETICCTPCT in the last 72 hours. Sepsis Labs: No results for input(s): PROCALCITON, LATICACIDVEN in the last  168 hours.  No results found for this or any previous visit (from the past 240 hour(s)).       Radiology Studies: No results found.      Scheduled Meds: . aspirin EC  81 mg Oral QHS  . clindamycin (CLEOCIN) IV  600 mg Intravenous Q8H  . DULoxetine  60 mg Oral Daily  . enoxaparin (LOVENOX) injection  30 mg Subcutaneous Daily  . levothyroxine  75 mcg Oral QAC breakfast  . lisinopril  5 mg Oral Daily  . multivitamin  1 tablet Oral Daily  . pregabalin  50 mg Oral QHS   Continuous Infusions: . lactated ringers 50 mL/hr at 01/25/16 2318     LOS: 0 days    Time spent:40 min    Richarda Overlie MD  [facility] Pager [phone]  If 7PM-7AM, please contact night-coverage [email or web address] Password [facility] 01/26/2016, 8:28 AM

## 2016-01-29 NOTE — Care Management Obs Status (Signed)
MEDICARE OBSERVATION STATUS NOTIFICATION   Patient Details  Name: Cecilie Lowerslma M Landgren MRN: [id number] Date of Birth: 08/21/1914   Medicare Observation Status Notification Given:  Yes  Signed by daughter POA  Durenda GuthrieBrady, Monalisa Bayless Naomi, RN 01/29/2016, 11:04 AM

## 2016-01-29 NOTE — Discharge Summary (Signed)
Physician Discharge Summary  [name] MRN: [id number] DOB/AGE: 07/12/1914 82 y.o.  PCP: MCKEOWN,WILLIAM DAVID, MD   Admit date: 01/25/2016 Discharge date: 01/29/2016  Discharge Diagnoses:    Principal Problem:   Cellulitis of left lower extremity Active Problems:   Hypothyroidism   Hematoma   Hyponatremia   AKI (acute kidney injury) (HCC)    Follow-up recommendations Follow-up with PCP in 3-5 days , including all  additional recommended appointments as below Follow-up CBC, CMP in 3-5 days HHRN to continue dressing changes       Current Discharge Medication List    START taking these medications   Details  clindamycin (CLEOCIN) 300 MG capsule Take 1 capsule (300 mg total) by mouth every 6 (six) hours. Qty: 20 capsule, Refills: 0    silver sulfADIAZINE (SILVADENE) 1 % cream Apply topically daily. Qty: 50 g, Refills: 0      CONTINUE these medications which have NOT CHANGED   Details  acetaminophen (TYLENOL) 325 MG tablet Take 650 mg by mouth every 6 (six) hours as needed for mild pain (when Tramadol is taken).     aspirin 81 MG tablet Take 81 mg by mouth at bedtime.     beta carotene w/minerals (OCUVITE) tablet Take 1 tablet by mouth 2 (two) times daily.    Cholecalciferol (VITAMIN D3) 1000 UNITS CAPS Take 1,000 Units by mouth 2 (two) times daily.     docusate sodium (COLACE) 100 MG capsule Take 100 mg by mouth 2 (two) times daily as needed for mild constipation.  Refills: 0    DULoxetine (CYMBALTA) 60 MG capsule Take 1 capsule (60 mg total) by mouth daily. Qty: 30 capsule, Refills: 11    lisinopril (PRINIVIL,ZESTRIL) 5 MG tablet Take 1 tablet (5 mg total) by mouth daily. Qty: 90 tablet, Refills: 3    ondansetron (ZOFRAN) 4 MG tablet Take 1 tablet (4 mg total) by mouth every 6 (six) hours. Qty: 12 tablet, Refills: 0    pregabalin (LYRICA) 50 MG capsule Take 1 capsule (50 mg total) by mouth at bedtime. Qty: 105 capsule, Refills: 0    SYNTHROID 75 MCG  tablet TAKE 1 TABLET DAILY BEFORE BREAKFAST Qty: 90 tablet, Refills: 0    traMADol (ULTRAM) 50 MG tablet TAKE 1 TABLET BY MOUTH 3 TO 4 TIMES A DAY AS NEEDED FOR PAIN Qty: 100 tablet, Refills: 0         Discharge Condition:  stable  Discharge Instructions Get Medicines reviewed and adjusted: Please take all your medications with you for your next visit with your Primary MD  Please request your Primary MD to go over all hospital tests and procedure/radiological results at the follow up, please ask your Primary MD to get all Hospital records sent to his/her office.  If you experience worsening of your admission symptoms, develop shortness of breath, life threatening emergency, suicidal or homicidal thoughts you must seek medical attention immediately by calling 911 or calling your MD immediately if symptoms less severe.  You must read complete instructions/literature along with all the possible adverse reactions/side effects for all the Medicines you take and that have been prescribed to you. Take any new Medicines after you have completely understood and accpet all the possible adverse reactions/side effects.   Do not drive when taking Pain medications.   Do not take more than prescribed Pain, Sleep and Anxiety Medications  Special Instructions: If you have smoked or chewed Tobacco in the last 2 yrs please stop smoking, stop any regular Alcohol   and or any Recreational drug use.  Wear Seat belts while driving.  Please note  You were cared for by a hospitalist during your hospital stay. Once you are discharged, your primary care physician will handle any further medical issues. Please note that NO REFILLS for any discharge medications will be authorized once you are discharged, as it is imperative that you return to your primary care physician (or establish a relationship with a primary care physician if you do not have one) for your aftercare needs so that they can reassess your need  for medications and monitor your lab values.  Discharge Instructions    Diet - low sodium heart healthy    Complete by:  As directed   Increase activity slowly    Complete by:  As directed       Allergies  Allergen Reactions  . Augmentin [Amoxicillin-Pot Clavulanate] Diarrhea  . Ciprocin-Fluocin-Procin [Fluocinolone Acetonide] Nausea And Vomiting  . Ciprofloxacin Hcl Nausea And Vomiting  . Codeine Hives and Nausea And Vomiting  . Sulfa Drugs Cross Reactors Hives and Nausea And Vomiting      Disposition: 01-Home or Self Care   Consults:  ortho    Significant Diagnostic Studies:  Dg Chest 2 View  Result Date: 01/04/2016 CLINICAL DATA:  Acute onset of altered mental status. Possible syncope. Recent fall. Initial encounter. EXAM: CHEST  2 VIEW COMPARISON:  Chest radiograph performed 03/17/2014 FINDINGS: The lungs are well-aerated. Right apical opacity may reflect scarring or possibly underlying mass, somewhat more prominent than on prior studies. There is no evidence of pleural effusion or pneumothorax. The heart is mildly enlarged. No acute osseous abnormalities are seen. IMPRESSION: 1. Right apical opacity may reflect scarring or underlying mass, somewhat more prominent than on prior studies. Further evaluation may be considered if deemed clinically appropriate. Mild left basilar atelectasis noted. 2. Mild cardiomegaly. Electronically Signed   By: Jeffery  Chang M.D.   On: 01/04/2016 06:27   Dg Tibia/fibula Left  Result Date: 01/17/2016 CLINICAL DATA:  Hematoma after trauma EXAM: LEFT TIBIA AND FIBULA - 2 VIEW COMPARISON:  None FINDINGS: The patient's known hematoma is identified.  No underlying fracture. IMPRESSION: Hematoma without fracture. Electronically Signed   By: David  Williams III M.D   On: 01/17/2016 13:12   Ct Head Wo Contrast  Result Date: 01/04/2016 CLINICAL DATA:  Fall in bathroom with altered mental status EXAM: CT HEAD WITHOUT CONTRAST TECHNIQUE: Contiguous axial  images were obtained from the base of the skull through the vertex without intravenous contrast. COMPARISON:  06/28/2014 FINDINGS: Bony calvarium is intact. Mild atrophic changes are noted. Basal ganglia calcifications are seen. No findings to suggest acute hemorrhage, acute infarction or space-occupying mass lesion are noted. IMPRESSION: Mild atrophic changes without acute abnormality. Electronically Signed   By: Mark  Lukens M.D.   On: 01/04/2016 07:17   Ct Tibia Fibula Left W Contrast  Result Date: 01/28/2016 CLINICAL DATA:  Lower leg contusion 1 week ago with hematoma involving the right lower extremity. The hematoma is draining and there is surrounding cellulitis. EXAM: CT OF THE LEFT TIBIA AND FIBULA WITH CONTRAST TECHNIQUE: Standard CT imaging of the left lower extremity was performed with coronal and sagittal reformatted images. CONTRAST:  75mL ISOVUE-300 IOPAMIDOL (ISOVUE-300) INJECTION 61% COMPARISON:  Radiographs 01/17/2016 FINDINGS: There is a the cutaneous lesion which demonstrates high attenuation consistent with hematoma. It has the appearance of a large blister on the scan. No subcutaneous hematoma. Mild surrounding subcutaneous edema or inflammation below the lesion which   could suggest cellulitis. No drainable soft tissue abscess. No findings for myofasciitis or pyomyositis. The knee and ankle joints are maintained. No findings to suggest septic arthritis. No destructive bony changes to suggest osteomyelitis. Extensive vascular calcifications are noted. IMPRESSION: Cutaneous complex fluid collection has the appearance of a large blister. It measures 7.7 x 3.8 x 1.0 cm. No significant surrounding cellulitis or subcutaneous abscess. No findings for myofasciitis or pyomyositis. No findings for septic arthritis or osteomyelitis. Electronically Signed   By: P.  Gallerani M.D.   On: 01/28/2016 16:14   Dg Chest Port 1 View  Result Date: 01/06/2016 CLINICAL DATA:  Shortness of breath EXAM: PORTABLE  CHEST 1 VIEW COMPARISON:  Chest radiograph from one day prior. FINDINGS: Stable cardiomediastinal silhouette with mild cardiomegaly. No pneumothorax. Stable small left pleural effusion. No right pleural effusion. No overt pulmonary edema. Mild bibasilar lung opacities appear stable. IMPRESSION: 1. Stable mild cardiomegaly without overt pulmonary edema. 2. Stable small left pleural effusion. 3. Stable mild bibasilar lung opacities. Electronically Signed   By: Jason A Poff M.D.   On: 01/06/2016 07:19   Dg Chest Port 1 View  Result Date: 01/05/2016 CLINICAL DATA:  Shortness of breath EXAM: PORTABLE CHEST 1 VIEW COMPARISON:  Chest radiograph from one day prior. FINDINGS: Stable cardiomediastinal silhouette with top-normal heart size and aortic atherosclerosis. No pneumothorax. No pleural effusion. Patchy opacity at the left lateral lung base appears increased. No pulmonary edema. IMPRESSION: Increase patchy lateral left lung base opacity, worrisome for aspiration or pneumonia. Aortic atherosclerosis. Electronically Signed   By: Jason A Poff M.D.   On: 01/05/2016 07:15        There were no vitals filed for this visit.   Microbiology: Recent Results (from the past 240 hour(s))  Culture, blood (routine x 2)     Status: None (Preliminary result)   Collection Time: 01/25/16  8:25 PM  Result Value Ref Range Status   Specimen Description BLOOD RIGHT FOREARM  Final   Special Requests BOTTLES DRAWN AEROBIC AND ANAEROBIC 5CC  Final   Culture NO GROWTH 3 DAYS  Final   Report Status PENDING  Incomplete       Blood Culture    Component Value Date/Time   SDES BLOOD RIGHT FOREARM 01/25/2016 2025   SPECREQUEST BOTTLES DRAWN AEROBIC AND ANAEROBIC 5CC 01/25/2016 2025   CULT NO GROWTH 3 DAYS 01/25/2016 2025   REPTSTATUS PENDING 01/25/2016 2025      Labs: Results for orders placed or performed during the hospital encounter of 01/25/16 (from the past 48 hour(s))  Comprehensive metabolic panel      Status: Abnormal   Collection Time: 01/29/16  4:52 AM  Result Value Ref Range   Sodium 133 (L) 135 - 145 mmol/L   Potassium 4.2 3.5 - 5.1 mmol/L   Chloride 102 101 - 111 mmol/L   CO2 26 22 - 32 mmol/L   Glucose, Bld 92 65 - 99 mg/dL   BUN 20 6 - 20 mg/dL   Creatinine, Ser 0.88 0.44 - 1.00 mg/dL   Calcium 8.5 (L) 8.9 - 10.3 mg/dL   Total Protein 5.3 (L) 6.5 - 8.1 g/dL   Albumin 2.7 (L) 3.5 - 5.0 g/dL   AST 21 15 - 41 U/L   ALT 12 (L) 14 - 54 U/L   Alkaline Phosphatase 50 38 - 126 U/L   Total Bilirubin 0.4 0.3 - 1.2 mg/dL   GFR calc non Af Amer 53 (L) >60 mL/min   GFR calc Af Amer >60 >  60 mL/min    Comment: (NOTE) The eGFR has been calculated using the CKD EPI equation. This calculation has not been validated in all clinical situations. eGFR's persistently <60 mL/min signify possible Chronic Kidney Disease.    Anion gap 5 5 - 15  CBC     Status: Abnormal   Collection Time: 01/29/16  4:52 AM  Result Value Ref Range   WBC 8.8 4.0 - 10.5 K/uL   RBC 3.35 (L) 3.87 - 5.11 MIL/uL   Hemoglobin 10.1 (L) 12.0 - 15.0 g/dL   HCT 32.1 (L) 36.0 - 46.0 %   MCV 95.8 78.0 - 100.0 fL   MCH 30.1 26.0 - 34.0 pg   MCHC 31.5 30.0 - 36.0 g/dL   RDW 13.9 11.5 - 15.5 %   Platelets 324 150 - 400 K/uL     Lipid Panel     Component Value Date/Time   CHOL 197 08/13/2015 1303   TRIG 70 08/13/2015 1303   HDL 87 08/13/2015 1303   CHOLHDL 2.3 08/13/2015 1303   VLDL 14 08/13/2015 1303   LDLCALC 96 08/13/2015 1303     Lab Results  Component Value Date   HGBA1C 5.6 08/13/2015   HGBA1C 5.7 (H) 05/09/2015   HGBA1C 5.7 (H) 02/07/2015        HPI   82 y.o.WF PMHx CVA, Syncope and Collapse, A-Fib,, HTN, first-degree AV block, Hypothyroidism, Prediabetes Presenting with worsening leg condition. Thursday of last week, she was looking in the freezer and a pack of meat fell down and hit her on the leg (she still cooks all of her meals). Grandson brought her to the ER and she was sent home  following negative xrays. Sent to PCP Monday and had drainage of hematoma was started on antibiotics (Doxycycline) - extremely painful but no obvious infection, just trying to head off infection at that time per family report. Unable to get into wound care center - has appt at [facility] next Tuesday. It has been getting worse- surrounding erythema is growing. No fevers. Never been a big eater but eating as well as usual.  Had a fall about 6 weeks ago. Had pain and was increased from Tramadol to hydrocodone and it created constipation and was hospitalized 8/11-13. Was given Augmentin for lung opacities, had severe diarrhea. Presented with cellulitis/hematoma of the leg  HOSPITAL COURSE:   Hematoma and surrounding cellulitis  Patient had left tibia/fibula x-ray on 8/24, white blood cell count has improved -Continue Clindamycin: Change to PO on 9/5. Blood culture from 9/1 no growth so far -Unable to obtain appointment with outpatient wound care until September 26 in [place]. On Tuesday will need to either obtain earlier appointment or home health/RN. -Acidophilus daily WOC feels this is beyond the scope of treatment CT of the leg shows complex fluid collection measuring 7.7 x 3.8 cm No significant cellulitis  Seen by Dr. Sharol Given, recommended  start with daily cleansing with Silvadene dressing changes daily  Follow-up with Dr. Sharol Given in one week    AKI/Hyponatremia(Baseline Cr 1-1.17) -Persistent hyponatremia since 01/04/16, slightly improved -Currently, appears to have prerenal azotemia which is also improving     -HTN -Lisinopril 5 mg daily    Hypothyroidism -Continue Synthroid at home dose -Normal TSH on 01/05/16  Atrial fibrillation-Chad vasc 2 score of at least , not on anticoagulation to fall risk  History of CVA-on aspirin  Gastroesophageal reflux disease-start PPI  Hypothyroidism-continue Synthroid, TSH 2.4 on 01/05/16     Discharge Exam:   Blood  pressure 126/64, pulse 71, temperature 98 F (36.7 C), temperature source Oral, resp. rate 16, SpO2 97 %.      Follow-up Information    Newt Minion, MD Follow up in 1 week(s).   Specialty:  Orthopedic Surgery Contact information: [place] [address] [phone]        [place] .   Why:  Someone from [facility] will contact you to arrange start date and time for therapy. Contact information: [place] [address] [phone]           Signed: Reyne Dumas 01/29/2016, 2:18 PM        Time spent >45 mins

## 2016-01-29 NOTE — Care Management Note (Signed)
Case Management Note  Patient Details  Name: [name] MRN: [id number] Date of Birth: 08/25/1914  Subjective/Objective:  82 yr old female  In for observation of a left thigh hematoma.                Action/Plan: Case manager spoke with patient and her daughter concerning home health needs. Patient will need Home Health RN for dressing changes. Choice was offered for Home Health Agency. [facility]. Patient's daughter states she has family support for care.   Expected Discharge Date:    01/30/16              Expected Discharge Plan:  Home w Home Health Services  In-House Referral:     Discharge planning Services  CM Consult  Post Acute Care Choice:  Home Health Choice offered to:  Adult Children, Patient  DME Arranged:    DME Agency:  NA  HH Arranged:  RN, PT HH Agency:  [facility]  Status of Service:  Completed, signed off  If discussed at Long Length of Stay Meetings, dates discussed:    Additional Comments:  Durenda GuthrieBrady, Woodard Perrell Naomi, RN 01/29/2016, 11:49 AM

## 2016-01-29 NOTE — Progress Notes (Signed)
Cecilie LowersAlma M Poehler to be D/C'd Home per MD order.  Discussed with the patient and all questions fully answered.  VSS, Skin clean, dry and intact without evidence of skin break down, no evidence of skin tears noted. IV catheter discontinued intact. Site without signs and symptoms of complications. Dressing and pressure applied.  An After Visit Summary was printed and given to the patient. Patient received prescription.  D/c education completed with patient/family including follow up instructions, medication list, d/c activities limitations if indicated, with other d/c instructions as indicated by MD - patient able to verbalize understanding, all questions fully answered.   Patient instructed to return to ED, call 911, or call MD for any changes in condition.   Patient escorted via WC, and D/C home via private auto.  Milas HockShatara Lashena Signer 01/29/2016 5:35 PM

## 2016-01-29 NOTE — Consult Note (Signed)
ORTHOPAEDIC CONSULTATION  REQUESTING PHYSICIAN: Richarda OverlieNayana Abrol, MD  Chief Complaint: Large necrotic hematoma left leg  HPI: [name] is a 82 y.o. female who presents with a large necrotic hematoma left leg patient dropped a large object on her legs 2 weeks ago. Patient has had progressive necrosis with a black eschar over the hematoma.  Past Medical History:  Diagnosis Date  . Atrial fibrillation (HCC)    not a candidate for coumadin due to falls  . AV block, 1st degree   . CVD (cerebrovascular disease)    History of CVA  . GERD (gastroesophageal reflux disease)   . History of echocardiogram 9/11   normal EF, mild to mod MR & TR, mod pulmonary HTN  . Hypertension   . Hypothyroidism   . Osteoarthritis   . Pneumonia 09/11  . Prediabetes   . Syncope and collapse    Negative loop recorder in the past  . Traumatic enucleation of eye    left eye   Past Surgical History:  Procedure Laterality Date  . BACK SURGERY    . INGUINAL HERNIA REPAIR     left sided   Social History   Social History  . Marital status: Single    Spouse name: N/A  . Number of children: N/A  . Years of education: N/A   Social History Main Topics  . Smoking status: Never Smoker  . Smokeless tobacco: Never Used  . Alcohol use No  . Drug use: No  . Sexual activity: No   Other Topics Concern  . None   Social History Narrative  . None   Family History  Problem Relation Age of Onset  . Heart attack Father   . Cancer Brother   . Stroke Brother   . Heart attack Brother    - negative except otherwise stated in the family history section Allergies  Allergen Reactions  . Augmentin [Amoxicillin-Pot Clavulanate] Diarrhea  . Ciprocin-Fluocin-Procin [Fluocinolone Acetonide] Nausea And Vomiting  . Ciprofloxacin Hcl Nausea And Vomiting  . Codeine Hives and Nausea And Vomiting  . Sulfa Drugs Cross Reactors Hives and Nausea And Vomiting   Prior to Admission medications   Medication Sig  Start Date End Date Taking? Authorizing Provider  acetaminophen (TYLENOL) 325 MG tablet Take 650 mg by mouth every 6 (six) hours as needed for mild pain (when Tramadol is taken).    Yes Historical Provider, MD  aspirin 81 MG tablet Take 81 mg by mouth at bedtime.    Yes Historical Provider, MD  beta carotene w/minerals (OCUVITE) tablet Take 1 tablet by mouth 2 (two) times daily.   Yes Historical Provider, MD  Cholecalciferol (VITAMIN D3) 1000 UNITS CAPS Take 1,000 Units by mouth 2 (two) times daily.    Yes Historical Provider, MD  docusate sodium (COLACE) 100 MG capsule Take 100 mg by mouth 2 (two) times daily as needed for mild constipation.  01/06/16  Yes Historical Provider, MD  DULoxetine (CYMBALTA) 60 MG capsule Take 1 capsule (60 mg total) by mouth daily. 12/21/15  Yes Lucky CowboyWilliam McKeown, MD  lisinopril (PRINIVIL,ZESTRIL) 5 MG tablet Take 1 tablet (5 mg total) by mouth daily. 09/12/15  Yes Rosalio MacadamiaLori C Gerhardt, NP  ondansetron (ZOFRAN) 4 MG tablet Take 1 tablet (4 mg total) by mouth every 6 (six) hours. Patient taking differently: Take 4 mg by mouth every 6 (six) hours as needed for nausea.  06/28/14  Yes Tilden FossaElizabeth Rees, MD  pregabalin (LYRICA) 50 MG capsule Take 1 capsule (50  mg total) by mouth at bedtime. 09/27/15  Yes Courtney Forcucci, PA-C  SYNTHROID 75 MCG tablet TAKE 1 TABLET DAILY BEFORE BREAKFAST Patient taking differently: TAKE 1 TABLET (75 mcg) DAILY BEFORE BREAKFAST 10/09/15  Yes Quentin Mulling, PA-C  traMADol (ULTRAM) 50 MG tablet TAKE 1 TABLET BY MOUTH 3 TO 4 TIMES A DAY AS NEEDED FOR PAIN Patient taking differently: TAKE 1 TABLET (50 mg) BY MOUTH 3 TO 4 TIMES A DAY AS NEEDED FOR PAIN (takes with Tylenol 650 mg) 10/08/15  Yes Quentin Mulling, PA-C   Ct Tibia Fibula Left W Contrast  Result Date: 01/28/2016 CLINICAL DATA:  Lower leg contusion 1 week ago with hematoma involving the right lower extremity. The hematoma is draining and there is surrounding cellulitis. EXAM: CT OF THE LEFT TIBIA AND  FIBULA WITH CONTRAST TECHNIQUE: Standard CT imaging of the left lower extremity was performed with coronal and sagittal reformatted images. CONTRAST:  75mL ISOVUE-300 IOPAMIDOL (ISOVUE-300) INJECTION 61% COMPARISON:  Radiographs 01/17/2016 FINDINGS: There is a the cutaneous lesion which demonstrates high attenuation consistent with hematoma. It has the appearance of a large blister on the scan. No subcutaneous hematoma. Mild surrounding subcutaneous edema or inflammation below the lesion which could suggest cellulitis. No drainable soft tissue abscess. No findings for myofasciitis or pyomyositis. The knee and ankle joints are maintained. No findings to suggest septic arthritis. No destructive bony changes to suggest osteomyelitis. Extensive vascular calcifications are noted. IMPRESSION: Cutaneous complex fluid collection has the appearance of a large blister. It measures 7.7 x 3.8 x 1.0 cm. No significant surrounding cellulitis or subcutaneous abscess. No findings for myofasciitis or pyomyositis. No findings for septic arthritis or osteomyelitis. Electronically Signed   By: Rudie Meyer M.D.   On: 01/28/2016 16:14   - pertinent xrays, CT, MRI studies were reviewed and independently interpreted  Positive ROS: All other systems have been reviewed and were otherwise negative with the exception of those mentioned in the HPI and as above.  Physical Exam: General: Alert, no acute distress Psychiatric: Patient is competent for consent with normal mood and affect Lymphatic: No axillary or cervical lymphadenopathy Cardiovascular: No pedal edema Respiratory: No cyanosis, no use of accessory musculature GI: No organomegaly, abdomen is soft and non-tender  Skin: Patient has a wound 9 x 5 cm with a hard lactic eschar there is no cellulitis no drainage no odor no signs of infection. Patient does have venous insufficiency brawny skin color changes in her leg.   Neurologic: Patient does have protective sensation  bilateral lower extremities.   MUSCULOSKELETAL:  On examination patient has a large black eschar over the anterior aspect left leg. After informed consent the eschar which was 9 x 5 cm and hematoma was debrided of skin and soft tissue and hematoma there was healthy viable granulation tissue at the base of the wound.  Assessment: Assessment: Necrotic hematoma left leg.  Plan: Plan: We will plan to start with daily  cleansing with Silvadene dressing changes daily I will follow-up in the office in 1 week.  Thank you for the consult and the opportunity to see Ms. Murlean Iba, MD [facility] [phone] 7:19 AM

## 2016-01-30 DIAGNOSIS — K219 Gastro-esophageal reflux disease without esophagitis: Secondary | ICD-10-CM | POA: Diagnosis not present

## 2016-01-30 DIAGNOSIS — Z7982 Long term (current) use of aspirin: Secondary | ICD-10-CM | POA: Diagnosis not present

## 2016-01-30 DIAGNOSIS — Z9181 History of falling: Secondary | ICD-10-CM | POA: Diagnosis not present

## 2016-01-30 DIAGNOSIS — E039 Hypothyroidism, unspecified: Secondary | ICD-10-CM | POA: Diagnosis not present

## 2016-01-30 DIAGNOSIS — Z8673 Personal history of transient ischemic attack (TIA), and cerebral infarction without residual deficits: Secondary | ICD-10-CM | POA: Diagnosis not present

## 2016-01-30 DIAGNOSIS — I4891 Unspecified atrial fibrillation: Secondary | ICD-10-CM | POA: Diagnosis not present

## 2016-01-30 DIAGNOSIS — M150 Primary generalized (osteo)arthritis: Secondary | ICD-10-CM | POA: Diagnosis not present

## 2016-01-30 DIAGNOSIS — L03116 Cellulitis of left lower limb: Secondary | ICD-10-CM | POA: Diagnosis not present

## 2016-01-30 DIAGNOSIS — N179 Acute kidney failure, unspecified: Secondary | ICD-10-CM | POA: Diagnosis not present

## 2016-01-30 DIAGNOSIS — S8992XD Unspecified injury of left lower leg, subsequent encounter: Secondary | ICD-10-CM | POA: Diagnosis not present

## 2016-01-30 DIAGNOSIS — I10 Essential (primary) hypertension: Secondary | ICD-10-CM | POA: Diagnosis not present

## 2016-01-30 DIAGNOSIS — R7303 Prediabetes: Secondary | ICD-10-CM | POA: Diagnosis not present

## 2016-01-30 LAB — CULTURE, BLOOD (ROUTINE X 2): Culture: NO GROWTH

## 2016-01-31 DIAGNOSIS — S8992XD Unspecified injury of left lower leg, subsequent encounter: Secondary | ICD-10-CM | POA: Diagnosis not present

## 2016-02-01 ENCOUNTER — Other Ambulatory Visit: Payer: Self-pay | Admitting: *Deleted

## 2016-02-01 DIAGNOSIS — Z9181 History of falling: Secondary | ICD-10-CM | POA: Diagnosis not present

## 2016-02-01 DIAGNOSIS — L03116 Cellulitis of left lower limb: Secondary | ICD-10-CM | POA: Diagnosis not present

## 2016-02-01 DIAGNOSIS — E039 Hypothyroidism, unspecified: Secondary | ICD-10-CM | POA: Diagnosis not present

## 2016-02-01 DIAGNOSIS — I10 Essential (primary) hypertension: Secondary | ICD-10-CM | POA: Diagnosis not present

## 2016-02-01 DIAGNOSIS — N179 Acute kidney failure, unspecified: Secondary | ICD-10-CM | POA: Diagnosis not present

## 2016-02-01 DIAGNOSIS — S8992XD Unspecified injury of left lower leg, subsequent encounter: Secondary | ICD-10-CM | POA: Diagnosis not present

## 2016-02-01 DIAGNOSIS — K219 Gastro-esophageal reflux disease without esophagitis: Secondary | ICD-10-CM | POA: Diagnosis not present

## 2016-02-01 DIAGNOSIS — R7303 Prediabetes: Secondary | ICD-10-CM | POA: Diagnosis not present

## 2016-02-01 DIAGNOSIS — M150 Primary generalized (osteo)arthritis: Secondary | ICD-10-CM | POA: Diagnosis not present

## 2016-02-01 DIAGNOSIS — Z8673 Personal history of transient ischemic attack (TIA), and cerebral infarction without residual deficits: Secondary | ICD-10-CM | POA: Diagnosis not present

## 2016-02-01 DIAGNOSIS — I4891 Unspecified atrial fibrillation: Secondary | ICD-10-CM | POA: Diagnosis not present

## 2016-02-01 DIAGNOSIS — Z7982 Long term (current) use of aspirin: Secondary | ICD-10-CM | POA: Diagnosis not present

## 2016-02-01 MED ORDER — GABAPENTIN 100 MG PO CAPS: 100.0000 mg | ORAL_CAPSULE | Freq: Two times a day (BID) | ORAL | 2 refills | Status: DC

## 2016-02-04 ENCOUNTER — Other Ambulatory Visit: Payer: Self-pay | Admitting: *Deleted

## 2016-02-04 DIAGNOSIS — L97221 Non-pressure chronic ulcer of left calf limited to breakdown of skin: Secondary | ICD-10-CM | POA: Diagnosis not present

## 2016-02-04 DIAGNOSIS — I872 Venous insufficiency (chronic) (peripheral): Secondary | ICD-10-CM | POA: Diagnosis not present

## 2016-02-04 MED ORDER — DULOXETINE HCL 60 MG PO CPEP: 60.0000 mg | ORAL_CAPSULE | Freq: Every day | ORAL | 11 refills | Status: DC

## 2016-02-05 DIAGNOSIS — Z7982 Long term (current) use of aspirin: Secondary | ICD-10-CM | POA: Diagnosis not present

## 2016-02-05 DIAGNOSIS — Z9181 History of falling: Secondary | ICD-10-CM | POA: Diagnosis not present

## 2016-02-05 DIAGNOSIS — I4891 Unspecified atrial fibrillation: Secondary | ICD-10-CM | POA: Diagnosis not present

## 2016-02-05 DIAGNOSIS — S8992XD Unspecified injury of left lower leg, subsequent encounter: Secondary | ICD-10-CM | POA: Diagnosis not present

## 2016-02-05 DIAGNOSIS — E039 Hypothyroidism, unspecified: Secondary | ICD-10-CM | POA: Diagnosis not present

## 2016-02-05 DIAGNOSIS — K219 Gastro-esophageal reflux disease without esophagitis: Secondary | ICD-10-CM | POA: Diagnosis not present

## 2016-02-05 DIAGNOSIS — M150 Primary generalized (osteo)arthritis: Secondary | ICD-10-CM | POA: Diagnosis not present

## 2016-02-05 DIAGNOSIS — I10 Essential (primary) hypertension: Secondary | ICD-10-CM | POA: Diagnosis not present

## 2016-02-05 DIAGNOSIS — Z8673 Personal history of transient ischemic attack (TIA), and cerebral infarction without residual deficits: Secondary | ICD-10-CM | POA: Diagnosis not present

## 2016-02-05 DIAGNOSIS — N179 Acute kidney failure, unspecified: Secondary | ICD-10-CM | POA: Diagnosis not present

## 2016-02-05 DIAGNOSIS — L03116 Cellulitis of left lower limb: Secondary | ICD-10-CM | POA: Diagnosis not present

## 2016-02-05 DIAGNOSIS — R7303 Prediabetes: Secondary | ICD-10-CM | POA: Diagnosis not present

## 2016-02-06 ENCOUNTER — Encounter: Payer: Self-pay | Admitting: Internal Medicine

## 2016-02-06 ENCOUNTER — Ambulatory Visit (INDEPENDENT_AMBULATORY_CARE_PROVIDER_SITE_OTHER): Payer: Medicare Other | Admitting: Internal Medicine

## 2016-02-06 ENCOUNTER — Other Ambulatory Visit: Payer: Self-pay | Admitting: *Deleted

## 2016-02-06 ENCOUNTER — Other Ambulatory Visit: Payer: Self-pay | Admitting: Cardiovascular Disease

## 2016-02-06 VITALS — BP 144/64 | HR 64 | Temp 97.3°F | Resp 6 | Wt 116.4 lb

## 2016-02-06 DIAGNOSIS — E781 Pure hyperglyceridemia: Secondary | ICD-10-CM | POA: Diagnosis not present

## 2016-02-06 DIAGNOSIS — D539 Nutritional anemia, unspecified: Secondary | ICD-10-CM

## 2016-02-06 DIAGNOSIS — S8012XA Contusion of left lower leg, initial encounter: Secondary | ICD-10-CM

## 2016-02-06 DIAGNOSIS — E871 Hypo-osmolality and hyponatremia: Secondary | ICD-10-CM

## 2016-02-06 DIAGNOSIS — I10 Essential (primary) hypertension: Secondary | ICD-10-CM

## 2016-02-06 DIAGNOSIS — Z23 Encounter for immunization: Secondary | ICD-10-CM

## 2016-02-06 DIAGNOSIS — E039 Hypothyroidism, unspecified: Secondary | ICD-10-CM

## 2016-02-06 DIAGNOSIS — E559 Vitamin D deficiency, unspecified: Secondary | ICD-10-CM | POA: Diagnosis not present

## 2016-02-06 DIAGNOSIS — Z79899 Other long term (current) drug therapy: Secondary | ICD-10-CM | POA: Diagnosis not present

## 2016-02-06 LAB — BASIC METABOLIC PANEL WITH GFR
BUN: 21 mg/dL (ref 7–25)
CHLORIDE: 101 mmol/L (ref 98–110)
CO2: 28 mmol/L (ref 20–31)
CREATININE: 0.99 mg/dL — AB (ref 0.60–0.88)
Calcium: 9.1 mg/dL (ref 8.6–10.4)
GFR, Est African American: 55 mL/min — ABNORMAL LOW (ref >=60)
GFR, Est Non African American: 48 mL/min — ABNORMAL LOW (ref >=60)
GLUCOSE: 91 mg/dL (ref 65–99)
Potassium: 4.9 mmol/L (ref 3.5–5.3)
Sodium: 136 mmol/L (ref 135–146)

## 2016-02-06 LAB — CBC WITH DIFFERENTIAL/PLATELET
BASOS ABS: 90 {cells}/uL (ref 0–200)
Basophils Relative: 1 %
EOS ABS: 900 {cells}/uL — AB (ref 15–500)
EOS PCT: 10 %
HCT: 31.3 % — ABNORMAL LOW (ref 35.0–45.0)
HEMOGLOBIN: 10.4 g/dL — AB (ref 11.7–15.5)
Lymphocytes Relative: 21 %
Lymphs Abs: 1890 cells/uL (ref 850–3900)
MCH: 31 pg (ref 27.0–33.0)
MCHC: 33.2 g/dL (ref 32.0–36.0)
MCV: 93.2 fL (ref 80.0–100.0)
MONOS PCT: 20 %
MPV: 8.8 fL (ref 7.5–12.5)
Monocytes Absolute: 1800 cells/uL — ABNORMAL HIGH (ref 200–950)
NEUTROS PCT: 48 %
Neutro Abs: 4320 cells/uL (ref 1500–7800)
PLATELETS: 355 10*3/uL (ref 140–400)
RBC: 3.36 MIL/uL — ABNORMAL LOW (ref 3.80–5.10)
RDW: 13.6 % (ref 11.0–15.0)
WBC: 9 10*3/uL (ref 3.8–10.8)

## 2016-02-06 LAB — TSH: TSH: 2.66 m[IU]/L

## 2016-02-06 LAB — MAGNESIUM: MAGNESIUM: 2.1 mg/dL (ref 1.5–2.5)

## 2016-02-06 MED ORDER — DULOXETINE HCL 60 MG PO CPEP: 60.0000 mg | ORAL_CAPSULE | Freq: Every day | ORAL | 11 refills | Status: AC

## 2016-02-06 NOTE — Progress Notes (Signed)
[facility]                       Lucky Cowboy, M.D.        Dyanne Carrel. Steffanie Dunn, P.A.-C       Terri Piedra, P.A.-C  [facility]                [address]                [place], [place]. [address] Telephone [phone] Telefax [phone] _____________________________________________________________________     This very nice 82 y.o. [facility] presents for hospital of cellulitis, hyponatremia and anemia.  Patient was hospitalized Aug 11-13 with suspected PNA & was treated with Augmentin.  More recently patient was seen in ythe offive on 24 Aug for a large hematoma of the L ant shin and was prescribed Doxycyxcline and as she worsened with a progressive cellulitis she was re-hospitalized Sept 1-5 and treated with IV Clindamycin and was seen and by Dr Lajoyce Corners for wound debridement and he continues to follow her as an out-patient.  During hospitalization, patient was found anemic & hyponatremic. Patient is also followed for Hypertension, Hyperlipidemia, Pre-Diabetes and Vitamin D Deficiency.      Patient is treated for HTN  And remote hxc/o Stroke and pAfib in 2004.  More recently, BP has been controlled at home. Today's BP is 144/64. Patient has had no complaints of any cardiac type chest pain, palpitations, dyspnea/orthopnea/PND, dizziness, claudication, or dependent edema.     Hyperlipidemia is controlled with diet. Last Lipids were at goal: Lab Results  Component Value Date   CHOL 197 08/13/2015   HDL 87 08/13/2015   LDLCALC 96 08/13/2015   TRIG 70 08/13/2015   CHOLHDL 2.3 08/13/2015      Also, the patient has history of PreDiabetes with A1c 5.7% in Dec 2016 and has had no symptoms of reactive hypoglycemia, diabetic polys, paresthesias or visual blurring.  Last A1c was at goal:  Lab Results  Component Value Date   HGBA1C 5.6 08/13/2015      Further, the patient also has history of Vitamin D Deficiency and supplements  vitamin D without any suspected side-effects. Last vitamin D was still low:  Lab Results  Component Value Date   VD25OH 46 08/13/2015   Current Outpatient Prescriptions on File Prior to Visit  Medication Sig  . acetaminophen  325 MG tablet Take 650 mg by mouth every 6 (six) hours as needed for mild pain (when Tramadol is taken).   Marland Kitchen aspirin 81 MG tablet Take 81 mg by mouth at bedtime.   Idolina Primer tablet Take 1 tablet by mouth 2 (two) times daily.  Marland Kitchen VITAMIN D 1000 UNITS CAPS Take 1,000 Units by mouth 2 (two) times daily.   Marland Kitchen gabapentin  100 MG capsule Take 1 capsule (100 mg total) by mouth 2 (two) times daily.  Marland Kitchen lisinopril  5 MG tablet Take 1 tablet (5 mg total) by mouth daily.  . ondansetron (ZOFRAN) 4 MG tablet Take 1 tab every 6 hours if needed for Nausea  . SYNTHROID 75 MCG tablet TAKE 1 TAB DAILY BEFORE BREAKFAST   . traMADol (ULTRAM) 50 MG tablet TAKE 1 TAB 3 TO 4 TIMES A DAY AS NEEDED FOR PAIN  . COLACE 100 MG capsule Take 100 mg by mouth 2 (two) times daily as needed   Allergies  Allergen Reactions  . Augmentin [Amoxicillin-Pot Clavulanate] Diarrhea  .  Ciprocin-Fluocin-Procin [Fluocinolone Acetonide] Nausea And Vomiting  . Ciprofloxacin Hcl Nausea And Vomiting  . Codeine Hives and Nausea And Vomiting  . Sulfa Drugs Cross Reactors Hives and Nausea And Vomiting   PMHx:   Past Medical History:  Diagnosis Date  . Atrial fibrillation (HCC)    not a candidate for coumadin due to falls  . AV block, 1st degree   . CVD (cerebrovascular disease)    History of CVA  . GERD (gastroesophageal reflux disease)   . History of echocardiogram 9/11   normal EF, mild to mod MR & TR, mod pulmonary HTN  . Hypertension   . Hypothyroidism   . Osteoarthritis   . Pneumonia 09/11  . Prediabetes   . Syncope and collapse    Negative loop recorder in the past  . Traumatic enucleation of eye    left eye   Immunization History  Administered Date(s) Administered  . Influenza Split 02/03/2013   . Influenza, High Dose Seasonal PF 02/22/2014, 02/07/2015, 02/06/2016  . Pneumococcal Conjugate-13 04/11/2014  . Pneumococcal-Unspecified 05/26/2009  . Td 03/23/2013  . Zoster 05/26/2005   Past Surgical History:  Procedure Laterality Date  . BACK SURGERY    . INGUINAL HERNIA REPAIR     left sided   FHx:    Reviewed / unchanged  SHx:    Reviewed / unchanged  Systems Review:  Constitutional: Denies fever, chills, wt changes, headaches, insomnia, fatigue, night sweats, change in appetite. Eyes: Denies redness, blurred vision, diplopia, discharge, itchy, watery eyes.  ENT: Denies discharge, congestion, post nasal drip, epistaxis, sore throat, earache, hearing loss, dental pain, tinnitus, vertigo, sinus pain, snoring.  CV: Denies chest pain, palpitations, irregular heartbeat, syncope, dyspnea, diaphoresis, orthopnea, PND, claudication or edema. Respiratory: denies cough, dyspnea, DOE, pleurisy, hoarseness, laryngitis, wheezing.  Gastrointestinal: Denies dysphagia, odynophagia, heartburn, reflux, water brash, abdominal pain or cramps, nausea, vomiting, bloating, diarrhea, constipation, hematemesis, melena, hematochezia  or hemorrhoids. Genitourinary: Denies dysuria, frequency, urgency, nocturia, hesitancy, discharge, hematuria or flank pain. Musculoskeletal: Denies arthralgias, myalgias, stiffness, jt. swelling, pain, limping or strain/sprain.  Skin: Denies pruritus, rash, hives, warts, acne, eczema or change in skin lesion(s). Neuro: No weakness, tremor, incoordination, spasms, paresthesia or pain. Psychiatric: Denies confusion, memory loss or sensory loss. Endo: Denies change in weight, skin or hair change.  Heme/Lymph: No excessive bleeding, bruising or enlarged lymph nodes.  Physical Exam BP (!) 144/64   Pulse 64   Temp 97.3 F (36.3 C)   Resp (!) 6   Ht (P) 5\' 2"  (1.575 m)   Wt 116 lb 6.4 oz (52.8 kg)   BMI (P) 21.29 kg/m   Appears chronically ill nd in no  distress.  Eyes: Left Eye Prosthesis. Sinuses: No frontal/maxillary tenderness ENT/Mouth: EAC's clear, TM's nl w/o erythema, bulging. Nares clear w/o erythema, swelling, exudates. Oropharynx clear without erythema or exudates. Oral hygiene is good. Tongue normal, non obstructing. Hearing intact.  Neck: Supple. Thyroid nl. Car 2+/2+ without bruits, nodes or JVD. Chest: Respirations nl with BS clear & equal w/o rales, rhonchi, wheezing or stridor.  Cor: Heart sounds normal w/ regular rate and rhythm without sig. murmurs, gallops, clicks, or rubs. Peripheral pulses normal and equal  without edema.  Abdomen: Soft & bowel sounds normal. Non-tender w/o guarding, rebound, hernias, masses, or organomegaly.  Lymphatics: Unremarkable.  Musculoskeletal: Full ROM all peripheral extremities, joint stability, 5/5 strength, and normal gait.  Skin: Anterior mid  Left shin with a full thickness  2" x 3&1/2 " full thickness decubitus appearing  clean.  Neuro: Cranial nerves intact, reflexes equal bilaterally. Sensory-motor testing grossly intact. Tendon reflexes grossly intact.  Pysch: Alert & oriented x 3.  Insight and judgement nl & appropriate. No ideations.  Assessment and Plan:  1. Hematoma of leg, left,   2. Hyponatremia  - BASIC METABOLIC PANEL WITH GFR  3. Essential hypertension  - Continue medication, monitor blood pressure at home. Continue DASH diet. Reminder to go to the ER if any CP, SOB, nausea, dizziness, severe HA, changes vision/speech, left arm numbness and tingling and jaw pain. - TSH  4. Vitamin D deficiency  - Continue supplementation. - VITAMIN D 25 Hydroxy   5. Hypothyroidism  - TSH  6. Deficiency anemia  - CBC with Differential/Platelet  7. Medication management  - CBC with Differential/Platelet - Magnesium - BASIC METABOLIC PANEL WITH GFR  8. Need for prophylactic vaccination and inoculation against influenza  - Flu vaccine HIGH DOSE PF (Fluzone High  dose)   Recommended regular exercise, BP monitoring, weight control, and discussed med and SE's. Recommended labs to assess and monitor clinical status. Further disposition pending results of labs. Over 30 minutes of exam, counseling, chart review was performed

## 2016-02-07 DIAGNOSIS — E039 Hypothyroidism, unspecified: Secondary | ICD-10-CM | POA: Diagnosis not present

## 2016-02-07 DIAGNOSIS — I4891 Unspecified atrial fibrillation: Secondary | ICD-10-CM | POA: Diagnosis not present

## 2016-02-07 DIAGNOSIS — R7303 Prediabetes: Secondary | ICD-10-CM | POA: Diagnosis not present

## 2016-02-07 DIAGNOSIS — S8992XD Unspecified injury of left lower leg, subsequent encounter: Secondary | ICD-10-CM | POA: Diagnosis not present

## 2016-02-07 DIAGNOSIS — Z9181 History of falling: Secondary | ICD-10-CM | POA: Diagnosis not present

## 2016-02-07 DIAGNOSIS — Z7982 Long term (current) use of aspirin: Secondary | ICD-10-CM | POA: Diagnosis not present

## 2016-02-07 DIAGNOSIS — K219 Gastro-esophageal reflux disease without esophagitis: Secondary | ICD-10-CM | POA: Diagnosis not present

## 2016-02-07 DIAGNOSIS — Z8673 Personal history of transient ischemic attack (TIA), and cerebral infarction without residual deficits: Secondary | ICD-10-CM | POA: Diagnosis not present

## 2016-02-07 DIAGNOSIS — I10 Essential (primary) hypertension: Secondary | ICD-10-CM | POA: Diagnosis not present

## 2016-02-07 DIAGNOSIS — M150 Primary generalized (osteo)arthritis: Secondary | ICD-10-CM | POA: Diagnosis not present

## 2016-02-07 DIAGNOSIS — L03116 Cellulitis of left lower limb: Secondary | ICD-10-CM | POA: Diagnosis not present

## 2016-02-07 DIAGNOSIS — N179 Acute kidney failure, unspecified: Secondary | ICD-10-CM | POA: Diagnosis not present

## 2016-02-07 LAB — VITAMIN D 25 HYDROXY (VIT D DEFICIENCY, FRACTURES): Vit D, 25-Hydroxy: 45 ng/mL (ref 30–100)

## 2016-02-08 ENCOUNTER — Other Ambulatory Visit: Payer: Self-pay | Admitting: Cardiovascular Disease

## 2016-02-11 ENCOUNTER — Telehealth: Payer: Self-pay | Admitting: Nurse Practitioner

## 2016-02-11 DIAGNOSIS — I872 Venous insufficiency (chronic) (peripheral): Secondary | ICD-10-CM | POA: Diagnosis not present

## 2016-02-11 DIAGNOSIS — L97221 Non-pressure chronic ulcer of left calf limited to breakdown of skin: Secondary | ICD-10-CM | POA: Diagnosis not present

## 2016-02-11 NOTE — Telephone Encounter (Signed)
Notified the pts Daughter that per Norma FredricksonLori Gerhardt NP, she recommends that when this episode occurs again, she should obtain the pts BS level and obtain her BP and HR, and write this down.  Informed the pts daughter that this helps with giving us information to better care for the pt, and triage appropriately.  Also informed the daughter that per Lawson FiscalLori, if this episode occurs again, she should notify our office, for Lawson FiscalLori will consider putting a 30 day event monitor on the pt, and bringing her in for a sooner follow-up appt.  Daughter verbalized understanding, agrees with this plan, and gracious for all the assistance provided.

## 2016-02-11 NOTE — Telephone Encounter (Signed)
New message       Pt c/o Syncope: STAT if syncope occurred within 30 minutes and pt complains of lightheadedness High Priority if episode of passing out, completely, today or in last 24 hours   Did you pass out today?  no When is the last time you passed out? last Friday and Sunday Has this occurred multiple times?  twice 1. Did you have any symptoms prior to passing out?  No warning prior to passing out----did not lose consciousness  2. Calling to let Lawson FiscalLori know this happened

## 2016-02-11 NOTE — Telephone Encounter (Signed)
Pts daughter is calling, as instructed by Norma FredricksonLori Gerhardt, to inform if the pt has any pre-syncopal or syncopal episodes.  Pts Daughter states that this happened on last Friday and Sunday.  Daughter states that the pt does not lose consciousness when these episodes occur.  Daughter states that its more of a pre-syncopal episode, for the pt comes around quickly.  Daughter states that when this does occur, the pt is quickly able to follow commands appropriately, and is alert and oriented to person, place, time, and event.  Daughter states that when these episodes occur, the pt complains of no chest pain, sob, dizziness, palpitations, slurred speech, headache, or any other cardiac related issues.  Daughter states that the pt states "it happens with no warning."  Asked the pts daughter if her BS could be running low, and if they are checking this, when these episodes occur.  Per the daughter, they have never checked this.  Per the daughter, they do not check any VS when these episodes occur.  Advised the pts daughter that it wouldn't be a bad idea for them to purchase a BP cuff and CBG machine, for monitoring of BP, HR, and BS.  Advised the pts daughter that if this occurs again, she should notify our office during business hours, or call 911 immediately, for this could be an acute issue related to her heart, BS, or a neuro issue. Per the daughter, the pt has been experiencing this for the past 7 months.  Daughter states that Lawson FiscalLori is aware of this and instructed them to call if these episodes occur again.  Daughter states that the pt is in no acute distress at this time or at all today.  Pt is back to her norm.  Informed the pts daughter that I will go and speak with Lawson FiscalLori about this, and follow-up with any new recommendations.  Daughter verbalized understanding and agrees with this plan.

## 2016-02-13 ENCOUNTER — Ambulatory Visit: Payer: Self-pay | Admitting: Internal Medicine

## 2016-02-13 ENCOUNTER — Other Ambulatory Visit: Payer: Self-pay | Admitting: Physician Assistant

## 2016-02-14 NOTE — Telephone Encounter (Signed)
Rx called into [facility]. 

## 2016-02-15 ENCOUNTER — Emergency Department (HOSPITAL_COMMUNITY): Payer: Medicare Other

## 2016-02-15 ENCOUNTER — Observation Stay (HOSPITAL_COMMUNITY)
Admission: EM | Admit: 2016-02-15 | Discharge: 2016-02-19 | Disposition: A | Discharge status: Home / Self Care | Payer: Medicare Other | Attending: Internal Medicine | Admitting: Internal Medicine

## 2016-02-15 ENCOUNTER — Observation Stay (HOSPITAL_COMMUNITY): Payer: Medicare Other

## 2016-02-15 ENCOUNTER — Encounter (HOSPITAL_COMMUNITY): Payer: Self-pay | Admitting: Emergency Medicine

## 2016-02-15 DIAGNOSIS — R079 Chest pain, unspecified: Secondary | ICD-10-CM | POA: Diagnosis not present

## 2016-02-15 DIAGNOSIS — L03116 Cellulitis of left lower limb: Secondary | ICD-10-CM | POA: Diagnosis not present

## 2016-02-15 DIAGNOSIS — I4891 Unspecified atrial fibrillation: Secondary | ICD-10-CM | POA: Diagnosis present

## 2016-02-15 DIAGNOSIS — Z515 Encounter for palliative care: Secondary | ICD-10-CM

## 2016-02-15 DIAGNOSIS — R072 Precordial pain: Principal | ICD-10-CM | POA: Insufficient documentation

## 2016-02-15 DIAGNOSIS — J810 Acute pulmonary edema: Secondary | ICD-10-CM | POA: Diagnosis not present

## 2016-02-15 DIAGNOSIS — I10 Essential (primary) hypertension: Secondary | ICD-10-CM | POA: Diagnosis not present

## 2016-02-15 DIAGNOSIS — K869 Disease of pancreas, unspecified: Secondary | ICD-10-CM

## 2016-02-15 DIAGNOSIS — Z79899 Other long term (current) drug therapy: Secondary | ICD-10-CM | POA: Diagnosis not present

## 2016-02-15 DIAGNOSIS — E871 Hypo-osmolality and hyponatremia: Secondary | ICD-10-CM

## 2016-02-15 DIAGNOSIS — R1013 Epigastric pain: Secondary | ICD-10-CM | POA: Diagnosis not present

## 2016-02-15 DIAGNOSIS — R0602 Shortness of breath: Secondary | ICD-10-CM | POA: Diagnosis not present

## 2016-02-15 DIAGNOSIS — E559 Vitamin D deficiency, unspecified: Secondary | ICD-10-CM | POA: Diagnosis present

## 2016-02-15 DIAGNOSIS — E039 Hypothyroidism, unspecified: Secondary | ICD-10-CM | POA: Diagnosis not present

## 2016-02-15 DIAGNOSIS — Z7189 Other specified counseling: Secondary | ICD-10-CM

## 2016-02-15 DIAGNOSIS — Z7982 Long term (current) use of aspirin: Secondary | ICD-10-CM | POA: Diagnosis not present

## 2016-02-15 DIAGNOSIS — K5730 Diverticulosis of large intestine without perforation or abscess without bleeding: Secondary | ICD-10-CM | POA: Diagnosis not present

## 2016-02-15 DIAGNOSIS — R109 Unspecified abdominal pain: Secondary | ICD-10-CM

## 2016-02-15 DIAGNOSIS — R55 Syncope and collapse: Secondary | ICD-10-CM

## 2016-02-15 DIAGNOSIS — K8689 Other specified diseases of pancreas: Secondary | ICD-10-CM

## 2016-02-15 DIAGNOSIS — R0789 Other chest pain: Secondary | ICD-10-CM | POA: Diagnosis not present

## 2016-02-15 DIAGNOSIS — R05 Cough: Secondary | ICD-10-CM | POA: Diagnosis not present

## 2016-02-15 LAB — BASIC METABOLIC PANEL
ANION GAP: 10 (ref 5–15)
BUN: 12 mg/dL (ref 6–20)
CHLORIDE: 93 mmol/L — AB (ref 101–111)
CO2: 24 mmol/L (ref 22–32)
Calcium: 9.1 mg/dL (ref 8.9–10.3)
Creatinine, Ser: 0.86 mg/dL (ref 0.44–1.00)
GFR calc non Af Amer: 54 mL/min — ABNORMAL LOW (ref >60)
Glucose, Bld: 131 mg/dL — ABNORMAL HIGH (ref 65–99)
POTASSIUM: 4.6 mmol/L (ref 3.5–5.1)
Sodium: 127 mmol/L — ABNORMAL LOW (ref 135–145)

## 2016-02-15 LAB — CBC
HCT: 33 % — ABNORMAL LOW (ref 36.0–46.0)
HEMOGLOBIN: 10.5 g/dL — AB (ref 12.0–15.0)
MCH: 30.3 pg (ref 26.0–34.0)
MCHC: 31.8 g/dL (ref 30.0–36.0)
MCV: 95.4 fL (ref 78.0–100.0)
Platelets: 387 10*3/uL (ref 150–400)
RBC: 3.46 MIL/uL — AB (ref 3.87–5.11)
RDW: 14.5 % (ref 11.5–15.5)
WBC: 10.7 10*3/uL — ABNORMAL HIGH (ref 4.0–10.5)

## 2016-02-15 LAB — LIPASE, BLOOD: LIPASE: 24 U/L (ref 11–51)

## 2016-02-15 LAB — BRAIN NATRIURETIC PEPTIDE: B Natriuretic Peptide: 263.6 pg/mL — ABNORMAL HIGH (ref 0.0–100.0)

## 2016-02-15 LAB — I-STAT TROPONIN, ED
TROPONIN I, POC: 0.02 ng/mL (ref 0.00–0.08)
Troponin i, poc: 0.02 ng/mL (ref 0.00–0.08)

## 2016-02-15 LAB — URINALYSIS, ROUTINE W REFLEX MICROSCOPIC
BILIRUBIN URINE: NEGATIVE
Glucose, UA: NEGATIVE mg/dL
Hgb urine dipstick: NEGATIVE
Ketones, ur: NEGATIVE mg/dL
Leukocytes, UA: NEGATIVE
NITRITE: NEGATIVE
Protein, ur: NEGATIVE mg/dL
SPECIFIC GRAVITY, URINE: 1.007 (ref 1.005–1.030)
pH: 7 (ref 5.0–8.0)

## 2016-02-15 LAB — HEPATIC FUNCTION PANEL
ALT: 12 U/L — AB (ref 14–54)
AST: 23 U/L (ref 15–41)
Albumin: 3.4 g/dL — ABNORMAL LOW (ref 3.5–5.0)
Alkaline Phosphatase: 57 U/L (ref 38–126)
BILIRUBIN INDIRECT: 0.8 mg/dL (ref 0.3–0.9)
Bilirubin, Direct: 0.2 mg/dL (ref 0.1–0.5)
TOTAL PROTEIN: 6.4 g/dL — AB (ref 6.5–8.1)
Total Bilirubin: 1 mg/dL (ref 0.3–1.2)

## 2016-02-15 LAB — TROPONIN I

## 2016-02-15 MED ORDER — LISINOPRIL 5 MG PO TABS
5.0000 mg | ORAL_TABLET | Freq: Every day | ORAL | Status: DC
  Administered 2016-02-16 – 2016-02-19 (×4): 5 mg via ORAL
  Filled 2016-02-15 (×4): qty 1

## 2016-02-15 MED ORDER — ONDANSETRON HCL 4 MG/2ML IJ SOLN: 4.0000 mg | Freq: Four times a day (QID) | INTRAMUSCULAR | Status: DC | PRN

## 2016-02-15 MED ORDER — GI COCKTAIL ~~LOC~~
30.0000 mL | Freq: Once | ORAL | Status: AC
  Administered 2016-02-15: 30 mL via ORAL
  Filled 2016-02-15: qty 30

## 2016-02-15 MED ORDER — ENOXAPARIN SODIUM 30 MG/0.3ML ~~LOC~~ SOLN
30.0000 mg | SUBCUTANEOUS | Status: DC
  Administered 2016-02-15 – 2016-02-18 (×4): 30 mg via SUBCUTANEOUS
  Filled 2016-02-15 (×4): qty 0.3

## 2016-02-15 MED ORDER — SODIUM CHLORIDE 0.9 % IV SOLN
INTRAVENOUS | Status: DC
  Administered 2016-02-16: 02:00:00 via INTRAVENOUS

## 2016-02-15 MED ORDER — ASPIRIN 81 MG PO CHEW
324.0000 mg | CHEWABLE_TABLET | Freq: Once | ORAL | Status: AC
Start: 2016-02-15 — End: 2016-02-15
  Administered 2016-02-15: 324 mg via ORAL
  Filled 2016-02-15: qty 4

## 2016-02-15 MED ORDER — ASPIRIN EC 81 MG PO TBEC
81.0000 mg | DELAYED_RELEASE_TABLET | Freq: Every day | ORAL | Status: DC
  Administered 2016-02-15 – 2016-02-18 (×4): 81 mg via ORAL
  Filled 2016-02-15 (×4): qty 1

## 2016-02-15 MED ORDER — IOPAMIDOL (ISOVUE-300) INJECTION 61%
INTRAVENOUS | Status: AC
  Administered 2016-02-15: 100 mL
  Filled 2016-02-15: qty 100

## 2016-02-15 MED ORDER — DOCUSATE SODIUM 100 MG PO CAPS: 100.0000 mg | ORAL_CAPSULE | Freq: Two times a day (BID) | ORAL | Status: DC | PRN

## 2016-02-15 MED ORDER — ACETAMINOPHEN 325 MG PO TABS
650.0000 mg | ORAL_TABLET | ORAL | Status: DC | PRN
  Administered 2016-02-17: 650 mg via ORAL
  Filled 2016-02-15: qty 2

## 2016-02-15 MED ORDER — DULOXETINE HCL 60 MG PO CPEP
60.0000 mg | ORAL_CAPSULE | Freq: Every day | ORAL | Status: DC
  Administered 2016-02-15 – 2016-02-19 (×5): 60 mg via ORAL
  Filled 2016-02-15 (×5): qty 1

## 2016-02-15 MED ORDER — TRAMADOL HCL 50 MG PO TABS
50.0000 mg | ORAL_TABLET | Freq: Four times a day (QID) | ORAL | Status: DC | PRN
  Administered 2016-02-15 – 2016-02-18 (×3): 50 mg via ORAL
  Filled 2016-02-15 (×3): qty 1

## 2016-02-15 MED ORDER — GABAPENTIN 100 MG PO CAPS
100.0000 mg | ORAL_CAPSULE | Freq: Two times a day (BID) | ORAL | Status: DC
  Administered 2016-02-15 – 2016-02-19 (×8): 100 mg via ORAL
  Filled 2016-02-15 (×8): qty 1

## 2016-02-15 MED ORDER — FAMOTIDINE 20 MG PO TABS
20.0000 mg | ORAL_TABLET | ORAL | Status: DC
  Administered 2016-02-15 – 2016-02-18 (×4): 20 mg via ORAL
  Filled 2016-02-15 (×4): qty 1

## 2016-02-15 MED ORDER — LEVOTHYROXINE SODIUM 75 MCG PO TABS
75.0000 ug | ORAL_TABLET | Freq: Every day | ORAL | Status: DC
  Administered 2016-02-16 – 2016-02-19 (×4): 75 ug via ORAL
  Filled 2016-02-15 (×4): qty 1

## 2016-02-15 NOTE — ED Triage Notes (Signed)
Pt arrives via POv from home with chest pains for the last 2 days. Pts family also report pt with SOB on exertion, increased fatigue. Several falls over the last week without warning. Pt awake, alert, oriented x4. No acute distress noted.

## 2016-02-15 NOTE — H&P (Signed)
History and Physical    [name] UJW:[id number] DOB: 1915/03/28 DOA: 02/15/2016  PCP: Nadean Corwin, MD   Patient coming from: Home   Chief Complaint: Epigastric pain  HPI: [name] is a 82 y.o. woman with a history of TIA/CVA, chronic atrial fibrillation (CHADS-Vasc score of at least 4, no long term anticoagulation due to fall risk), HTN, hypothyroidism, and vitamin D deficiency who presents with her daughter [name] (who is also her POA) for evaluation of epigastric pain, acute onset after having a choking episode three days ago.  She clearly localizes the pain to her epigastric area, with radiation to her back and into her chest.  She also reports associated shortness of breath.  No nausea or vomiting.  She has a dry cough.  No fever.  She has had recurrent falls as well.  These are usually without warning and have never resulted in LOC or confusion.  She denies headache or dizziness.  She has had vague vision disturbance, not particularly related to her presenting symptoms.    She was admitted to this hospital 9/1 - 9/5 for management of LLE cellulitis with associated hematoma.  She is being followed by Dr. Lajoyce Corners for wound care.  ED Course: EKG shows rate controlled atrial fibrillation without acute ST changes.  Chest xray suggests patchy interstitial edema at the lung bases with cardiomegaly.  No airspace consolidation.  Apparent thoracic compression fracture.  BNP equivocal at 263.  Normal lipase.  Negative troponin.  Unremarkable LFTs.  WBC mildly elevated at 10.7.  Hgb stable at 10.5.  Sodium 127.  Chloride 93.  Normal BUN and creatinine. U/A unremarkable.  Of note, review of prior imaging shows that the patient has a history of abnormality involving the pancreatic tale.  Findings were stable from 2014 to 2015 (though the CT images in 2015 were without contrast).  It does not appear that she has had abdominal CT since then.  Hospitalist to admit for further  evaluation.  Review of Systems: As per HPI otherwise 10 point review of systems negative.    Past Medical History:  Diagnosis Date  . Atrial fibrillation (HCC)    not a candidate for coumadin due to falls  . AV block, 1st degree   . CVD (cerebrovascular disease)    History of CVA  . GERD (gastroesophageal reflux disease)   . History of echocardiogram 9/11   normal EF, mild to mod MR & TR, mod pulmonary HTN  . Hypertension   . Hypothyroidism   . Osteoarthritis   . Pneumonia 09/11  . Prediabetes   . Syncope and collapse    Negative loop recorder in the past  . Traumatic enucleation of eye    left eye    Past Surgical History:  Procedure Laterality Date  . BACK SURGERY    . INGUINAL HERNIA REPAIR     left sided     reports that she has never smoked. She has never used smokeless tobacco. She reports that she does not drink alcohol or use drugs. She lived with her only son until he died in 05-26-23.  Her daughter and grandson moved in with her after that so that she could remain in familiar surroundings.  She has been cooking less, but she still ambulates independently.  Allergies  Allergen Reactions  . Augmentin [Amoxicillin-Pot Clavulanate] Diarrhea  . Ciprocin-Fluocin-Procin [Fluocinolone Acetonide] Nausea And Vomiting  . Ciprofloxacin Hcl Nausea And Vomiting  . Codeine Hives and Nausea And Vomiting  . Sulfa  Drugs Cross Reactors Hives and Nausea And Vomiting    Family History  Problem Relation Age of Onset  . Heart attack Father   . Cancer Brother   . Stroke Brother   . Heart attack Brother      Prior to Admission medications   Medication Sig Start Date End Date Taking? Authorizing Provider  acetaminophen (TYLENOL) 325 MG tablet Take 325-650 mg by mouth See admin instructions. 325 mg when Tramadol is taken; 650 mg as needed for headache or pain and NO Tramadol   Yes Historical Provider, MD  aspirin 81 MG tablet Take 81 mg by mouth at bedtime.    Yes Historical  Provider, MD  beta carotene w/minerals (OCUVITE) tablet Take 1 tablet by mouth 2 (two) times daily.   Yes Historical Provider, MD  Cholecalciferol (VITAMIN D3) 1000 UNITS CAPS Take 1,000 Units by mouth 2 (two) times daily.    Yes Historical Provider, MD  docusate sodium (COLACE) 100 MG capsule Take 100 mg by mouth 2 (two) times daily as needed for mild constipation.  01/06/16  Yes Historical Provider, MD  DULoxetine (CYMBALTA) 60 MG capsule Take 1 capsule (60 mg total) by mouth daily. 02/06/16  Yes Lucky CowboyWilliam McKeown, MD  gabapentin (NEURONTIN) 100 MG capsule Take 1 capsule (100 mg total) by mouth 2 (two) times daily. 02/01/16  Yes Courtney Forcucci, PA-C  lisinopril (PRINIVIL,ZESTRIL) 5 MG tablet Take 1 tablet (5 mg total) by mouth daily. 09/12/15  Yes Rosalio MacadamiaLori C Gerhardt, NP  MAGNESIUM PO Take 1 tablet by mouth daily.   Yes Historical Provider, MD  ondansetron (ZOFRAN) 4 MG tablet Take 1 tablet (4 mg total) by mouth every 6 (six) hours. Patient taking differently: Take 4 mg by mouth every 6 (six) hours as needed for nausea.  06/28/14  Yes Tilden FossaElizabeth Rees, MD  SYNTHROID 75 MCG tablet TAKE 1 TABLET DAILY BEFORE BREAKFAST Patient taking differently: TAKE 1 TABLET (75 mcg) DAILY BEFORE BREAKFAST 10/09/15  Yes Quentin MullingAmanda Collier, PA-C  traMADol (ULTRAM) 50 MG tablet TAKE 1 TABLET BY MOUTH 3-4 TIMES PER DAY AS NEEDED FOR PAIN Patient taking differently: TAKE 1 TABLET (50 mg) BY MOUTH twice daily AS NEEDED FOR PAIN 02/14/16  Yes Quentin MullingAmanda Collier, PA-C    Physical Exam: Vitals:   02/15/16 1700 02/15/16 1730 02/15/16 1845 02/15/16 1927  BP: 171/77 170/68 161/76 177/82  Pulse: 74 73 76 86  Resp: 20 15 19 25   Temp:      TempSrc:      SpO2: 95% 100% 96% 99%      Constitutional: NAD, calm, comfortable, hard of hearing Vitals:   02/15/16 1700 02/15/16 1730 02/15/16 1845 02/15/16 1927  BP: 171/77 170/68 161/76 177/82  Pulse: 74 73 76 86  Resp: 20 15 19 25   Temp:      TempSrc:      SpO2: 95% 100% 96% 99%   Eyes:  PERRL, lids and conjunctivae normal ENMT: Mucous membranes are DRY. Posterior pharynx clear of any exudate or lesions. Normal dentition.  Neck: normal appearance, supple, no masses Respiratory: clear to auscultation bilaterally, no wheezing, no crackles. Normal respiratory effort. No accessory muscle use.  Cardiovascular: Irregular but rate controlled.  No murmurs / rubs / gallops. No extremity edema. 2+ pedal pulses. No carotid bruits.  GI: abdomen is soft and compressible.  No distention.  TTP in the epigastric area.  Bowel sounds are present. Musculoskeletal:  No joint deformity in upper and lower extremities. Good ROM, no contractures. Normal muscle tone.  Skin: 5 cm ulcer to left shin with yellow drainage, mild surrounding erythema but no significant tenderness, no odor, overall improved compared to prior pictures Neurologic: CN 2-12 grossly intact. Sensation intact, Strength symmetric bilaterally, 5/5  Psychiatric: Normal judgment and insight. Alert and oriented x 3. Normal mood.     Labs on Admission: I have personally reviewed following labs and imaging studies  CBC:  Recent Labs Lab 02/15/16 1445  WBC 10.7*  HGB 10.5*  HCT 33.0*  MCV 95.4  PLT 387   Basic Metabolic Panel:  Recent Labs Lab 02/15/16 1445  NA 127*  K 4.6  CL 93*  CO2 24  GLUCOSE 131*  BUN 12  CREATININE 0.86  CALCIUM 9.1   GFR: Estimated Creatinine Clearance: 28.9 mL/min (by C-G formula based on SCr of 0.86 mg/dL). Liver Function Tests:  Recent Labs Lab 02/15/16 1445  AST 23  ALT 12*  ALKPHOS 57  BILITOT 1.0  PROT 6.4*  ALBUMIN 3.4*    Recent Labs Lab 02/15/16 1445  LIPASE 24   Urine analysis:    Component Value Date/Time   COLORURINE YELLOW 02/15/2016 1923   APPEARANCEUR CLEAR 02/15/2016 1923   LABSPEC 1.007 02/15/2016 1923   PHURINE 7.0 02/15/2016 1923   GLUCOSEU NEGATIVE 02/15/2016 1923   HGBUR NEGATIVE 02/15/2016 1923   BILIRUBINUR NEGATIVE 02/15/2016 1923   KETONESUR  NEGATIVE 02/15/2016 1923   PROTEINUR NEGATIVE 02/15/2016 1923   UROBILINOGEN 0.2 03/23/2014 1621   NITRITE NEGATIVE 02/15/2016 1923   LEUKOCYTESUR NEGATIVE 02/15/2016 1923   Radiological Exams on Admission: Dg Chest 2 View  Result Date: 02/15/2016 CLINICAL DATA:  Chest pain, mid chest region. Recent fall. Cough and shortness of breath. EXAM: CHEST  2 VIEW COMPARISON:  January 06, 2016 and January 04, 2016 FINDINGS: There are small pleural effusions bilaterally with patchy interstitial edema in the bases. The lungs elsewhere are clear. Heart is mildly enlarged with pulmonary vascularity within normal limits. There is calcification throughout the aorta. No adenopathy. Bones are somewhat osteoporotic. There is anterior wedging of a mid thoracic vertebral body. No other fracture evident. No pneumothorax. IMPRESSION: Evidence of a degree of congestive heart failure. No appreciable airspace consolidation. There is aortic atherosclerosis. Bones osteoporotic with anterior wedge compression fracture in mid thoracic region. This fracture was not present 6 weeks prior and may well be related to recent apparent fall. Electronically Signed   By: Bretta Bang III M.D.   On: 02/15/2016 15:17    EKG: Independently reviewed. Noted above.  Assessment/Plan Principal Problem:   Epigastric pain Active Problems:   Essential hypertension   Hypothyroidism   Atrial fibrillation (HCC)   Vitamin D deficiency   Syncope and collapse   Cellulitis of left lower extremity   Hyponatremia   Chest pain   Pancreatic mass      Epigastric pain with history of abnormal appearance of pancreas on prior imaging --CT abdomen and pelvis with contrast now (patient's daughter gave consent for contrast after we discussed risks vs benefits) --Normal lipase noted, which would not be consistent with acute pancreatitis at this point --Heart healthy diet as tolerated --Analgesics and anti-emetics as needed  History of falls,  near syncope, possible chest pain, vascular congestion on chest xray (grade II diastolic dysfunction noted on echo in 2013) --Telemetry monitoring --Serial troponin --Complete echo in the morning --Document orthostatics  Probable thoracic compression fracture --CT of chest should help confirm (will get this with abdomen and pelvis images) --Analgesics as needed --Will need PT eval  and treat; Outpatient referral to IR or ortho as indicated  Hyponatremia, dehydration suspected --NS at 100cc/hr for one liter --Repeat BMP in the AM  Chronic anemia --Stable  Mild leukocytosis --Could be related to healing ulcer in left leg.  Avoid further antibiotics for now.  Wound consult as noted below.  History of LLE cellulitis, large ulcer still present with yellow drainage --Wound care consult --Dr. Lajoyce Corners added to care team.  Consider notifying him of admission if she is here through the weekend   DVT prophylaxis: Lovenox Code Status: DNR/DNI Family Communication: Daughter [name] and grandson present at bedside in the ED at time of admission.  [name] is POA.  [phone] (cell) Disposition Plan: To be determined Consults called: NONE Admission status: Place in Observation   TIME SPENT: 70 minutes   Jerene Bears MD [facility] Pager [phone]  If 7PM-7AM, please contact night-coverage [email or web address] Password [facility]  02/15/2016, 8:05 PM

## 2016-02-15 NOTE — ED Provider Notes (Signed)
[facility]-EMERGENCY DEPT Provider Note   CSN: [id number] Arrival date & time: 02/15/16  1422     History   Chief Complaint Chief Complaint  Patient presents with  . Chest Pain    HPI MALLEY HAUTER is a 82 y.o. female.  HPI  SERRINA MINOGUE is a 82 y.o. female with PMH significant for atrial fibrillation (not on coumadin due to falls), syncope, HTN, CVD who presents with 2 days of constant, mild, substernal and epigastric pain that radiates to the back.  She states the pain began after she choked on food 2 days ago.  Associated symptoms include dyspnea on exertion, fatigue, and shortness of breath.  Denies fever, N/V/C/D, or urinary symptoms.  She has fallen, but denies pain.  She denies any preceding symptoms such as CP, dizziness, or syncope.  She attributes her falls to "bad balance".  Past Medical History:  Diagnosis Date  . Atrial fibrillation (HCC)    not a candidate for coumadin due to falls  . AV block, 1st degree   . CVD (cerebrovascular disease)    History of CVA  . GERD (gastroesophageal reflux disease)   . History of echocardiogram 9/11   normal EF, mild to mod MR & TR, mod pulmonary HTN  . Hypertension   . Hypothyroidism   . Osteoarthritis   . Pneumonia 09/11  . Prediabetes   . Syncope and collapse    Negative loop recorder in the past  . Traumatic enucleation of eye    left eye    Patient Active Problem List   Diagnosis Date Noted  . Chest pain 02/15/2016  . Hematoma 01/26/2016  . Hyponatremia 01/26/2016  . AKI (acute kidney injury) (HCC) 01/26/2016  . Cellulitis of left lower extremity 01/25/2016  . Orthostatic hypotension 01/04/2016  . Syncope and collapse 01/04/2016  . Syncope 01/04/2016  . Malnutrition of mild degree (HCC) 11/13/2015  . Vitamin D deficiency 08/30/2014  . Medication management 08/30/2014  . Hyperlipidemia 06/29/2013  . Prediabetes   . GERD (gastroesophageal reflux disease)   . Essential hypertension   . AV block, 1st degree   .  CVD (cerebrovascular disease)   . Osteoarthritis   . Hypothyroidism   . Atrial fibrillation [facility])     Past Surgical History:  Procedure Laterality Date  . BACK SURGERY    . INGUINAL HERNIA REPAIR     left sided    OB History    No data available       Home Medications    Prior to Admission medications   Medication Sig Start Date End Date Taking? Authorizing Provider  acetaminophen (TYLENOL) 325 MG tablet Take 325-650 mg by mouth See admin instructions. 325 mg when Tramadol is taken; 650 mg as needed for headache or pain and NO Tramadol   Yes Historical Provider, MD  aspirin 81 MG tablet Take 81 mg by mouth at bedtime.    Yes Historical Provider, MD  beta carotene w/minerals (OCUVITE) tablet Take 1 tablet by mouth 2 (two) times daily.   Yes Historical Provider, MD  Cholecalciferol (VITAMIN D3) 1000 UNITS CAPS Take 1,000 Units by mouth 2 (two) times daily.    Yes Historical Provider, MD  docusate sodium (COLACE) 100 MG capsule Take 100 mg by mouth 2 (two) times daily as needed for mild constipation.  01/06/16  Yes Historical Provider, MD  DULoxetine (CYMBALTA) 60 MG capsule Take 1 capsule (60 mg total) by mouth daily. 02/06/16  Yes Lucky Cowboy, MD  gabapentin (NEURONTIN)  100 MG capsule Take 1 capsule (100 mg total) by mouth 2 (two) times daily. 02/01/16  Yes Courtney Forcucci, PA-C  lisinopril (PRINIVIL,ZESTRIL) 5 MG tablet Take 1 tablet (5 mg total) by mouth daily. 09/12/15  Yes Rosalio Macadamia, NP  MAGNESIUM PO Take 1 tablet by mouth daily.   Yes Historical Provider, MD  ondansetron (ZOFRAN) 4 MG tablet Take 1 tablet (4 mg total) by mouth every 6 (six) hours. Patient taking differently: Take 4 mg by mouth every 6 (six) hours as needed for nausea.  06/28/14  Yes Tilden Fossa, MD  SYNTHROID 75 MCG tablet TAKE 1 TABLET DAILY BEFORE BREAKFAST Patient taking differently: TAKE 1 TABLET (75 mcg) DAILY BEFORE BREAKFAST 10/09/15  Yes Quentin Mulling, PA-C  traMADol (ULTRAM) 50 MG tablet TAKE  1 TABLET BY MOUTH 3-4 TIMES PER DAY AS NEEDED FOR PAIN Patient taking differently: TAKE 1 TABLET (50 mg) BY MOUTH twice daily AS NEEDED FOR PAIN 02/14/16  Yes Quentin Mulling, PA-C    Family History Family History  Problem Relation Age of Onset  . Heart attack Father   . Cancer Brother   . Stroke Brother   . Heart attack Brother     Social History Social History  Substance Use Topics  . Smoking status: Never Smoker  . Smokeless tobacco: Never Used  . Alcohol use No     Allergies   Augmentin [amoxicillin-pot clavulanate]; Ciprocin-fluocin-procin [fluocinolone acetonide]; Ciprofloxacin hcl; Codeine; and Sulfa drugs cross reactors   Review of Systems Review of Systems All other systems negative unless otherwise stated in HPI   Physical Exam Updated Vital Signs BP 161/76   Pulse 76   Temp 98.2 F (36.8 C) (Oral)   Resp 19   SpO2 96%   Physical Exam  Constitutional: She is oriented to person, place, and time. She appears well-developed and well-nourished.  Non-toxic appearance. She does not have a sickly appearance. She does not appear ill.  HENT:  Head: Normocephalic and atraumatic.  Mouth/Throat: Oropharynx is clear and moist.  Eyes: Conjunctivae are normal.  Neck: Normal range of motion. Neck supple.  Cardiovascular: Normal rate and regular rhythm.   No lower extremity edema.   Pulmonary/Chest: Effort normal. No accessory muscle usage or stridor. No respiratory distress. She has decreased breath sounds. She has no wheezes. She has no rhonchi. She has no rales. She exhibits no tenderness.  Abdominal: Soft. Bowel sounds are normal. She exhibits no distension. There is tenderness in the epigastric area. There is no rebound and no guarding.  Musculoskeletal: Normal range of motion.  Lymphadenopathy:    She has no cervical adenopathy.  Neurological: She is alert and oriented to person, place, and time.  Speech clear without dysarthria.  Skin: Skin is warm and dry.    Psychiatric: She has a normal mood and affect. Her behavior is normal.     ED Treatments / Results  Labs (all labs ordered are listed, but only abnormal results are displayed) Labs Reviewed  BASIC METABOLIC PANEL - Abnormal; Notable for the following:       Result Value   Sodium 127 (*)    Chloride 93 (*)    Glucose, Bld 131 (*)    GFR calc non Af Amer 54 (*)    All other components within normal limits  CBC - Abnormal; Notable for the following:    WBC 10.7 (*)    RBC 3.46 (*)    Hemoglobin 10.5 (*)    HCT 33.0 (*)  All other components within normal limits  BRAIN NATRIURETIC PEPTIDE - Abnormal; Notable for the following:    B Natriuretic Peptide 263.6 (*)    All other components within normal limits  HEPATIC FUNCTION PANEL - Abnormal; Notable for the following:    Total Protein 6.4 (*)    Albumin 3.4 (*)    ALT 12 (*)    All other components within normal limits  LIPASE, BLOOD  URINALYSIS, ROUTINE W REFLEX MICROSCOPIC (NOT AT [facility])  I-STAT TROPOININ, ED  I-STAT TROPOININ, ED    EKG  EKG Interpretation  Date/Time:  Friday February 15 2016 14:28:34 EDT Ventricular Rate:  84 PR Interval:    QRS Duration: 76 QT Interval:  378 QTC Calculation: 446 R Axis:   59 Text Interpretation:  Atrial fibrillation Anterior infarct , age undetermined Abnormal ECG No STEMI. Compared to prior Confirmed by LONG MD, JOSHUA [phone]) on 02/15/2016 4:59:16 PM       Radiology Dg Chest 2 View  Result Date: 02/15/2016 CLINICAL DATA:  Chest pain, mid chest region. Recent fall. Cough and shortness of breath. EXAM: CHEST  2 VIEW COMPARISON:  January 06, 2016 and January 04, 2016 FINDINGS: There are small pleural effusions bilaterally with patchy interstitial edema in the bases. The lungs elsewhere are clear. Heart is mildly enlarged with pulmonary vascularity within normal limits. There is calcification throughout the aorta. No adenopathy. Bones are somewhat osteoporotic. There is anterior  wedging of a mid thoracic vertebral body. No other fracture evident. No pneumothorax. IMPRESSION: Evidence of a degree of congestive heart failure. No appreciable airspace consolidation. There is aortic atherosclerosis. Bones osteoporotic with anterior wedge compression fracture in mid thoracic region. This fracture was not present 6 weeks prior and may well be related to recent apparent fall. Electronically Signed   By: Bretta Bang III M.D.   On: 02/15/2016 15:17    Procedures Procedures (including critical care time)  Medications Ordered in ED Medications  aspirin chewable tablet 324 mg (not administered)  gi cocktail (Maalox,Lidocaine,Donnatal) (30 mLs Oral Given 02/15/16 1739)     Initial Impression / Assessment and Plan / ED Course  I have reviewed the triage vital signs and the nursing notes.  Pertinent labs & imaging results that were available during my care of the patient were reviewed by me and considered in my medical decision making (see chart for details).  Clinical Course   Patient presents with 2 days of substernal and epigastric pain preceded by a choking event. She also endorses shortness of breath and fatigue. VSS, NAD. EKG shows atrial fibrillation without ischemic changes. Patient has a known history of atrial fibrillation, and she is not anticoagulated due to fall risk. Initial troponin normal. Her chest x-ray does show a degree of CHF. Clinically she does not appear to be volume overloaded. BNP mildly elevated. In ED, she does have transient hypoxemia with oxygen saturation into the lower 80s. Remaining lab work remarkable for sodium of 127 which could explain her anorexia and increased falls recently. Heart score 5, she is high risk with risk factors of hypertension, hyperlipidemia, and cerebrovascular disease. At this time, patient would benefit from admission for ACS rule out, hyponatremia, and oxygen saturation monitoring.  Case has been discussed with and seen by  Dr. Jacqulyn Bath who agrees with the above plan for admission.   Final Clinical Impressions(s) / ED Diagnoses   Final diagnoses:  Chest pain, unspecified chest pain type  Hyponatremia  Shortness of breath    New Prescriptions  New Prescriptions   No medications on file     Cheri FowlerKayla Darah Simkin, PA-C 02/15/16 1920    Maia PlanJoshua G Long, MD 02/17/16 [id number]

## 2016-02-15 NOTE — ED Notes (Signed)
Pt is in ct, will transport to floor when returns from radiology

## 2016-02-15 NOTE — ED Notes (Signed)
Attempted report 

## 2016-02-15 NOTE — ED Notes (Signed)
Admitting physician at bedside for assessment.

## 2016-02-16 DIAGNOSIS — R072 Precordial pain: Secondary | ICD-10-CM | POA: Diagnosis not present

## 2016-02-16 DIAGNOSIS — E039 Hypothyroidism, unspecified: Secondary | ICD-10-CM | POA: Diagnosis not present

## 2016-02-16 DIAGNOSIS — I10 Essential (primary) hypertension: Secondary | ICD-10-CM | POA: Diagnosis not present

## 2016-02-16 DIAGNOSIS — R1013 Epigastric pain: Secondary | ICD-10-CM | POA: Diagnosis not present

## 2016-02-16 DIAGNOSIS — R0602 Shortness of breath: Secondary | ICD-10-CM | POA: Diagnosis not present

## 2016-02-16 DIAGNOSIS — E871 Hypo-osmolality and hyponatremia: Secondary | ICD-10-CM | POA: Diagnosis not present

## 2016-02-16 DIAGNOSIS — I87332 Chronic venous hypertension (idiopathic) with ulcer and inflammation of left lower extremity: Secondary | ICD-10-CM | POA: Diagnosis not present

## 2016-02-16 LAB — BASIC METABOLIC PANEL
Anion gap: 10 (ref 5–15)
BUN: 10 mg/dL (ref 6–20)
CALCIUM: 8.9 mg/dL (ref 8.9–10.3)
CO2: 25 mmol/L (ref 22–32)
CREATININE: 0.81 mg/dL (ref 0.44–1.00)
Chloride: 93 mmol/L — ABNORMAL LOW (ref 101–111)
GFR, EST NON AFRICAN AMERICAN: 59 mL/min — AB (ref >60)
GLUCOSE: 94 mg/dL (ref 65–99)
Potassium: 4.4 mmol/L (ref 3.5–5.1)
Sodium: 128 mmol/L — ABNORMAL LOW (ref 135–145)

## 2016-02-16 LAB — BRAIN NATRIURETIC PEPTIDE: B Natriuretic Peptide: 339.4 pg/mL — ABNORMAL HIGH (ref 0.0–100.0)

## 2016-02-16 LAB — TROPONIN I: Troponin I: 0.03 ng/mL (ref <0.03)

## 2016-02-16 LAB — CBC
HEMATOCRIT: 30.7 % — AB (ref 36.0–46.0)
Hemoglobin: 10.1 g/dL — ABNORMAL LOW (ref 12.0–15.0)
MCH: 31.1 pg (ref 26.0–34.0)
MCHC: 32.9 g/dL (ref 30.0–36.0)
MCV: 94.5 fL (ref 78.0–100.0)
PLATELETS: 362 10*3/uL (ref 150–400)
RBC: 3.25 MIL/uL — ABNORMAL LOW (ref 3.87–5.11)
RDW: 14.2 % (ref 11.5–15.5)
WBC: 9.5 10*3/uL (ref 4.0–10.5)

## 2016-02-16 LAB — OSMOLALITY: OSMOLALITY: 266 mosm/kg — AB (ref 275–295)

## 2016-02-16 MED ORDER — CARVEDILOL 3.125 MG PO TABS
3.1250 mg | ORAL_TABLET | Freq: Two times a day (BID) | ORAL | Status: DC
  Administered 2016-02-16 – 2016-02-18 (×5): 3.125 mg via ORAL
  Filled 2016-02-16 (×5): qty 1

## 2016-02-16 MED ORDER — FUROSEMIDE 10 MG/ML IJ SOLN
40.0000 mg | Freq: Once | INTRAMUSCULAR | Status: AC
  Administered 2016-02-16: 40 mg via INTRAVENOUS
  Filled 2016-02-16: qty 4

## 2016-02-16 MED ORDER — LIDOCAINE 5 % EX PTCH
1.0000 | MEDICATED_PATCH | CUTANEOUS | Status: DC
  Administered 2016-02-16 – 2016-02-19 (×4): 1 via TRANSDERMAL
  Filled 2016-02-16 (×5): qty 1

## 2016-02-16 MED ORDER — PANTOPRAZOLE SODIUM 40 MG PO TBEC
40.0000 mg | DELAYED_RELEASE_TABLET | Freq: Every day | ORAL | Status: DC
  Administered 2016-02-16 – 2016-02-19 (×4): 40 mg via ORAL
  Filled 2016-02-16 (×4): qty 1

## 2016-02-16 MED ORDER — GI COCKTAIL ~~LOC~~
30.0000 mL | Freq: Three times a day (TID) | ORAL | Status: DC
  Administered 2016-02-16 – 2016-02-19 (×9): 30 mL via ORAL
  Filled 2016-02-16 (×9): qty 30

## 2016-02-16 NOTE — Consult Note (Signed)
ORTHOPAEDIC CONSULTATION  REQUESTING PHYSICIAN: Leroy SeaPrashant K Singh, MD  Chief Complaint: Traumatic venous stasis ulcer left leg  HPI: [name] is a 82 y.o. female who presents with traumatic venous stasis ulcer left leg. Patient has had multiple falls recently falling backwards. Patient has a traumatic wound where she dropped a frozen item on her left leg sustaining a large wound. Patient has been treated with the Vive compression sock and has shown good interval healing over the past week. Of note patient had a venous stasis ulcer on the right leg which healed with the medical compression sock.  Past Medical History:  Diagnosis Date  . Atrial fibrillation (HCC)    not a candidate for coumadin due to falls  . AV block, 1st degree   . CVD (cerebrovascular disease)    History of CVA  . GERD (gastroesophageal reflux disease)   . History of echocardiogram 9/11   normal EF, mild to mod MR & TR, mod pulmonary HTN  . Hypertension   . Hypothyroidism   . Osteoarthritis   . Pneumonia 09/11  . Prediabetes   . Syncope and collapse    Negative loop recorder in the past  . Traumatic enucleation of eye    left eye   Past Surgical History:  Procedure Laterality Date  . BACK SURGERY    . INGUINAL HERNIA REPAIR     left sided   Social History   Social History  . Marital status: Single    Spouse name: N/A  . Number of children: N/A  . Years of education: N/A   Social History Main Topics  . Smoking status: Never Smoker  . Smokeless tobacco: Never Used  . Alcohol use No  . Drug use: No  . Sexual activity: No   Other Topics Concern  . None   Social History Narrative  . None   Family History  Problem Relation Age of Onset  . Heart attack Father   . Cancer Brother   . Stroke Brother   . Heart attack Brother    - negative except otherwise stated in the family history section Allergies  Allergen Reactions  . Augmentin [Amoxicillin-Pot Clavulanate] Diarrhea  .  Ciprocin-Fluocin-Procin [Fluocinolone Acetonide] Nausea And Vomiting  . Ciprofloxacin Hcl Nausea And Vomiting  . Codeine Hives and Nausea And Vomiting  . Sulfa Drugs Cross Reactors Hives and Nausea And Vomiting   Prior to Admission medications   Medication Sig Start Date End Date Taking? Authorizing Provider  acetaminophen (TYLENOL) 325 MG tablet Take 325-650 mg by mouth See admin instructions. 325 mg when Tramadol is taken; 650 mg as needed for headache or pain and NO Tramadol   Yes Historical Provider, MD  aspirin 81 MG tablet Take 81 mg by mouth at bedtime.    Yes Historical Provider, MD  beta carotene w/minerals (OCUVITE) tablet Take 1 tablet by mouth 2 (two) times daily.   Yes Historical Provider, MD  Cholecalciferol (VITAMIN D3) 1000 UNITS CAPS Take 1,000 Units by mouth 2 (two) times daily.    Yes Historical Provider, MD  docusate sodium (COLACE) 100 MG capsule Take 100 mg by mouth 2 (two) times daily as needed for mild constipation.  01/06/16  Yes Historical Provider, MD  DULoxetine (CYMBALTA) 60 MG capsule Take 1 capsule (60 mg total) by mouth daily. 02/06/16  Yes Lucky CowboyWilliam McKeown, MD  gabapentin (NEURONTIN) 100 MG capsule Take 1 capsule (100 mg total) by mouth 2 (two) times daily. 02/01/16  Yes Terri Piedraourtney Forcucci,  PA-C  lisinopril (PRINIVIL,ZESTRIL) 5 MG tablet Take 1 tablet (5 mg total) by mouth daily. 09/12/15  Yes Rosalio Macadamia, NP  MAGNESIUM PO Take 1 tablet by mouth daily.   Yes Historical Provider, MD  ondansetron (ZOFRAN) 4 MG tablet Take 1 tablet (4 mg total) by mouth every 6 (six) hours. Patient taking differently: Take 4 mg by mouth every 6 (six) hours as needed for nausea.  06/28/14  Yes Tilden Fossa, MD  SYNTHROID 75 MCG tablet TAKE 1 TABLET DAILY BEFORE BREAKFAST Patient taking differently: TAKE 1 TABLET (75 mcg) DAILY BEFORE BREAKFAST 10/09/15  Yes Quentin Mulling, PA-C  traMADol (ULTRAM) 50 MG tablet TAKE 1 TABLET BY MOUTH 3-4 TIMES PER DAY AS NEEDED FOR PAIN Patient taking  differently: TAKE 1 TABLET (50 mg) BY MOUTH twice daily AS NEEDED FOR PAIN 02/14/16  Yes Quentin Mulling, PA-C   Dg Chest 2 View  Result Date: 02/15/2016 CLINICAL DATA:  Chest pain, mid chest region. Recent fall. Cough and shortness of breath. EXAM: CHEST  2 VIEW COMPARISON:  January 06, 2016 and January 04, 2016 FINDINGS: There are small pleural effusions bilaterally with patchy interstitial edema in the bases. The lungs elsewhere are clear. Heart is mildly enlarged with pulmonary vascularity within normal limits. There is calcification throughout the aorta. No adenopathy. Bones are somewhat osteoporotic. There is anterior wedging of a mid thoracic vertebral body. No other fracture evident. No pneumothorax. IMPRESSION: Evidence of a degree of congestive heart failure. No appreciable airspace consolidation. There is aortic atherosclerosis. Bones osteoporotic with anterior wedge compression fracture in mid thoracic region. This fracture was not present 6 weeks prior and may well be related to recent apparent fall. Electronically Signed   By: Bretta Bang III M.D.   On: 02/15/2016 15:17   Ct Chest W Contrast  Result Date: 02/15/2016 CLINICAL DATA:  82 y/o F; chest pain with history of pneumonia and hypertension. Recent falls with probable compression fracture on chest radiograph. EXAM: CT CHEST WITH CONTRAST CT ABDOMEN AND PELVIS WITH CONTRAST TECHNIQUE: Multidetector CT imaging of the chest was performed during intravenous contrast administration. Multidetector CT imaging of the abdomen and pelvis was performed following the standard protocol during bolus administration of intravenous contrast. CONTRAST:  ISOVUE-300 IOPAMIDOL (ISOVUE-300) INJECTION 61% COMPARISON:  02/15/2016 chest radiograph. CT abdomen and pelvis 03/23/2014. CT chest 02/15/2010 FINDINGS: CT CHEST FINDINGS Cardiovascular: Right atrial and right ventricular enlargement. Coronary artery calcification. Normal size main pulmonary artery  and thoracic aorta. Aortic atherosclerosis with moderate calcifications. Mediastinum/Nodes: No enlarged mediastinal, hilar, or axillary lymph nodes. Thyroid gland, trachea, and esophagus demonstrate no significant findings. Lungs/Pleura: Biapical scarring with calcifications is stable. 4 mm nodule in the middle lobe along the minor fissure is stable. Linear opacities in lung bases are compatible with minor atelectasis. Mosaic attenuation of lung parenchyma and interlobular septal thickening consistent with mild pulmonary edema. Scattered 2-3 mm nodules in clusters probably represents a minimal bronchiolitis. Diffuse peribronchial thickening. Moderate right and small left pleural effusions. All Musculoskeletal: T7 moderate compression deformity. Chronic right third lateral and fourth lateral rib fractures. Left ninth posterior subacute rib fracture with callus. T9 vertebral body lucency with prominent vertical trabeculation compatible with hemangioma. CT ABDOMEN AND PELVIS FINDINGS Hepatobiliary: The segment 5, 16 mm enhancing focus (series 3, image 56). No other focal lesion liver lesion is identified. Density along posterior wall gallbladder may represent a polyp or stone and is stable. No biliary ductal dilatation. Pancreas: Persistent stable dilatation of the pancreatic duct in  the tail with atrophy. Otherwise the pancreas is unremarkable. Spleen: Normal in size without focal abnormality. Adrenals/Urinary Tract: Right kidney stable lower pole 11 mm cyst. Normal adrenal glands. No renal stone or obstructive uropathy is identified. Normal bladder. Stomach/Bowel: Extensive sigmoid diverticulosis without evidence of diverticulitis. No inflammatory or obstructive changes of the bowel. Normal appendix. Vascular/Lymphatic: Aortic atherosclerosis. No enlarged abdominal or pelvic lymph nodes. Other: Stable left lumbar triangle fat containing hernia. Musculoskeletal: Degenerative changes of the lumbar spine with disc space  narrowing greatest at L4-5 and L5-S1. No acute osseous abnormality is identified. IMPRESSION: 1. Interstitial pulmonary edema. Moderate right pleural effusion. Small left pleural effusion. 2. Cardiomegaly, predominantly right atrial and ventricular. Coronary artery calcifications. 3. Moderate T7 compression deformity. No significant retropulsion of bony elements. 4. Left 9th posterior rib subacute fracture with callus. 5. No acute process of abdomen or pelvis identified. 6. The 16 mm enhancing focus in the right lobe of liver was not definitely present on prior studies and is of uncertain significance. Consider follow-up CT or MRI in 6 months if clinically indicated. 7. Stable left lumbar triangle fat containing hernia. 8. Stable dilatation of the pancreatic duct in the pancreatic tail suggestive of slow growing or benign etiology such as IPMN. 9. Sigmoid diverticulosis without evidence of diverticulitis. 10. Aortic atherosclerosis. Electronically Signed   By: Mitzi Hansen M.D.   On: 02/15/2016 21:31   Ct Abdomen Pelvis W Contrast  Result Date: 02/15/2016 CLINICAL DATA:  82 y/o F; chest pain with history of pneumonia and hypertension. Recent falls with probable compression fracture on chest radiograph. EXAM: CT CHEST WITH CONTRAST CT ABDOMEN AND PELVIS WITH CONTRAST TECHNIQUE: Multidetector CT imaging of the chest was performed during intravenous contrast administration. Multidetector CT imaging of the abdomen and pelvis was performed following the standard protocol during bolus administration of intravenous contrast. CONTRAST:  ISOVUE-300 IOPAMIDOL (ISOVUE-300) INJECTION 61% COMPARISON:  02/15/2016 chest radiograph. CT abdomen and pelvis 03/23/2014. CT chest 02/15/2010 FINDINGS: CT CHEST FINDINGS Cardiovascular: Right atrial and right ventricular enlargement. Coronary artery calcification. Normal size main pulmonary artery and thoracic aorta. Aortic atherosclerosis with moderate  calcifications. Mediastinum/Nodes: No enlarged mediastinal, hilar, or axillary lymph nodes. Thyroid gland, trachea, and esophagus demonstrate no significant findings. Lungs/Pleura: Biapical scarring with calcifications is stable. 4 mm nodule in the middle lobe along the minor fissure is stable. Linear opacities in lung bases are compatible with minor atelectasis. Mosaic attenuation of lung parenchyma and interlobular septal thickening consistent with mild pulmonary edema. Scattered 2-3 mm nodules in clusters probably represents a minimal bronchiolitis. Diffuse peribronchial thickening. Moderate right and small left pleural effusions. All Musculoskeletal: T7 moderate compression deformity. Chronic right third lateral and fourth lateral rib fractures. Left ninth posterior subacute rib fracture with callus. T9 vertebral body lucency with prominent vertical trabeculation compatible with hemangioma. CT ABDOMEN AND PELVIS FINDINGS Hepatobiliary: The segment 5, 16 mm enhancing focus (series 3, image 56). No other focal lesion liver lesion is identified. Density along posterior wall gallbladder may represent a polyp or stone and is stable. No biliary ductal dilatation. Pancreas: Persistent stable dilatation of the pancreatic duct in the tail with atrophy. Otherwise the pancreas is unremarkable. Spleen: Normal in size without focal abnormality. Adrenals/Urinary Tract: Right kidney stable lower pole 11 mm cyst. Normal adrenal glands. No renal stone or obstructive uropathy is identified. Normal bladder. Stomach/Bowel: Extensive sigmoid diverticulosis without evidence of diverticulitis. No inflammatory or obstructive changes of the bowel. Normal appendix. Vascular/Lymphatic: Aortic atherosclerosis. No enlarged abdominal or pelvic lymph  nodes. Other: Stable left lumbar triangle fat containing hernia. Musculoskeletal: Degenerative changes of the lumbar spine with disc space narrowing greatest at L4-5 and L5-S1. No acute osseous  abnormality is identified. IMPRESSION: 1. Interstitial pulmonary edema. Moderate right pleural effusion. Small left pleural effusion. 2. Cardiomegaly, predominantly right atrial and ventricular. Coronary artery calcifications. 3. Moderate T7 compression deformity. No significant retropulsion of bony elements. 4. Left 9th posterior rib subacute fracture with callus. 5. No acute process of abdomen or pelvis identified. 6. The 16 mm enhancing focus in the right lobe of liver was not definitely present on prior studies and is of uncertain significance. Consider follow-up CT or MRI in 6 months if clinically indicated. 7. Stable left lumbar triangle fat containing hernia. 8. Stable dilatation of the pancreatic duct in the pancreatic tail suggestive of slow growing or benign etiology such as IPMN. 9. Sigmoid diverticulosis without evidence of diverticulitis. 10. Aortic atherosclerosis. Electronically Signed   By: Mitzi Hansen M.D.   On: 02/15/2016 21:31   - pertinent xrays, CT, MRI studies were reviewed and independently interpreted  Positive ROS: All other systems have been reviewed and were otherwise negative with the exception of those mentioned in the HPI and as above.  Physical Exam: General: Alert, no acute distress Psychiatric: Patient is competent for consent with normal mood and affect Lymphatic: No axillary or cervical lymphadenopathy Cardiovascular: No pedal edema Respiratory: No cyanosis, no use of accessory musculature GI: No organomegaly, abdomen is soft and non-tender  Skin: Patient has venous stasis changes in both legs with brawny skin color changes. She has a traumatic venostasis ulcer on the left leg which is 2 x 4 cm and 1 mm deep. She does have superficial epithelialization medially along the wound which shows good interval healing from her examination last week.    Neurologic: Patient has protective sensation bilateral lower extremities.   MUSCULOSKELETAL:  Examination  there is brawny skin color changes but no cellulitis no drainage no odor no signs of infection.  Assessment: Assessment: Healing traumatic venostasis ulcer left leg.  Plan: Plan: Patient's daughter will wash the leg with soap and water applied the Vive medical compression sock, they will change this daily. There is no need for any wound ointment or wound dressings the sock is to be worn directly against the wound. I will follow-up in the office.  Thank you for the consult and the opportunity to see Ms. Murlean Iba, MD [facility] [phone] 2:28 PM

## 2016-02-16 NOTE — Evaluation (Signed)
Clinical/Bedside Swallow Evaluation Patient Details  Name: [name] MRN: [id number] Date of Birth: 09/24/1914  Today's Date: 02/16/2016 Time: SLP Start Time (ACUTE ONLY): 1455 SLP Stop Time (ACUTE ONLY): 1520 SLP Time Calculation (min) (ACUTE ONLY): 25 min  Past Medical History:  Past Medical History:  Diagnosis Date  . Atrial fibrillation (HCC)    not a candidate for coumadin due to falls  . AV block, 1st degree   . CVD (cerebrovascular disease)    History of CVA  . GERD (gastroesophageal reflux disease)   . History of echocardiogram 9/11   normal EF, mild to mod MR & TR, mod pulmonary HTN  . Hypertension   . Hypothyroidism   . Osteoarthritis   . Pneumonia 09/11  . Prediabetes   . Syncope and collapse    Negative loop recorder in the past  . Traumatic enucleation of eye    left eye   Past Surgical History:  Past Surgical History:  Procedure Laterality Date  . BACK SURGERY    . INGUINAL HERNIA REPAIR     left sided   HPI:  82 y.o. female admitted with traumatic venous stasis ulcer L leg. Pt has had multiple falls recently falling backwards.   Assessment / Plan / Recommendation Clinical Impression  Patient presents with a mild oropharyngeal dysphagia characterized by decreased mastication and oral manipulation/transit of hard solid textures, and delayed cough with hard solids. Patient with suspected delayed swallow initiation, however laryngeal elevation and pharyngeal contraction were adequate per palpation.    Aspiration Risk  Mild aspiration risk    Diet Recommendation Dysphagia 3 (Mech soft);Thin liquid   Liquid Administration via: Cup;Straw Medication Administration: Whole meds with puree Supervision: Patient able to self feed;Intermittent supervision to cue for compensatory strategies;Comment (setup assist) Compensations: Minimize environmental distractions;Slow rate;Small sips/bites;Follow solids with liquid Postural Changes: Seated upright at 90 degrees     Other  Recommendations Oral Care Recommendations: Oral care BID   Follow up Recommendations None      Frequency and Duration min 1 x/week  2 weeks       Prognosis Prognosis for Safe Diet Advancement: Good      Swallow Study   General Date of Onset: 02/15/16 HPI: 82 y.o. female admitted with traumatic venous stasis ulcer L leg. Pt has had multiple falls recently falling backwards. Type of Study: Bedside Swallow Evaluation Previous Swallow Assessment: N/A Diet Prior to this Study: Regular;Thin liquids Temperature Spikes Noted: No Respiratory Status: Nasal cannula History of Recent Intubation: No Behavior/Cognition: Alert;Cooperative;Pleasant mood;Other (Comment) (very hard of hearing) Oral Cavity Assessment: Within Functional Limits Oral Care Completed by SLP: No Oral Cavity - Dentition: Edentulous Vision: Functional for self-feeding Self-Feeding Abilities: Able to feed self;Needs set up Patient Positioning: Upright in bed Baseline Vocal Quality: Normal Volitional Cough: Strong Volitional Swallow: Able to elicit    Oral/Motor/Sensory Function Overall Oral Motor/Sensory Function: Within functional limits   Ice Chips     Thin Liquid Thin Liquid: Within functional limits Presentation: Straw;Self Fed    Nectar Thick     Honey Thick     Puree Puree: Within functional limits Presentation: Self Fed;Spoon   Solid   GO   Solid: Impaired Oral Phase Impairments: Impaired mastication Pharyngeal Phase Impairments: Suspected delayed Swallow;Cough - Delayed Other Comments: Unable to determine cough from hard solids vs. congestion/irritation, but daughter reported that patient has exhibited some coughing at home with meals    Functional Assessment Tool Used: clinical judgement Functional Limitations: Swallowing Swallow Current Status (  [id number]): At least 20 percent but less than 40 percent impaired, limited or restricted Swallow Goal Status [phone]): At least 1 percent but less  than 20 percent impaired, limited or restricted      Angela Nevin, MA, CCC-SLP 02/16/16 4:53 PM

## 2016-02-16 NOTE — Care Management Note (Signed)
Case Management Note  Patient Details  Name: [name] MRN: [id number] Date of Birth: Aug 22, 1914  Subjective/Objective:  82 yo F presents with traumatic venous stasis ulcer L leg. Pt has had multiple falls recently falling backwards. Patient has a traumatic wound where she dropped a frozen item on her left leg sustaining a large wound.                   Action/Plan: CM referral to assist with D/C needs   Expected Discharge Date:  02/19/16               Expected Discharge Plan:     In-House Referral:     Discharge planning Services  CM Consult  Post Acute Care Choice:    Choice offered to:     DME Arranged:    DME Agency:     HH Arranged:    HH Agency:     Status of Service:  In process, will continue to follow  If discussed at Long Length of Stay Meetings, dates discussed:    Additional Comments: met with pt, grandson, and daughter Rip Harbour) who has POA. Discussed observation status. She signed the observation letter. Discussed d/c plan. Her daughter and grandson live with her. PT to evaluate pt. Informed daughter that we will f/u with PT recommendations and assist with the d/c plan. Explained SNF vs. HH. Mayme Genta' cell phone # [phone] and home # [phone].   Norina Buzzard, RN 02/16/2016, 2:41 PM

## 2016-02-16 NOTE — Care Management Obs Status (Signed)
MEDICARE OBSERVATION STATUS NOTIFICATION   Patient Details  Name: [name] MRN: [id number] Date of Birth: 11/20/1914   Medicare Observation Status Notification Given:  Yes    Isaias CowmanOliveras-Aizpurua, Wyman Meschke, RN 02/16/2016, 2:39 PM

## 2016-02-16 NOTE — Progress Notes (Signed)
PROGRESS NOTE                                                                                                                                                                                                             Patient Demographics:    [name], is a 82 y.o. female, DOB - 07/05/2014, ZOX:[id number]  Admit date - 02/15/2016   Admitting Physician Michael Litter, MD  Outpatient Primary MD for the patient is Nadean Corwin, MD  LOS - 0  Chief Complaint  Patient presents with  . Chest Pain       Brief Narrative    [name] is a 82 y.o. woman with a history of TIA/CVA, chronic atrial fibrillation (CHADS-Vasc score of at least 4, no long term anticoagulation due to fall risk), HTN, hypothyroidism, and vitamin D deficiency who presents with her daughter [name] (who is also her POA) for evaluation of epigastric pain, acute onset after having a choking episode three days ago.  She clearly localizes the pain to her epigastric area, with radiation to her back and into her chest.  She also reports associated shortness of breath.  No nausea or vomiting.  She has a dry cough.  No fever.  She has had recurrent falls as well.  These are usually without warning and have never resulted in LOC or confusion.  She denies headache or dizziness.  She has had vague vision disturbance, not particularly related to her presenting symptoms.    Her initial presenting symptoms were nonspecific epigastric pain, choking episode, mild shortness of breath and ongoing left leg ulcer for which she recently had antibiotic treatment. And is under the care of Dr. Lajoyce Corners.    Subjective:    [name] today has, No headache, No chest pain, Much improved epigastric abdominal pain - No Nausea, No new weakness tingling or numbness, No Cough - SOB.     Assessment  & Plan :     1.Mild epigastric pain nonspecific. CT scan abdomen and pelvis  nonacute, lipase stable, she could have mild gastritis, much improved after GI cocktail, will provide her for doses of GI cocktail and add PPI, outpatient GI follow-up. Due to her age we will try to avoid any invasive procedures until absolutely needed.  2. Choking episode. Currently stable, no cough or shortness of breath, have speech  evaluate.  3. Left leg ulcer. Recently finished treatment for cellulitis, I do not think it's clinically infected at this time, continue wound care and follow with Dr. Lajoyce Corners who she is already seeing in the outpatient setting.  4. Acute on chronic mild diastolic CHF. Last EF 60%. Repeat echo pending, BNP was elevated, gentle Lasix and monitor.  5. Fall at home with subacute T7 fracture, subacute left-sided ninth rib fracture. No weakness in lower extremities, no acute back pain, no radiation of pain into her legs, supportive care with lidocaine patch. PT evaluation, question placement.  6. Anemia of chronic disease. Stable no acute issues.  7. Hyponatremia. For now needs Lasix that she was short of breath due to dear CHF, will check urine sodium and osmolarity along with serum osmolality and monitor.  8. Possible near syncope at home. Monitor orthostatics and check echocardiogram.  9. Paroxysmal atrial fibrillation [place] vasc 2 score of at least 4. High fall risk therefore not on anticoagulation, will place her on low-dose beta blocker  10. Hypertension. Continue home dose ACE inhibitor, and low-dose beta blocker and monitor.  11. Hypothyroidism. Continue on Synthroid. Recent TSH was 2.4.  12. History of CVA in the past. On aspirin and continue.    Family Communication  :  None present  Code Status :  DNR  Diet : Heart Healthy  Disposition Plan  :  Home 1-2 days  Consults  :  [facility] Care, Dr duda if on call, have informed him  Procedures  :    TTE   CT Abd Pelvis -   IMPRESSION: 1. Interstitial pulmonary edema. Moderate right pleural effusion.  Small left pleural effusion. 2. Cardiomegaly, predominantly right atrial and ventricular. Coronary artery calcifications. 3. Moderate T7 compression deformity. No significant retropulsion of bony elements. 4. Left 9th posterior rib subacute fracture with callus. 5. No acute process of abdomen or pelvis identified. 6. The 16 mm enhancing focus in the right lobe of liver was not definitely present on prior studies and is of uncertain significance. Consider follow-up CT or MRI in 6 months if clinicallyindicated. 7. Stable left lumbar triangle fat containing hernia. 8. Stable dilatation of the pancreatic duct in the pancreatic tail suggestive of slow growing or benign etiology such as IPMN. 9. Sigmoid diverticulosis without evidence of diverticulitis. 10. Aortic atherosclerosis.    DVT Prophylaxis  :  Lovenox    Lab Results  Component Value Date   PLT 362 02/16/2016    Inpatient Medications  Scheduled Meds: . aspirin EC  81 mg Oral QHS  . DULoxetine  60 mg Oral Daily  . enoxaparin (LOVENOX) injection  30 mg Subcutaneous Q24H  . famotidine  20 mg Oral Q24H  . furosemide  40 mg Intravenous Once  . gabapentin  100 mg Oral BID  . gi cocktail  30 mL Oral TID  . levothyroxine  75 mcg Oral QAC breakfast  . lidocaine  1 patch Transdermal Q24H  . lisinopril  5 mg Oral Daily  . pantoprazole  40 mg Oral Daily   Continuous Infusions:  PRN Meds:.acetaminophen, docusate sodium, ondansetron (ZOFRAN) IV, traMADol  Antibiotics  :    Anti-infectives    None         Objective:   Vitals:   02/15/16 1927 02/15/16 2000 02/15/16 2100 02/16/16 0522  BP: 177/82 166/72 (!) 185/82 (!) 151/78  Pulse: 86 76 96 87  Resp: 25 21 14  (!) 22  Temp:   97.8 F (36.6 C) 98.2 F (  36.8 C)  TempSrc:   Oral Oral  SpO2: 99% 96% 99% 98%  Weight:   54.5 kg (120 lb 3.2 oz) 53.8 kg (118 lb 9.6 oz)  Height:   5\' 2"  (1.575 m)     Wt Readings from Last 3 Encounters:  02/16/16 53.8 kg (118 lb 9.6 oz)    02/06/16 52.8 kg (116 lb 6.4 oz)  01/21/16 49 kg (108 lb)     Intake/Output Summary (Last 24 hours) at 02/16/16 0831 Last data filed at 02/16/16 0646  Gross per 24 hour  Intake           511.67 ml  Output                0 ml  Net           511.67 ml     Physical Exam  Awake Alert, Oriented X 3, No new F.N deficits, Normal affect [place].AT,PERRAL, blind in left eye Supple Neck,No JVD, No cervical lymphadenopathy appriciated.  Symmetrical Chest wall movement, Good air movement bilaterally, CTAB RRR,No Gallops,Rubs or new Murmurs, No Parasternal Heave +ve B.Sounds, Abd Soft, No tenderness, No organomegaly appriciated, No rebound - guarding or rigidity. No Cyanosis, Clubbing or edema, No new Rash or bruise, L leg ulcer under bandage some Chronic surrounding discoloration without any warmth    Data Review:    CBC  Recent Labs Lab 02/15/16 1445 02/16/16 0120  WBC 10.7* 9.5  HGB 10.5* 10.1*  HCT 33.0* 30.7*  PLT 387 362  MCV 95.4 94.5  MCH 30.3 31.1  MCHC 31.8 32.9  RDW 14.5 14.2    Chemistries   Recent Labs Lab 02/15/16 1445 02/16/16 0120  NA 127* 128*  K 4.6 4.4  CL 93* 93*  CO2 24 25  GLUCOSE 131* 94  BUN 12 10  CREATININE 0.86 0.81  CALCIUM 9.1 8.9  AST 23  --   ALT 12*  --   ALKPHOS 57  --   BILITOT 1.0  --    ------------------------------------------------------------------------------------------------------------------ No results for input(s): CHOL, HDL, LDLCALC, TRIG, CHOLHDL, LDLDIRECT in the last 72 hours.  Lab Results  Component Value Date   HGBA1C 5.6 08/13/2015   ------------------------------------------------------------------------------------------------------------------ No results for input(s): TSH, T4TOTAL, T3FREE, THYROIDAB in the last 72 hours.  Invalid input(s): FREET3 ------------------------------------------------------------------------------------------------------------------ No results for input(s): VITAMINB12, FOLATE,  FERRITIN, TIBC, IRON, RETICCTPCT in the last 72 hours.  Coagulation profile No results for input(s): INR, PROTIME in the last 168 hours.  No results for input(s): DDIMER in the last 72 hours.  Cardiac Enzymes  Recent Labs Lab 02/15/16 2210 02/16/16 0120 02/16/16 0421  TROPONINI <0.03 <0.03 0.03*   ------------------------------------------------------------------------------------------------------------------    Component Value Date/Time   BNP 339.4 (H) 02/16/2016 0644   BNP 146.3 (H) 06/06/2011 1506    Micro Results No results found for this or any previous visit (from the past 240 hour(s)).  Radiology Reports Dg Chest 2 View  Result Date: 02/15/2016 CLINICAL DATA:  Chest pain, mid chest region. Recent fall. Cough and shortness of breath. EXAM: CHEST  2 VIEW COMPARISON:  January 06, 2016 and January 04, 2016 FINDINGS: There are small pleural effusions bilaterally with patchy interstitial edema in the bases. The lungs elsewhere are clear. Heart is mildly enlarged with pulmonary vascularity within normal limits. There is calcification throughout the aorta. No adenopathy. Bones are somewhat osteoporotic. There is anterior wedging of a mid thoracic vertebral body. No other fracture evident. No pneumothorax. IMPRESSION: Evidence of a degree of  congestive heart failure. No appreciable airspace consolidation. There is aortic atherosclerosis. Bones osteoporotic with anterior wedge compression fracture in mid thoracic region. This fracture was not present 6 weeks prior and may well be related to recent apparent fall. Electronically Signed   By: Bretta Bang III M.D.   On: 02/15/2016 15:17   Dg Tibia/fibula Left  Result Date: 01/17/2016 CLINICAL DATA:  Hematoma after trauma EXAM: LEFT TIBIA AND FIBULA - 2 VIEW COMPARISON:  None FINDINGS: The patient's known hematoma is identified.  No underlying fracture. IMPRESSION: Hematoma without fracture. Electronically Signed   By: Gerome Sam  III M.D   On: 01/17/2016 13:12   Ct Chest W Contrast  Result Date: 02/15/2016 CLINICAL DATA:  82 y/o F; chest pain with history of pneumonia and hypertension. Recent falls with probable compression fracture on chest radiograph. EXAM: CT CHEST WITH CONTRAST CT ABDOMEN AND PELVIS WITH CONTRAST TECHNIQUE: Multidetector CT imaging of the chest was performed during intravenous contrast administration. Multidetector CT imaging of the abdomen and pelvis was performed following the standard protocol during bolus administration of intravenous contrast. CONTRAST:  ISOVUE-300 IOPAMIDOL (ISOVUE-300) INJECTION 61% COMPARISON:  02/15/2016 chest radiograph. CT abdomen and pelvis 03/23/2014. CT chest 02/15/2010 FINDINGS: CT CHEST FINDINGS Cardiovascular: Right atrial and right ventricular enlargement. Coronary artery calcification. Normal size main pulmonary artery and thoracic aorta. Aortic atherosclerosis with moderate calcifications. Mediastinum/Nodes: No enlarged mediastinal, hilar, or axillary lymph nodes. Thyroid gland, trachea, and esophagus demonstrate no significant findings. Lungs/Pleura: Biapical scarring with calcifications is stable. 4 mm nodule in the middle lobe along the minor fissure is stable. Linear opacities in lung bases are compatible with minor atelectasis. Mosaic attenuation of lung parenchyma and interlobular septal thickening consistent with mild pulmonary edema. Scattered 2-3 mm nodules in clusters probably represents a minimal bronchiolitis. Diffuse peribronchial thickening. Moderate right and small left pleural effusions. All Musculoskeletal: T7 moderate compression deformity. Chronic right third lateral and fourth lateral rib fractures. Left ninth posterior subacute rib fracture with callus. T9 vertebral body lucency with prominent vertical trabeculation compatible with hemangioma. CT ABDOMEN AND PELVIS FINDINGS Hepatobiliary: The segment 5, 16 mm enhancing focus (series 3, image 56). No  other focal lesion liver lesion is identified. Density along posterior wall gallbladder may represent a polyp or stone and is stable. No biliary ductal dilatation. Pancreas: Persistent stable dilatation of the pancreatic duct in the tail with atrophy. Otherwise the pancreas is unremarkable. Spleen: Normal in size without focal abnormality. Adrenals/Urinary Tract: Right kidney stable lower pole 11 mm cyst. Normal adrenal glands. No renal stone or obstructive uropathy is identified. Normal bladder. Stomach/Bowel: Extensive sigmoid diverticulosis without evidence of diverticulitis. No inflammatory or obstructive changes of the bowel. Normal appendix. Vascular/Lymphatic: Aortic atherosclerosis. No enlarged abdominal or pelvic lymph nodes. Other: Stable left lumbar triangle fat containing hernia. Musculoskeletal: Degenerative changes of the lumbar spine with disc space narrowing greatest at L4-5 and L5-S1. No acute osseous abnormality is identified. IMPRESSION: 1. Interstitial pulmonary edema. Moderate right pleural effusion. Small left pleural effusion. 2. Cardiomegaly, predominantly right atrial and ventricular. Coronary artery calcifications. 3. Moderate T7 compression deformity. No significant retropulsion of bony elements. 4. Left 9th posterior rib subacute fracture with callus. 5. No acute process of abdomen or pelvis identified. 6. The 16 mm enhancing focus in the right lobe of liver was not definitely present on prior studies and is of uncertain significance. Consider follow-up CT or MRI in 6 months if clinically indicated. 7. Stable left lumbar triangle fat containing hernia. 8. Stable  dilatation of the pancreatic duct in the pancreatic tail suggestive of slow growing or benign etiology such as IPMN. 9. Sigmoid diverticulosis without evidence of diverticulitis. 10. Aortic atherosclerosis. Electronically Signed   By: Mitzi HansenLance  Furusawa-Stratton M.D.   On: 02/15/2016 21:31   Ct Abdomen Pelvis W Contrast  Result  Date: 02/15/2016 CLINICAL DATA:  82 y/o F; chest pain with history of pneumonia and hypertension. Recent falls with probable compression fracture on chest radiograph. EXAM: CT CHEST WITH CONTRAST CT ABDOMEN AND PELVIS WITH CONTRAST TECHNIQUE: Multidetector CT imaging of the chest was performed during intravenous contrast administration. Multidetector CT imaging of the abdomen and pelvis was performed following the standard protocol during bolus administration of intravenous contrast. CONTRAST:  100mL ISOVUE-300 IOPAMIDOL (ISOVUE-300) INJECTION 61% COMPARISON:  02/15/2016 chest radiograph. CT abdomen and pelvis 03/23/2014. CT chest 02/15/2010 FINDINGS: CT CHEST FINDINGS Cardiovascular: Right atrial and right ventricular enlargement. Coronary artery calcification. Normal size main pulmonary artery and thoracic aorta. Aortic atherosclerosis with moderate calcifications. Mediastinum/Nodes: No enlarged mediastinal, hilar, or axillary lymph nodes. Thyroid gland, trachea, and esophagus demonstrate no significant findings. Lungs/Pleura: Biapical scarring with calcifications is stable. 4 mm nodule in the middle lobe along the minor fissure is stable. Linear opacities in lung bases are compatible with minor atelectasis. Mosaic attenuation of lung parenchyma and interlobular septal thickening consistent with mild pulmonary edema. Scattered 2-3 mm nodules in clusters probably represents a minimal bronchiolitis. Diffuse peribronchial thickening. Moderate right and small left pleural effusions. All Musculoskeletal: T7 moderate compression deformity. Chronic right third lateral and fourth lateral rib fractures. Left ninth posterior subacute rib fracture with callus. T9 vertebral body lucency with prominent vertical trabeculation compatible with hemangioma. CT ABDOMEN AND PELVIS FINDINGS Hepatobiliary: The segment 5, 16 mm enhancing focus (series 3, image 56). No other focal lesion liver lesion is identified. Density along  posterior wall gallbladder may represent a polyp or stone and is stable. No biliary ductal dilatation. Pancreas: Persistent stable dilatation of the pancreatic duct in the tail with atrophy. Otherwise the pancreas is unremarkable. Spleen: Normal in size without focal abnormality. Adrenals/Urinary Tract: Right kidney stable lower pole 11 mm cyst. Normal adrenal glands. No renal stone or obstructive uropathy is identified. Normal bladder. Stomach/Bowel: Extensive sigmoid diverticulosis without evidence of diverticulitis. No inflammatory or obstructive changes of the bowel. Normal appendix. Vascular/Lymphatic: Aortic atherosclerosis. No enlarged abdominal or pelvic lymph nodes. Other: Stable left lumbar triangle fat containing hernia. Musculoskeletal: Degenerative changes of the lumbar spine with disc space narrowing greatest at L4-5 and L5-S1. No acute osseous abnormality is identified. IMPRESSION: 1. Interstitial pulmonary edema. Moderate right pleural effusion. Small left pleural effusion. 2. Cardiomegaly, predominantly right atrial and ventricular. Coronary artery calcifications. 3. Moderate T7 compression deformity. No significant retropulsion of bony elements. 4. Left 9th posterior rib subacute fracture with callus. 5. No acute process of abdomen or pelvis identified. 6. The 16 mm enhancing focus in the right lobe of liver was not definitely present on prior studies and is of uncertain significance. Consider follow-up CT or MRI in 6 months if clinically indicated. 7. Stable left lumbar triangle fat containing hernia. 8. Stable dilatation of the pancreatic duct in the pancreatic tail suggestive of slow growing or benign etiology such as IPMN. 9. Sigmoid diverticulosis without evidence of diverticulitis. 10. Aortic atherosclerosis. Electronically Signed   By: Mitzi HansenLance  Furusawa-Stratton M.D.   On: 02/15/2016 21:31   Ct Tibia Fibula Left W Contrast  Result Date: 01/28/2016 CLINICAL DATA:  Lower leg contusion 1 week  ago with  hematoma involving the right lower extremity. The hematoma is draining and there is surrounding cellulitis. EXAM: CT OF THE LEFT TIBIA AND FIBULA WITH CONTRAST TECHNIQUE: Standard CT imaging of the left lower extremity was performed with coronal and sagittal reformatted images. CONTRAST:  75mL ISOVUE-300 IOPAMIDOL (ISOVUE-300) INJECTION 61% COMPARISON:  Radiographs 01/17/2016 FINDINGS: There is a the cutaneous lesion which demonstrates high attenuation consistent with hematoma. It has the appearance of a large blister on the scan. No subcutaneous hematoma. Mild surrounding subcutaneous edema or inflammation below the lesion which could suggest cellulitis. No drainable soft tissue abscess. No findings for myofasciitis or pyomyositis. The knee and ankle joints are maintained. No findings to suggest septic arthritis. No destructive bony changes to suggest osteomyelitis. Extensive vascular calcifications are noted. IMPRESSION: Cutaneous complex fluid collection has the appearance of a large blister. It measures 7.7 x 3.8 x 1.0 cm. No significant surrounding cellulitis or subcutaneous abscess. No findings for myofasciitis or pyomyositis. No findings for septic arthritis or osteomyelitis. Electronically Signed   By: Rudie Meyer M.D.   On: 01/28/2016 16:14    Time Spent in minutes  30   Koua Deeg K M.D on 02/16/2016 at 8:31 AM  Between 7am to 7pm - Pager - [phone]  After 7pm go to [email or web address] - password [facility]  [facility] -  Office  [phone]

## 2016-02-17 ENCOUNTER — Other Ambulatory Visit (HOSPITAL_COMMUNITY): Payer: Self-pay

## 2016-02-17 DIAGNOSIS — R1013 Epigastric pain: Secondary | ICD-10-CM | POA: Diagnosis not present

## 2016-02-17 LAB — BASIC METABOLIC PANEL
Anion gap: 8 (ref 5–15)
BUN: 14 mg/dL (ref 6–20)
CHLORIDE: 94 mmol/L — AB (ref 101–111)
CO2: 27 mmol/L (ref 22–32)
CREATININE: 1.01 mg/dL — AB (ref 0.44–1.00)
Calcium: 8.7 mg/dL — ABNORMAL LOW (ref 8.9–10.3)
GFR calc Af Amer: 52 mL/min — ABNORMAL LOW (ref >60)
GFR calc non Af Amer: 45 mL/min — ABNORMAL LOW (ref >60)
GLUCOSE: 100 mg/dL — AB (ref 65–99)
POTASSIUM: 4.2 mmol/L (ref 3.5–5.1)
Sodium: 129 mmol/L — ABNORMAL LOW (ref 135–145)

## 2016-02-17 MED ORDER — SIMETHICONE 80 MG PO CHEW
80.0000 mg | CHEWABLE_TABLET | ORAL | Status: DC | PRN
  Administered 2016-02-17: 80 mg via ORAL
  Filled 2016-02-17: qty 1

## 2016-02-17 NOTE — Evaluation (Signed)
Physical Therapy Evaluation Patient Details Name: [name] MRN: [id number] DOB: [birth year] Today's Date: 02/17/2016   History of Present Illness  Pt adm with chest pain. Pt also with multiple recent falls and has subacute T7 fracture, subacute left-sided ninth rib fracture. PMH - afib, CVA, GERD, HTN, Traumatic venous stasis ulcer left leg  Clinical Impression  Pt admitted with above diagnosis and presents to PT with functional limitations due to deficits listed below (See PT problem list). Pt needs skilled PT to maximize independence and safety to allow discharge to ST-SNF. Expect pt will make steady progress.     Follow Up Recommendations SNF    Equipment Recommendations  None recommended by PT    Recommendations for Other Services       Precautions / Restrictions Precautions Precautions: Fall Restrictions Weight Bearing Restrictions: No      Mobility  Bed Mobility Overal bed mobility: Needs Assistance Bed Mobility: Supine to Sit;Sit to Supine     Supine to sit: Min assist Sit to supine: Min guard   General bed mobility comments: Assist to elevate trunk into sitting  Transfers Overall transfer level: Needs assistance Equipment used: 4-wheeled walker Transfers: Sit to/from Stand Sit to Stand: Min assist         General transfer comment: Assist for balance and to bring hips up  Ambulation/Gait Ambulation/Gait assistance: Min assist Ambulation Distance (Feet): 5 Feet (forward and backward) Assistive device: 4-wheeled walker Gait Pattern/deviations: Step-through pattern;Decreased step length - right;Decreased step length - left;Trunk flexed;Leaning posteriorly   Gait velocity interpretation: <1.8 ft/sec, indicative of risk for recurrent falls General Gait Details: Unsteady gait with assist for balance and support  Stairs            Wheelchair Mobility    Modified Rankin (Stroke Patients Only)       Balance Overall balance assessment: Needs  assistance Sitting-balance support: No upper extremity supported;Feet supported Sitting balance-Leahy Scale: Fair     Standing balance support: Bilateral upper extremity supported Standing balance-Leahy Scale: Poor Standing balance comment: walker and min A for static standing                             Pertinent Vitals/Pain Pain Assessment: No/denies pain    Home Living Family/patient expects to be discharged to:: Private residence Living Arrangements: Other relatives (granddaughter) Available Help at Discharge: Family;Available 24 hours/day Type of Home: House Home Access: Stairs to enter   [employer] of Steps: 2 Home Layout: One level Home Equipment: Walker - 4 wheels;Wheelchair - manual Dan Humphreys is 4 wheeled but had no seat and is not a rollator)      Prior Function Level of Independence: Needs assistance   Gait / Transfers Assistance Needed: Modified independent with walker but with recent falls           Hand Dominance        Extremity/Trunk Assessment   Upper Extremity Assessment: Generalized weakness           Lower Extremity Assessment: Generalized weakness         Communication   Communication: HOH  Cognition Arousal/Alertness: Awake/alert Behavior During Therapy: WFL for tasks assessed/performed Overall Cognitive Status: History of cognitive impairments - at baseline       Memory: Decreased short-term memory              General Comments      Exercises     Assessment/Plan  PT Assessment Patient needs continued PT services  PT Problem List Decreased strength;Decreased activity tolerance;Decreased balance;Decreased mobility          PT Treatment Interventions DME instruction;Gait training;Functional mobility training;Therapeutic exercise;Therapeutic activities;Balance training;Patient/family education    PT Goals (Current goals can be found in the Care Plan section)  Acute Rehab PT Goals Patient  Stated Goal: Not stated PT Goal Formulation: With patient/family Time For Goal Achievement: 03/02/16 Potential to Achieve Goals: Good    Frequency Min 3X/week   Barriers to discharge        Co-evaluation               End of Session Equipment Utilized During Treatment: Gait belt Activity Tolerance: Patient limited by fatigue Patient left: in bed;with call bell/phone within reach;with bed alarm set;with family/visitor present Nurse Communication: Mobility status    Functional Assessment Tool Used: clinical judgement Functional Limitation: Mobility: Walking and moving around Mobility: Walking and Moving Around Current Status ([id number](G8978): At least 20 percent but less than 40 percent impaired, limited or restricted Mobility: Walking and Moving Around Goal Status [phone]([id number]): At least 1 percent but less than 20 percent impaired, limited or restricted    Time: 1429-1445 PT Time Calculation (min) (ACUTE ONLY): 16 min   Charges:   PT Evaluation $PT Eval Moderate Complexity: 1 Procedure     PT G Codes:   PT G-Codes **NOT FOR INPATIENT CLASS** Functional Assessment Tool Used: clinical judgement Functional Limitation: Mobility: Walking and moving around Mobility: Walking and Moving Around Current Status (U9811(G8978): At least 20 percent but less than 40 percent impaired, limited or restricted Mobility: Walking and Moving Around Goal Status [phone]): At least 1 percent but less than 20 percent impaired, limited or restricted    [facility],Norena Bratton 02/17/2016, 3:09 PM [facility] Annina Piotrowski PT [phone]

## 2016-02-17 NOTE — Clinical Social Work Note (Signed)
Clinical Social Work Assessment  Patient Details  Name: [name] MRN: [id number] Date of Birth: December 22, 1914  Date of referral:  02/17/16               Reason for consult:  Facility Placement                Permission sought to share information with:  Family Supports Permission granted to share information::  Yes, Verbal Permission Granted  Name::     Ivar Bury  Relationship::  Daughter  Contact Information:  [phone]  Housing/Transportation Living arrangements for the past 2 months:  Single Family Home Source of Information:  Patient, Adult Children Patient Interpreter Needed:  None Criminal Activity/Legal Involvement Pertinent to Current Situation/Hospitalization:  No - Comment as needed Significant Relationships:  Adult Children, Other Family Members Lives with:  Relatives Do you feel safe going back to the place where you live?  No (Fall Risk) Need for family participation in patient care:  Yes (Comment)  Care giving concerns:  Patient granddaughter at bedside (who patient lives with) and states that patient has experienced three significant falls in a very short time frame and family does not believe that patient is safe at home even given the 24 hour support that is currently in place.  Patient and family agreeable to pursue SNF placement for a short term option.   Social Worker assessment / plan:  Holiday representative met with patient and patient granddaughter at bedside to offer support and discuss patient needs at discharge.  Patient with limited engagement in conversation but is agreeable to initiate SNF search and go a short term stay.  Patient granddaughter states that she is currently living with patient in patient home and between her and other family members they are providing 24 hour care.  Patient granddaughter states that in last 2-3 months patient has shown a steady decline in her strength and ability to manage at home.  CSW explained SNF search process.   Patient and family agreeable.  CSW to initiate SNF search in [place] and follow up with patient and family regarding available bed offers.  CSW remains available for support and to facilitate patient discharge needs once medically stable.  Employment status:  Retired Nurse, adult PT Recommendations:  Not assessed at this time Information / Referral to community resources:  [place]  Patient/Family's Response to care:  Patient family appreciative for CSW involvement and support.  Patient granddaughter states that patient is not willing to "give up" household duties and time spent at a SNF could offer her the ability to get rested prior to return home.  Patient/Family's Understanding of and Emotional Response to Diagnosis, Current Treatment, and Prognosis:  Patient family very understanding of patient current medical conditions and limitations regarding care needed for patient at discharge.  Patient family is on board and supportive with current plan, however does not seem ready to entertain the possibility of patient requiring more care on a long term basis.  Emotional Assessment Appearance:  Appears younger than stated age Attitude/Demeanor/Rapport:  Lethargic, Unable to Assess Affect (typically observed):  Unable to Assess, Irritable Orientation:  Oriented to Self, Oriented to Place, Oriented to  Time, Oriented to Situation Alcohol / Substance use:  Not Applicable Psych involvement (Current and /or in the community):  No (Comment)  Discharge Needs  Concerns to be addressed:  Discharge Planning Concerns Readmission within the last 30 days:  Yes Current discharge risk:  Physical Impairment Barriers  to Discharge:  Continued Medical Work up   [employer], [place]

## 2016-02-17 NOTE — Clinical Social Work Placement (Signed)
   CLINICAL SOCIAL WORK PLACEMENT  NOTE  Date:  02/17/2016  Patient Details  Name: Cecilie Lowerslma M Lamos MRN: [id number] Date of Birth: 10/24/1914  Clinical Social Work is seeking post-discharge placement for this patient at the Skilled  Nursing Facility level of care (*CSW will initial, date and re-position this form in  chart as items are completed):  Yes   Patient/family provided with [facility] Clinical Social Work Department's list of facilities offering this level of care within the geographic area requested by the patient (or if unable, by the patient's family).  Yes   Patient/family informed of their freedom to choose among providers that offer the needed level of care, that participate in Medicare, Medicaid or managed care program needed by the patient, have an available bed and are willing to accept the patient.  Yes   Patient/family informed of [facility]'s ownership interest in [facility] and [facility], as well as of the fact that they are under no obligation to receive care at these facilities.  PASRR submitted to EDS on 02/17/16     PASRR number received on       Existing PASRR number confirmed on       FL2 transmitted to all facilities in geographic area requested by pt/family on 02/17/16     FL2 transmitted to all facilities within larger geographic area on       Patient informed that his/her managed care company has contracts with or will negotiate with certain facilities, including the following:            Patient/family informed of bed offers received.  Patient chooses bed at       Physician recommends and patient chooses bed at      Patient to be transferred to   on  .  Patient to be transferred to facility by       Patient family notified on   of transfer.  Name of family member notified:        PHYSICIAN Please sign FL2     Additional Comment:    Macario GoldsJesse Tanna Loeffler, LCSW [phone]

## 2016-02-17 NOTE — NC FL2 (Signed)
[place] MEDICAID FL2 LEVEL OF CARE SCREENING TOOL     IDENTIFICATION  Patient Name: [name] Birthdate: 06/23/1914 Sex: female Admission Date (Current Location): 02/15/2016  [facility] [place] [place]   Facility and Address:  [facility], [address], [place], [place] [address]      Provider Number: [id number]  Attending Physician Name and Address:  Leroy SeaPrashant K Singh, MD  Relative Name and Phone Number:       Current Level of Care: Hospital Recommended Level of Care: Skilled Nursing Facility Prior Approval Number:    Date Approved/Denied:   PASRR Number:   [id number]   Discharge Plan: SNF    Current Diagnoses: Patient Active Problem List   Diagnosis Date Noted  . Chest pain 02/15/2016  . Epigastric pain 02/15/2016  . Pancreatic mass 02/15/2016  . Hematoma 01/26/2016  . Hyponatremia 01/26/2016  . AKI (acute kidney injury) (HCC) 01/26/2016  . Cellulitis of left lower extremity 01/25/2016  . Orthostatic hypotension 01/04/2016  . Syncope and collapse 01/04/2016  . Syncope 01/04/2016  . Malnutrition of mild degree (HCC) 11/13/2015  . Vitamin D deficiency 08/30/2014  . Medication management 08/30/2014  . Hyperlipidemia 06/29/2013  . Prediabetes   . GERD (gastroesophageal reflux disease)   . Essential hypertension   . AV block, 1st degree   . CVD (cerebrovascular disease)   . Osteoarthritis   . Hypothyroidism   . Atrial fibrillation (HCC)     Orientation RESPIRATION BLADDER Height & Weight     Self, Time, Place  Normal Continent Weight: 117 lb 11.2 oz (53.4 kg) Height:  5\' 2"  (157.5 cm)  BEHAVIORAL SYMPTOMS/MOOD NEUROLOGICAL BOWEL NUTRITION STATUS   (none)  (none) Continent  (DYS3)  AMBULATORY STATUS COMMUNICATION OF NEEDS Skin   Extensive Assist Verbally Surgical wounds                       Personal Care Assistance Level of Assistance  Bathing, Feeding, Dressing Bathing Assistance: Limited  assistance Feeding assistance: Independent Dressing Assistance: Limited assistance     Functional Limitations Info  Sight, Hearing, Speech Sight Info: Adequate Hearing Info: Adequate Speech Info: Adequate    SPECIAL CARE FACTORS FREQUENCY                       Contractures Contractures Info: Not present    Additional Factors Info  Code Status, Allergies Code Status Info:  (DNR) Allergies Info:  (Augmentin Amoxicillin-pot Clavulanate, Ciprocin-fluocin-procin Fluocinolone Acetonide, Ciprofloxacin Hcl, Codeine, Sulfa Drugs Cross Reactors)           Current Medications (02/17/2016):  This is the current hospital active medication list Current Facility-Administered Medications  Medication Dose Route Frequency Provider Last Rate Last Dose  . acetaminophen (TYLENOL) tablet 650 mg  650 mg Oral Q4H PRN Michael LitterNikki Carter, MD      . aspirin EC tablet 81 mg  81 mg Oral QHS Michael LitterNikki Carter, MD   81 mg at 02/16/16 2109  . carvedilol (COREG) tablet 3.125 mg  3.125 mg Oral BID WC Leroy SeaPrashant K Singh, MD   3.125 mg at 02/17/16 0928  . docusate sodium (COLACE) capsule 100 mg  100 mg Oral BID PRN Michael LitterNikki Carter, MD      . DULoxetine (CYMBALTA) DR capsule 60 mg  60 mg Oral Daily Michael LitterNikki Carter, MD   60 mg at 02/17/16 [phone]  . enoxaparin (LOVENOX) injection 30 mg  30 mg Subcutaneous Q24H Lowella BandyNikki  Montez Morita, MD   30 mg at 02/16/16 2109  . famotidine (PEPCID) tablet 20 mg  20 mg Oral Q24H Michael Litter, MD   20 mg at 02/16/16 2109  . gabapentin (NEURONTIN) capsule 100 mg  100 mg Oral BID Michael Litter, MD   100 mg at 02/17/16 1610  . gi cocktail (Maalox,Lidocaine,Donnatal)  30 mL Oral TID Leroy Sea, MD   30 mL at 02/17/16 0927  . levothyroxine (SYNTHROID, LEVOTHROID) tablet 75 mcg  75 mcg Oral QAC breakfast Michael Litter, MD   75 mcg at 02/17/16 [phone]  . lidocaine (LIDODERM) 5 % 1 patch  1 patch Transdermal Q24H Leroy Sea, MD   1 patch at 02/17/16 [phone]  . lisinopril (PRINIVIL,ZESTRIL) tablet 5 mg  5 mg  Oral Daily Michael Litter, MD   5 mg at 02/17/16 8119  . ondansetron (ZOFRAN) injection 4 mg  4 mg Intravenous Q6H PRN Michael Litter, MD      . pantoprazole (PROTONIX) EC tablet 40 mg  40 mg Oral Daily Leroy Sea, MD   40 mg at 02/17/16 0928  . traMADol (ULTRAM) tablet 50 mg  50 mg Oral Q6H PRN Michael Litter, MD   50 mg at 02/15/16 2204     Discharge Medications: Please see discharge summary for a list of discharge medications.  Relevant Imaging Results:  Relevant Lab Results:   Additional Information SS#: [id number]  Terald Sleeper, LCSW

## 2016-02-17 NOTE — Progress Notes (Signed)
PROGRESS NOTE                                                                                                                                                                                                             Patient Demographics:    [name], is a 82 y.o. female, DOB - 1915/05/23, ZOX:[id number]  Admit date - 02/15/2016   Admitting Physician Michael Litter, MD  Outpatient Primary MD for the patient is Nadean Corwin, MD  LOS - 0  Chief Complaint  Patient presents with  . Chest Pain       Brief Narrative    [name] is a 82 y.o. woman with a history of TIA/CVA, chronic atrial fibrillation (CHADS-Vasc score of at least 4, no long term anticoagulation due to fall risk), HTN, hypothyroidism, and vitamin D deficiency who presents with her daughter [name] (who is also her POA) for evaluation of epigastric pain, acute onset after having a choking episode three days ago.    Gastric pain is much better on GI cocktail and PPI, CT abdomen and pelvis was nonacute, she has been seen by Dr. Lajoyce Corners for her lower extremity wound and deemed clear from his standpoint for outpatient follow-up, for her falls she is orthostatic negative, we are waiting repeat echocardiogram which is still pending.    Subjective:    Vernette Moise today has, No headache, No chest pain, Much improved epigastric abdominal pain - No Nausea, No new weakness tingling or numbness, No Cough - SOB.     Assessment  & Plan :     1.Mild epigastric pain nonspecific. CT scan abdomen and pelvis nonacute, lipase stable, she could have mild gastritis, much improved after GI cocktail & PPI will continue,outpatient GI follow-up. Due to her age we will try to avoid any invasive procedures until absolutely needed.  2. Choking episode. Currently stable, no cough or shortness of breath, speech following on D3 diet.  3. Left leg ulcer. Recently finished  treatment for cellulitis, I do not think it's clinically infected at this time, seen by Dr Lajoyce Corners here, continue medicated sock follow in the office.  4. Acute on chronic mild diastolic CHF. Last EF 60%. Repeat echo pending, BNP was elevated, post gentle Lasix and monitor.  5. Fall at home with subacute T7 fracture, subacute left-sided ninth rib fracture. No  weakness in lower extremities, no acute back pain, no radiation of pain into her legs, supportive care with lidocaine patch. PT evaluation, question placement.  6. Anemia of chronic disease. Stable no acute issues.  7. Hyponatremia. For now needs Lasix that she was short of breath due to dear CHF, will check urine sodium and osmolarity along with serum osmolality and monitor.  8. Possible near syncope at home. Negative orthostatics and check echocardiogram.  9. Paroxysmal atrial fibrillation [place] vasc 2 score of at least 4. High fall risk therefore not on anticoagulation, will place her on low-dose beta blocker  10. Hypertension. Continue home dose ACE inhibitor, and low-dose beta blocker and monitor.  11. Hypothyroidism. Continue on Synthroid. Recent TSH was 2.4.  12. History of CVA in the past. On aspirin and continue.    Family Communication  :  None present  Code Status :  DNR  Diet : Heart Healthy  Disposition Plan  :  Home 1-2 days  Consults  :  W Care, Dr Lajoyce Corners   Procedures  :    TTE   CT Abd Pelvis -   IMPRESSION: 1. Interstitial pulmonary edema. Moderate right pleural effusion. Small left pleural effusion. 2. Cardiomegaly, predominantly right atrial and ventricular. Coronary artery calcifications. 3. Moderate T7 compression deformity. No significant retropulsion of bony elements. 4. Left 9th posterior rib subacute fracture with callus. 5. No acute process of abdomen or pelvis identified. 6. The 16 mm enhancing focus in the right lobe of liver was not definitely present on prior studies and is of uncertain  significance. Consider follow-up CT or MRI in 6 months if clinicallyindicated. 7. Stable left lumbar triangle fat containing hernia. 8. Stable dilatation of the pancreatic duct in the pancreatic tail suggestive of slow growing or benign etiology such as IPMN. 9. Sigmoid diverticulosis without evidence of diverticulitis. 10. Aortic atherosclerosis.    DVT Prophylaxis  :  Lovenox    Lab Results  Component Value Date   PLT 362 02/16/2016    Inpatient Medications  Scheduled Meds: . aspirin EC  81 mg Oral QHS  . carvedilol  3.125 mg Oral BID WC  . DULoxetine  60 mg Oral Daily  . enoxaparin (LOVENOX) injection  30 mg Subcutaneous Q24H  . famotidine  20 mg Oral Q24H  . gabapentin  100 mg Oral BID  . gi cocktail  30 mL Oral TID  . levothyroxine  75 mcg Oral QAC breakfast  . lidocaine  1 patch Transdermal Q24H  . lisinopril  5 mg Oral Daily  . pantoprazole  40 mg Oral Daily   Continuous Infusions:  PRN Meds:.acetaminophen, docusate sodium, ondansetron (ZOFRAN) IV, traMADol  Antibiotics  :    Anti-infectives    None         Objective:   Vitals:   02/16/16 2000 02/16/16 2003 02/16/16 2006 02/17/16 0233  BP: (!) 123/55 133/78 (!) 151/79 (!) 148/68  Pulse: 62 77 75 73  Resp: 19   (!) 21  Temp: 98.4 F (36.9 C)   98 F (36.7 C)  TempSrc:      SpO2: 99%   96%  Weight:    53.4 kg (117 lb 11.2 oz)  Height:        Wt Readings from Last 3 Encounters:  02/17/16 53.4 kg (117 lb 11.2 oz)  02/06/16 52.8 kg (116 lb 6.4 oz)  01/21/16 49 kg (108 lb)     Intake/Output Summary (Last 24 hours) at 02/17/16 1029 Last data filed  at 02/17/16 0838  Gross per 24 hour  Intake              120 ml  Output              200 ml  Net              -80 ml     Physical Exam  Awake Alert, Oriented X 3, No new F.N deficits, Normal affect [place].AT,PERRAL, blind in left eye Supple Neck,No JVD, No cervical lymphadenopathy appriciated.  Symmetrical Chest wall movement, Good air movement  bilaterally, CTAB RRR,No Gallops,Rubs or new Murmurs, No Parasternal Heave +ve B.Sounds, Abd Soft, No tenderness, No organomegaly appriciated, No rebound - guarding or rigidity. No Cyanosis, Clubbing or edema, No new Rash or bruise, L leg ulcer under bandage some Chronic surrounding discoloration without any warmth    Data Review:    CBC  Recent Labs Lab 02/15/16 1445 02/16/16 0120  WBC 10.7* 9.5  HGB 10.5* 10.1*  HCT 33.0* 30.7*  PLT 387 362  MCV 95.4 94.5  MCH 30.3 31.1  MCHC 31.8 32.9  RDW 14.5 14.2    Chemistries   Recent Labs Lab 02/15/16 1445 02/16/16 0120 02/17/16 0407  NA 127* 128* 129*  K 4.6 4.4 4.2  CL 93* 93* 94*  CO2 24 25 27   GLUCOSE 131* 94 100*  BUN 12 10 14   CREATININE 0.86 0.81 1.01*  CALCIUM 9.1 8.9 8.7*  AST 23  --   --   ALT 12*  --   --   ALKPHOS 57  --   --   BILITOT 1.0  --   --    ------------------------------------------------------------------------------------------------------------------ No results for input(s): CHOL, HDL, LDLCALC, TRIG, CHOLHDL, LDLDIRECT in the last 72 hours.  Lab Results  Component Value Date   HGBA1C 5.6 08/13/2015   ------------------------------------------------------------------------------------------------------------------ No results for input(s): TSH, T4TOTAL, T3FREE, THYROIDAB in the last 72 hours.  Invalid input(s): FREET3 ------------------------------------------------------------------------------------------------------------------ No results for input(s): VITAMINB12, FOLATE, FERRITIN, TIBC, IRON, RETICCTPCT in the last 72 hours.  Coagulation profile No results for input(s): INR, PROTIME in the last 168 hours.  No results for input(s): DDIMER in the last 72 hours.  Cardiac Enzymes  Recent Labs Lab 02/15/16 2210 02/16/16 0120 02/16/16 0421  TROPONINI <0.03 <0.03 0.03*    ------------------------------------------------------------------------------------------------------------------    Component Value Date/Time   BNP 339.4 (H) 02/16/2016 0644   BNP 146.3 (H) 06/06/2011 1506    Micro Results No results found for this or any previous visit (from the past 240 hour(s)).  Radiology Reports Dg Chest 2 View  Result Date: 02/15/2016 CLINICAL DATA:  Chest pain, mid chest region. Recent fall. Cough and shortness of breath. EXAM: CHEST  2 VIEW COMPARISON:  January 06, 2016 and January 04, 2016 FINDINGS: There are small pleural effusions bilaterally with patchy interstitial edema in the bases. The lungs elsewhere are clear. Heart is mildly enlarged with pulmonary vascularity within normal limits. There is calcification throughout the aorta. No adenopathy. Bones are somewhat osteoporotic. There is anterior wedging of a mid thoracic vertebral body. No other fracture evident. No pneumothorax. IMPRESSION: Evidence of a degree of congestive heart failure. No appreciable airspace consolidation. There is aortic atherosclerosis. Bones osteoporotic with anterior wedge compression fracture in mid thoracic region. This fracture was not present 6 weeks prior and may well be related to recent apparent fall. Electronically Signed   By: Bretta Bang III M.D.   On: 02/15/2016 15:17   Ct Chest W Contrast  Result Date: 02/15/2016 CLINICAL DATA:  82 y/o F; chest pain with history of pneumonia and hypertension. Recent falls with probable compression fracture on chest radiograph. EXAM: CT CHEST WITH CONTRAST CT ABDOMEN AND PELVIS WITH CONTRAST TECHNIQUE: Multidetector CT imaging of the chest was performed during intravenous contrast administration. Multidetector CT imaging of the abdomen and pelvis was performed following the standard protocol during bolus administration of intravenous contrast. CONTRAST:  ISOVUE-300 IOPAMIDOL (ISOVUE-300) INJECTION 61% COMPARISON:  02/15/2016 chest  radiograph. CT abdomen and pelvis 03/23/2014. CT chest 02/15/2010 FINDINGS: CT CHEST FINDINGS Cardiovascular: Right atrial and right ventricular enlargement. Coronary artery calcification. Normal size main pulmonary artery and thoracic aorta. Aortic atherosclerosis with moderate calcifications. Mediastinum/Nodes: No enlarged mediastinal, hilar, or axillary lymph nodes. Thyroid gland, trachea, and esophagus demonstrate no significant findings. Lungs/Pleura: Biapical scarring with calcifications is stable. 4 mm nodule in the middle lobe along the minor fissure is stable. Linear opacities in lung bases are compatible with minor atelectasis. Mosaic attenuation of lung parenchyma and interlobular septal thickening consistent with mild pulmonary edema. Scattered 2-3 mm nodules in clusters probably represents a minimal bronchiolitis. Diffuse peribronchial thickening. Moderate right and small left pleural effusions. All Musculoskeletal: T7 moderate compression deformity. Chronic right third lateral and fourth lateral rib fractures. Left ninth posterior subacute rib fracture with callus. T9 vertebral body lucency with prominent vertical trabeculation compatible with hemangioma. CT ABDOMEN AND PELVIS FINDINGS Hepatobiliary: The segment 5, 16 mm enhancing focus (series 3, image 56). No other focal lesion liver lesion is identified. Density along posterior wall gallbladder may represent a polyp or stone and is stable. No biliary ductal dilatation. Pancreas: Persistent stable dilatation of the pancreatic duct in the tail with atrophy. Otherwise the pancreas is unremarkable. Spleen: Normal in size without focal abnormality. Adrenals/Urinary Tract: Right kidney stable lower pole 11 mm cyst. Normal adrenal glands. No renal stone or obstructive uropathy is identified. Normal bladder. Stomach/Bowel: Extensive sigmoid diverticulosis without evidence of diverticulitis. No inflammatory or obstructive changes of the bowel. Normal  appendix. Vascular/Lymphatic: Aortic atherosclerosis. No enlarged abdominal or pelvic lymph nodes. Other: Stable left lumbar triangle fat containing hernia. Musculoskeletal: Degenerative changes of the lumbar spine with disc space narrowing greatest at L4-5 and L5-S1. No acute osseous abnormality is identified. IMPRESSION: 1. Interstitial pulmonary edema. Moderate right pleural effusion. Small left pleural effusion. 2. Cardiomegaly, predominantly right atrial and ventricular. Coronary artery calcifications. 3. Moderate T7 compression deformity. No significant retropulsion of bony elements. 4. Left 9th posterior rib subacute fracture with callus. 5. No acute process of abdomen or pelvis identified. 6. The 16 mm enhancing focus in the right lobe of liver was not definitely present on prior studies and is of uncertain significance. Consider follow-up CT or MRI in 6 months if clinically indicated. 7. Stable left lumbar triangle fat containing hernia. 8. Stable dilatation of the pancreatic duct in the pancreatic tail suggestive of slow growing or benign etiology such as IPMN. 9. Sigmoid diverticulosis without evidence of diverticulitis. 10. Aortic atherosclerosis. Electronically Signed   By: Mitzi Hansen M.D.   On: 02/15/2016 21:31   Ct Abdomen Pelvis W Contrast  Result Date: 02/15/2016 CLINICAL DATA:  82 y/o F; chest pain with history of pneumonia and hypertension. Recent falls with probable compression fracture on chest radiograph. EXAM: CT CHEST WITH CONTRAST CT ABDOMEN AND PELVIS WITH CONTRAST TECHNIQUE: Multidetector CT imaging of the chest was performed during intravenous contrast administration. Multidetector CT imaging of the abdomen and pelvis was performed following the standard protocol during bolus  administration of intravenous contrast. CONTRAST:  100mL ISOVUE-300 IOPAMIDOL (ISOVUE-300) INJECTION 61% COMPARISON:  02/15/2016 chest radiograph. CT abdomen and pelvis 03/23/2014. CT chest  02/15/2010 FINDINGS: CT CHEST FINDINGS Cardiovascular: Right atrial and right ventricular enlargement. Coronary artery calcification. Normal size main pulmonary artery and thoracic aorta. Aortic atherosclerosis with moderate calcifications. Mediastinum/Nodes: No enlarged mediastinal, hilar, or axillary lymph nodes. Thyroid gland, trachea, and esophagus demonstrate no significant findings. Lungs/Pleura: Biapical scarring with calcifications is stable. 4 mm nodule in the middle lobe along the minor fissure is stable. Linear opacities in lung bases are compatible with minor atelectasis. Mosaic attenuation of lung parenchyma and interlobular septal thickening consistent with mild pulmonary edema. Scattered 2-3 mm nodules in clusters probably represents a minimal bronchiolitis. Diffuse peribronchial thickening. Moderate right and small left pleural effusions. All Musculoskeletal: T7 moderate compression deformity. Chronic right third lateral and fourth lateral rib fractures. Left ninth posterior subacute rib fracture with callus. T9 vertebral body lucency with prominent vertical trabeculation compatible with hemangioma. CT ABDOMEN AND PELVIS FINDINGS Hepatobiliary: The segment 5, 16 mm enhancing focus (series 3, image 56). No other focal lesion liver lesion is identified. Density along posterior wall gallbladder may represent a polyp or stone and is stable. No biliary ductal dilatation. Pancreas: Persistent stable dilatation of the pancreatic duct in the tail with atrophy. Otherwise the pancreas is unremarkable. Spleen: Normal in size without focal abnormality. Adrenals/Urinary Tract: Right kidney stable lower pole 11 mm cyst. Normal adrenal glands. No renal stone or obstructive uropathy is identified. Normal bladder. Stomach/Bowel: Extensive sigmoid diverticulosis without evidence of diverticulitis. No inflammatory or obstructive changes of the bowel. Normal appendix. Vascular/Lymphatic: Aortic atherosclerosis. No  enlarged abdominal or pelvic lymph nodes. Other: Stable left lumbar triangle fat containing hernia. Musculoskeletal: Degenerative changes of the lumbar spine with disc space narrowing greatest at L4-5 and L5-S1. No acute osseous abnormality is identified. IMPRESSION: 1. Interstitial pulmonary edema. Moderate right pleural effusion. Small left pleural effusion. 2. Cardiomegaly, predominantly right atrial and ventricular. Coronary artery calcifications. 3. Moderate T7 compression deformity. No significant retropulsion of bony elements. 4. Left 9th posterior rib subacute fracture with callus. 5. No acute process of abdomen or pelvis identified. 6. The 16 mm enhancing focus in the right lobe of liver was not definitely present on prior studies and is of uncertain significance. Consider follow-up CT or MRI in 6 months if clinically indicated. 7. Stable left lumbar triangle fat containing hernia. 8. Stable dilatation of the pancreatic duct in the pancreatic tail suggestive of slow growing or benign etiology such as IPMN. 9. Sigmoid diverticulosis without evidence of diverticulitis. 10. Aortic atherosclerosis. Electronically Signed   By: Mitzi HansenLance  Furusawa-Stratton M.D.   On: 02/15/2016 21:31   Ct Tibia Fibula Left W Contrast  Result Date: 01/28/2016 CLINICAL DATA:  Lower leg contusion 1 week ago with hematoma involving the right lower extremity. The hematoma is draining and there is surrounding cellulitis. EXAM: CT OF THE LEFT TIBIA AND FIBULA WITH CONTRAST TECHNIQUE: Standard CT imaging of the left lower extremity was performed with coronal and sagittal reformatted images. CONTRAST:  75mL ISOVUE-300 IOPAMIDOL (ISOVUE-300) INJECTION 61% COMPARISON:  Radiographs 01/17/2016 FINDINGS: There is a the cutaneous lesion which demonstrates high attenuation consistent with hematoma. It has the appearance of a large blister on the scan. No subcutaneous hematoma. Mild surrounding subcutaneous edema or inflammation below the lesion  which could suggest cellulitis. No drainable soft tissue abscess. No findings for myofasciitis or pyomyositis. The knee and ankle joints are maintained. No findings to suggest  septic arthritis. No destructive bony changes to suggest osteomyelitis. Extensive vascular calcifications are noted. IMPRESSION: Cutaneous complex fluid collection has the appearance of a large blister. It measures 7.7 x 3.8 x 1.0 cm. No significant surrounding cellulitis or subcutaneous abscess. No findings for myofasciitis or pyomyositis. No findings for septic arthritis or osteomyelitis. Electronically Signed   By: Rudie Meyer M.D.   On: 01/28/2016 16:14    Time Spent in minutes  30   Rue Tinnel K M.D on 02/17/2016 at 10:29 AM  Between 7am to 7pm - Pager - [phone]  After 7pm go to [email or web address] - password [facility]  [facility] -  Office  [phone]

## 2016-02-18 ENCOUNTER — Observation Stay (HOSPITAL_BASED_OUTPATIENT_CLINIC_OR_DEPARTMENT_OTHER): Payer: Medicare Other

## 2016-02-18 ENCOUNTER — Other Ambulatory Visit (HOSPITAL_COMMUNITY): Payer: Self-pay

## 2016-02-18 DIAGNOSIS — Z515 Encounter for palliative care: Secondary | ICD-10-CM

## 2016-02-18 DIAGNOSIS — R1013 Epigastric pain: Secondary | ICD-10-CM | POA: Diagnosis not present

## 2016-02-18 DIAGNOSIS — Z7189 Other specified counseling: Secondary | ICD-10-CM | POA: Diagnosis not present

## 2016-02-18 DIAGNOSIS — I4891 Unspecified atrial fibrillation: Secondary | ICD-10-CM | POA: Diagnosis not present

## 2016-02-18 LAB — BASIC METABOLIC PANEL
Anion gap: 7 (ref 5–15)
BUN: 17 mg/dL (ref 6–20)
CALCIUM: 8.5 mg/dL — AB (ref 8.9–10.3)
CO2: 27 mmol/L (ref 22–32)
CREATININE: 1.07 mg/dL — AB (ref 0.44–1.00)
Chloride: 93 mmol/L — ABNORMAL LOW (ref 101–111)
GFR calc non Af Amer: 42 mL/min — ABNORMAL LOW (ref >60)
GFR, EST AFRICAN AMERICAN: 48 mL/min — AB (ref >60)
Glucose, Bld: 91 mg/dL (ref 65–99)
Potassium: 4.2 mmol/L (ref 3.5–5.1)
SODIUM: 127 mmol/L — AB (ref 135–145)

## 2016-02-18 LAB — OSMOLALITY, URINE: Osmolality, Ur: 521 mOsm/kg (ref 300–900)

## 2016-02-18 LAB — SODIUM, URINE, RANDOM: Sodium, Ur: 22 mmol/L

## 2016-02-18 MED ORDER — SODIUM CHLORIDE 0.9 % IV SOLN
INTRAVENOUS | Status: AC
  Administered 2016-02-18: 11:00:00 via INTRAVENOUS

## 2016-02-18 MED ORDER — CARVEDILOL 6.25 MG PO TABS
6.2500 mg | ORAL_TABLET | Freq: Two times a day (BID) | ORAL | Status: DC
  Administered 2016-02-18 – 2016-02-19 (×2): 6.25 mg via ORAL
  Filled 2016-02-18 (×2): qty 1

## 2016-02-18 NOTE — Clinical Social Work Note (Signed)
Bed offers provided to the patient's daughter Tamera PuntMiranda at bedside. Daughter is requesting a palliative care consult. CSW has requested this of MD. Daughter states that she can be reach at [phone] to schedule palliative meeting.   Roddie McBryant Josejulian Tarango MSW, [place]CS[place], [place], [phone]

## 2016-02-18 NOTE — Progress Notes (Signed)
PROGRESS NOTE                                                                                                                                                                                                             Patient Demographics:    [name], is a 82 y.o. female, DOB - 02/26/1915, EAV:[id number]  Admit date - 02/15/2016   Admitting Physician Michael LitterNikki Carter, MD  Outpatient Primary MD for the patient is Anne CorwinMCKEOWN,Anne DAVID, MD  LOS - 0  Chief Complaint  Patient presents with  . Chest Pain       Brief Narrative    [name] is a 82 y.o. woman with a history of TIA/CVA, chronic atrial fibrillation (CHADS-Vasc score of at least 4, no long term anticoagulation due to fall risk), HTN, hypothyroidism, and vitamin D deficiency who presents with her daughter [name] (who is also her POA) for evaluation of epigastric pain, acute onset after having a choking episode three days ago.    Gastric pain is much better on GI cocktail and PPI, CT abdomen and pelvis was nonacute, she has been seen by Dr. Lajoyce Cornersuda for her lower extremity wound and deemed clear from his standpoint for outpatient follow-up, for her falls she is orthostatic negative, we are waiting repeat echocardiogram which is still pending.    Subjective:    Anne [name] today has, No headache, No chest pain, Much improved epigastric abdominal pain - No Nausea, No new weakness tingling or numbness, No Cough - SOB.     Assessment  & Plan :     1.Mild epigastric pain nonspecific. CT scan abdomen and pelvis nonacute, lipase stable, she could have mild gastritis, much improved after GI cocktail & PPI will continue,outpatient GI follow-up. Due to her age we will try to avoid any invasive procedures until absolutely needed.  2. Choking episode. Currently stable, no cough or shortness of breath, speech following on D3 diet.  3. Left leg ulcer. Recently finished  treatment for cellulitis, I do not think it's clinically infected at this time, seen by Dr Lajoyce Cornersuda here, continue medicated sock follow in the office.  4. Acute on chronic mild diastolic CHF. Last EF 60%. Repeat echo is still pending, BNP was elevated, post gentle Lasix and monitor.  5. Fall at home with subacute T7 fracture, subacute left-sided ninth rib  fracture. No weakness in lower extremities, no acute back pain, no radiation of pain into her legs, supportive care with lidocaine patch. PT evaluation, question placement.  6. Anemia of chronic disease. Stable no acute issues.  7. Hyponatremia. Appears dehydrated with urine sodium around 20, hydrate with IV fluids on 02/18/2016 and monitor BMP.  8. Possible near syncope at home. Negative orthostatics and check echocardiogram.  9. Paroxysmal atrial fibrillation [place] vasc 2 score of at least 4. High fall risk therefore not on anticoagulation, will place her on low-dose beta blocker  10. Hypertension. Continue home dose ACE inhibitor, and low-dose beta blocker and monitor.  11. Hypothyroidism. Continue on Synthroid. Recent TSH was 2.4.  12. History of CVA in the past. On aspirin and continue.    Family Communication  :  None present  Code Status :  DNR  Diet : Heart Healthy  Disposition Plan  :  Home 1-2 days  Consults  :  W Care, Dr Lajoyce Corners   Procedures  :    TTE -    CT Abd Pelvis -   IMPRESSION: 1. Interstitial pulmonary edema. Moderate right pleural effusion. Small left pleural effusion. 2. Cardiomegaly, predominantly right atrial and ventricular. Coronary artery calcifications. 3. Moderate T7 compression deformity. No significant retropulsion of bony elements. 4. Left 9th posterior rib subacute fracture with callus. 5. No acute process of abdomen or pelvis identified. 6. The 16 mm enhancing focus in the right lobe of liver was not definitely present on prior studies and is of uncertain significance. Consider follow-up CT or  MRI in 6 months if clinicallyindicated. 7. Stable left lumbar triangle fat containing hernia. 8. Stable dilatation of the pancreatic duct in the pancreatic tail suggestive of slow growing or benign etiology such as IPMN. 9. Sigmoid diverticulosis without evidence of diverticulitis. 10. Aortic atherosclerosis.    DVT Prophylaxis  :  Lovenox    Lab Results  Component Value Date   PLT 362 02/16/2016    Inpatient Medications  Scheduled Meds: . aspirin EC  81 mg Oral QHS  . carvedilol  6.25 mg Oral BID WC  . DULoxetine  60 mg Oral Daily  . enoxaparin (LOVENOX) injection  30 mg Subcutaneous Q24H  . famotidine  20 mg Oral Q24H  . gabapentin  100 mg Oral BID  . gi cocktail  30 mL Oral TID  . levothyroxine  75 mcg Oral QAC breakfast  . lidocaine  1 patch Transdermal Q24H  . lisinopril  5 mg Oral Daily  . pantoprazole  40 mg Oral Daily   Continuous Infusions: . sodium chloride     PRN Meds:.acetaminophen, docusate sodium, ondansetron (ZOFRAN) IV, simethicone, traMADol  Antibiotics  :    Anti-infectives    None         Objective:   Vitals:   02/17/16 1820 02/17/16 2100 02/18/16 0500 02/18/16 0821  BP:  (!) 121/44 120/62 (!) 152/68  Pulse: 69 64 61 73  Resp: 17 18 15    Temp:  97.9 F (36.6 C) 97.6 F (36.4 C)   TempSrc:      SpO2: 94% 92% 94%   Weight:   53.2 kg (117 lb 4.8 oz)   Height:        Wt Readings from Last 3 Encounters:  02/18/16 53.2 kg (117 lb 4.8 oz)  02/06/16 52.8 kg (116 lb 6.4 oz)  01/21/16 49 kg (108 lb)     Intake/Output Summary (Last 24 hours) at 02/18/16 1610 Last data filed at  02/18/16 0847  Gross per 24 hour  Intake               60 ml  Output              200 ml  Net             -140 ml     Physical Exam  Awake Alert, Oriented X 3, No new F.N deficits, Normal affect [place].AT,PERRAL, blind in left eye Supple Neck,No JVD, No cervical lymphadenopathy appriciated.  Symmetrical Chest wall movement, Good air movement bilaterally,  CTAB RRR,No Gallops,Rubs or new Murmurs, No Parasternal Heave +ve B.Sounds, Abd Soft, No tenderness, No organomegaly appriciated, No rebound - guarding or rigidity. No Cyanosis, Clubbing or edema, No new Rash or bruise, L leg ulcer under bandage some Chronic surrounding discoloration without any warmth    Data Review:    CBC  Recent Labs Lab 02/15/16 1445 02/16/16 0120  WBC 10.7* 9.5  HGB 10.5* 10.1*  HCT 33.0* 30.7*  PLT 387 362  MCV 95.4 94.5  MCH 30.3 31.1  MCHC 31.8 32.9  RDW 14.5 14.2    Chemistries   Recent Labs Lab 02/15/16 1445 02/16/16 0120 02/17/16 0407 02/18/16 0517  NA 127* 128* 129* 127*  K 4.6 4.4 4.2 4.2  CL 93* 93* 94* 93*  CO2 24 25 27 27   GLUCOSE 131* 94 100* 91  BUN 12 10 14 17   CREATININE 0.86 0.81 1.01* 1.07*  CALCIUM 9.1 8.9 8.7* 8.5*  AST 23  --   --   --   ALT 12*  --   --   --   ALKPHOS 57  --   --   --   BILITOT 1.0  --   --   --    ------------------------------------------------------------------------------------------------------------------ No results for input(s): CHOL, HDL, LDLCALC, TRIG, CHOLHDL, LDLDIRECT in the last 72 hours.  Lab Results  Component Value Date   HGBA1C 5.6 08/13/2015   ------------------------------------------------------------------------------------------------------------------ No results for input(s): TSH, T4TOTAL, T3FREE, THYROIDAB in the last 72 hours.  Invalid input(s): FREET3 ------------------------------------------------------------------------------------------------------------------ No results for input(s): VITAMINB12, FOLATE, FERRITIN, TIBC, IRON, RETICCTPCT in the last 72 hours.  Coagulation profile No results for input(s): INR, PROTIME in the last 168 hours.  No results for input(s): DDIMER in the last 72 hours.  Cardiac Enzymes  Recent Labs Lab 02/15/16 2210 02/16/16 0120 02/16/16 0421  TROPONINI <0.03 <0.03 0.03*    ------------------------------------------------------------------------------------------------------------------    Component Value Date/Time   BNP 339.4 (H) 02/16/2016 0644   BNP 146.3 (H) 06/06/2011 1506    Micro Results No results found for this or any previous visit (from the past 240 hour(s)).  Radiology Reports Dg Chest 2 View  Result Date: 02/15/2016 CLINICAL DATA:  Chest pain, mid chest region. Recent fall. Cough and shortness of breath. EXAM: CHEST  2 VIEW COMPARISON:  January 06, 2016 and January 04, 2016 FINDINGS: There are small pleural effusions bilaterally with patchy interstitial edema in the bases. The lungs elsewhere are clear. Heart is mildly enlarged with pulmonary vascularity within normal limits. There is calcification throughout the aorta. No adenopathy. Bones are somewhat osteoporotic. There is anterior wedging of a mid thoracic vertebral body. No other fracture evident. No pneumothorax. IMPRESSION: Evidence of a degree of congestive heart failure. No appreciable airspace consolidation. There is aortic atherosclerosis. Bones osteoporotic with anterior wedge compression fracture in mid thoracic region. This fracture was not present 6 weeks prior and may well be related to recent apparent fall.  Electronically Signed   By: Bretta Bang III M.D.   On: 02/15/2016 15:17   Ct Chest W Contrast  Result Date: 02/15/2016 CLINICAL DATA:  82 y/o F; chest pain with history of pneumonia and hypertension. Recent falls with probable compression fracture on chest radiograph. EXAM: CT CHEST WITH CONTRAST CT ABDOMEN AND PELVIS WITH CONTRAST TECHNIQUE: Multidetector CT imaging of the chest was performed during intravenous contrast administration. Multidetector CT imaging of the abdomen and pelvis was performed following the standard protocol during bolus administration of intravenous contrast. CONTRAST:  ISOVUE-300 IOPAMIDOL (ISOVUE-300) INJECTION 61% COMPARISON:  02/15/2016 chest  radiograph. CT abdomen and pelvis 03/23/2014. CT chest 02/15/2010 FINDINGS: CT CHEST FINDINGS Cardiovascular: Right atrial and right ventricular enlargement. Coronary artery calcification. Normal size main pulmonary artery and thoracic aorta. Aortic atherosclerosis with moderate calcifications. Mediastinum/Nodes: No enlarged mediastinal, hilar, or axillary lymph nodes. Thyroid gland, trachea, and esophagus demonstrate no significant findings. Lungs/Pleura: Biapical scarring with calcifications is stable. 4 mm nodule in the middle lobe along the minor fissure is stable. Linear opacities in lung bases are compatible with minor atelectasis. Mosaic attenuation of lung parenchyma and interlobular septal thickening consistent with mild pulmonary edema. Scattered 2-3 mm nodules in clusters probably represents a minimal bronchiolitis. Diffuse peribronchial thickening. Moderate right and small left pleural effusions. All Musculoskeletal: T7 moderate compression deformity. Chronic right third lateral and fourth lateral rib fractures. Left ninth posterior subacute rib fracture with callus. T9 vertebral body lucency with prominent vertical trabeculation compatible with hemangioma. CT ABDOMEN AND PELVIS FINDINGS Hepatobiliary: The segment 5, 16 mm enhancing focus (series 3, image 56). No other focal lesion liver lesion is identified. Density along posterior wall gallbladder may represent a polyp or stone and is stable. No biliary ductal dilatation. Pancreas: Persistent stable dilatation of the pancreatic duct in the tail with atrophy. Otherwise the pancreas is unremarkable. Spleen: Normal in size without focal abnormality. Adrenals/Urinary Tract: Right kidney stable lower pole 11 mm cyst. Normal adrenal glands. No renal stone or obstructive uropathy is identified. Normal bladder. Stomach/Bowel: Extensive sigmoid diverticulosis without evidence of diverticulitis. No inflammatory or obstructive changes of the bowel. Normal  appendix. Vascular/Lymphatic: Aortic atherosclerosis. No enlarged abdominal or pelvic lymph nodes. Other: Stable left lumbar triangle fat containing hernia. Musculoskeletal: Degenerative changes of the lumbar spine with disc space narrowing greatest at L4-5 and L5-S1. No acute osseous abnormality is identified. IMPRESSION: 1. Interstitial pulmonary edema. Moderate right pleural effusion. Small left pleural effusion. 2. Cardiomegaly, predominantly right atrial and ventricular. Coronary artery calcifications. 3. Moderate T7 compression deformity. No significant retropulsion of bony elements. 4. Left 9th posterior rib subacute fracture with callus. 5. No acute process of abdomen or pelvis identified. 6. The 16 mm enhancing focus in the right lobe of liver was not definitely present on prior studies and is of uncertain significance. Consider follow-up CT or MRI in 6 months if clinically indicated. 7. Stable left lumbar triangle fat containing hernia. 8. Stable dilatation of the pancreatic duct in the pancreatic tail suggestive of slow growing or benign etiology such as IPMN. 9. Sigmoid diverticulosis without evidence of diverticulitis. 10. Aortic atherosclerosis. Electronically Signed   By: Mitzi Hansen M.D.   On: 02/15/2016 21:31   Ct Abdomen Pelvis W Contrast  Result Date: 02/15/2016 CLINICAL DATA:  82 y/o F; chest pain with history of pneumonia and hypertension. Recent falls with probable compression fracture on chest radiograph. EXAM: CT CHEST WITH CONTRAST CT ABDOMEN AND PELVIS WITH CONTRAST TECHNIQUE: Multidetector CT imaging of the chest  was performed during intravenous contrast administration. Multidetector CT imaging of the abdomen and pelvis was performed following the standard protocol during bolus administration of intravenous contrast. CONTRAST:  ISOVUE-300 IOPAMIDOL (ISOVUE-300) INJECTION 61% COMPARISON:  02/15/2016 chest radiograph. CT abdomen and pelvis 03/23/2014. CT chest  02/15/2010 FINDINGS: CT CHEST FINDINGS Cardiovascular: Right atrial and right ventricular enlargement. Coronary artery calcification. Normal size main pulmonary artery and thoracic aorta. Aortic atherosclerosis with moderate calcifications. Mediastinum/Nodes: No enlarged mediastinal, hilar, or axillary lymph nodes. Thyroid gland, trachea, and esophagus demonstrate no significant findings. Lungs/Pleura: Biapical scarring with calcifications is stable. 4 mm nodule in the middle lobe along the minor fissure is stable. Linear opacities in lung bases are compatible with minor atelectasis. Mosaic attenuation of lung parenchyma and interlobular septal thickening consistent with mild pulmonary edema. Scattered 2-3 mm nodules in clusters probably represents a minimal bronchiolitis. Diffuse peribronchial thickening. Moderate right and small left pleural effusions. All Musculoskeletal: T7 moderate compression deformity. Chronic right third lateral and fourth lateral rib fractures. Left ninth posterior subacute rib fracture with callus. T9 vertebral body lucency with prominent vertical trabeculation compatible with hemangioma. CT ABDOMEN AND PELVIS FINDINGS Hepatobiliary: The segment 5, 16 mm enhancing focus (series 3, image 56). No other focal lesion liver lesion is identified. Density along posterior wall gallbladder may represent a polyp or stone and is stable. No biliary ductal dilatation. Pancreas: Persistent stable dilatation of the pancreatic duct in the tail with atrophy. Otherwise the pancreas is unremarkable. Spleen: Normal in size without focal abnormality. Adrenals/Urinary Tract: Right kidney stable lower pole 11 mm cyst. Normal adrenal glands. No renal stone or obstructive uropathy is identified. Normal bladder. Stomach/Bowel: Extensive sigmoid diverticulosis without evidence of diverticulitis. No inflammatory or obstructive changes of the bowel. Normal appendix. Vascular/Lymphatic: Aortic atherosclerosis. No  enlarged abdominal or pelvic lymph nodes. Other: Stable left lumbar triangle fat containing hernia. Musculoskeletal: Degenerative changes of the lumbar spine with disc space narrowing greatest at L4-5 and L5-S1. No acute osseous abnormality is identified. IMPRESSION: 1. Interstitial pulmonary edema. Moderate right pleural effusion. Small left pleural effusion. 2. Cardiomegaly, predominantly right atrial and ventricular. Coronary artery calcifications. 3. Moderate T7 compression deformity. No significant retropulsion of bony elements. 4. Left 9th posterior rib subacute fracture with callus. 5. No acute process of abdomen or pelvis identified. 6. The 16 mm enhancing focus in the right lobe of liver was not definitely present on prior studies and is of uncertain significance. Consider follow-up CT or MRI in 6 months if clinically indicated. 7. Stable left lumbar triangle fat containing hernia. 8. Stable dilatation of the pancreatic duct in the pancreatic tail suggestive of slow growing or benign etiology such as IPMN. 9. Sigmoid diverticulosis without evidence of diverticulitis. 10. Aortic atherosclerosis. Electronically Signed   By: Mitzi Hansen M.D.   On: 02/15/2016 21:31   Ct Tibia Fibula Left W Contrast  Result Date: 01/28/2016 CLINICAL DATA:  Lower leg contusion 1 week ago with hematoma involving the right lower extremity. The hematoma is draining and there is surrounding cellulitis. EXAM: CT OF THE LEFT TIBIA AND FIBULA WITH CONTRAST TECHNIQUE: Standard CT imaging of the left lower extremity was performed with coronal and sagittal reformatted images. CONTRAST:  75mL ISOVUE-300 IOPAMIDOL (ISOVUE-300) INJECTION 61% COMPARISON:  Radiographs 01/17/2016 FINDINGS: There is a the cutaneous lesion which demonstrates high attenuation consistent with hematoma. It has the appearance of a large blister on the scan. No subcutaneous hematoma. Mild surrounding subcutaneous edema or inflammation below the lesion  which could suggest cellulitis.  No drainable soft tissue abscess. No findings for myofasciitis or pyomyositis. The knee and ankle joints are maintained. No findings to suggest septic arthritis. No destructive bony changes to suggest osteomyelitis. Extensive vascular calcifications are noted. IMPRESSION: Cutaneous complex fluid collection has the appearance of a large blister. It measures 7.7 x 3.8 x 1.0 cm. No significant surrounding cellulitis or subcutaneous abscess. No findings for myofasciitis or pyomyositis. No findings for septic arthritis or osteomyelitis. Electronically Signed   By: Rudie Meyer M.D.   On: 01/28/2016 16:14    Time Spent in minutes  30   SINGH,PRASHANT K M.D on 02/18/2016 at 9:58 AM  Between 7am to 7pm - Pager - [phone]  After 7pm go to [email or web address] - password [facility]  [facility] -  Office  [phone]

## 2016-02-18 NOTE — Consult Note (Addendum)
WOC consult requested for left leg wound prior to ortho service involvement.  Dr Lajoyce Cornersuda is now following for assessment and plan of care; refer to progress note on 9/23.  He has ordered topical treatment as follows: "Patient's daughter will wash the leg with soap and water applied the [employer] medical compression sock, they will change this daily. There is no need for any wound ointment or wound dressings the sock is to be worn directly against the wound. I will follow-up in the office." Please refer to ortho team for further questions, and re-consult if further assistance is needed.  Thank-you,  Cammie Mcgeeawn Aramis Weil MSN, RN, CWOCN, [place]-AP, CNS [phone]

## 2016-02-18 NOTE — Consult Note (Signed)
Consultation Note Date: 02/18/2016   Patient Name: [name]  DOB: 1915/01/07  MRN: [id number]  Age / Sex: 82 y.o., female  PCP: Unk Pinto, MD Referring Physician: Thurnell Lose, MD  Reason for Consultation: Hospice Evaluation and Psychosocial/spiritual support  HPI/Patient Profile: 81 y.o. female  with past medical history of afib, CVD, frequent falls, and recent LLE cellulitis who was admitted on 02/15/2016 with epigastric pain, and hyponatremia. [name] has had 3 admissions in the last 6 months.  She lost her son to lymphoma granulamatosis last December and since that time she has declined quickly.  Her daughter [name] is now her HCPOA and primary care taker.  Clinical Assessment and Goals of Care: I met with the patient and [name] at bedside.  [name] mentions that her mother is an [facility] patient and that [name] (the [facility] rep) recommended the inpatient consult.  [name] described a steep decline in her mother's functionality in the last 6 months.  She walks less and sleeps more. [name] questions whether or not she will walk again after this hospitalization.  She eats very little.  She has been having episodes of confusion.  She is dependent on [name] for assistance with all of her ADLs.  She has had an increase in sudden falls.    We discussed the rib fracture and compression fractures that may be contributing to her pain.  We discussed the abnormality of her pancreatic duct and the 16 mm area of focus on her liver, and the vascular congestion evident on imaging during this hospitalization.  Her mother is becoming more frail.  [name] has considered SNF for acute rehab.  She doubts her mother could perform in rehab, and she believes she wants to go home rather than to another facility from the hospital.  [name] and her daughter live with [name].  They feel they can handle  her needs with the help of Hospice in the home.  We discussed the use of opiate pain medications.  In the past they have quickly caused severe constipation. If morphine or vicodin were to be started - scheduled senna/s would need to be started as well.  HCPOA:  [name]    SUMMARY OF RECOMMENDATIONS    When ready please D/C to home with [facility].  [name] would like to speak to Hospice prior to D/C  Recommend D/C on tramadol PRN with Senna/S to be given daily when using tramadol.   Code Status/Advance Care Planning:  DNR    Symptom Management:   As above  Prognosis:   < 6 months in the setting of steep functional decline, recurrent hospitalizations, advanced age, immobility, increased confusion and decreased PO intake.  Discharge Planning: Home with Hospice      Primary Diagnoses: Present on Admission: . Chest pain . Atrial fibrillation ([place]) . Hypothyroidism . Essential hypertension . Vitamin D deficiency . Syncope and collapse . Cellulitis of left lower extremity . Hyponatremia   I have reviewed the medical record, interviewed the patient and  family, and examined the patient. The following aspects are pertinent.  Past Medical History:  Diagnosis Date  . Atrial fibrillation ([place])    not a candidate for coumadin due to falls  . AV block, 1st degree   . CVD (cerebrovascular disease)    History of CVA  . GERD (gastroesophageal reflux disease)   . History of echocardiogram 9/11   normal EF, mild to mod MR & TR, mod pulmonary HTN  . Hypertension   . Hypothyroidism   . Osteoarthritis   . Pneumonia 09/11  . Prediabetes   . Syncope and collapse    Negative loop recorder in the past  . Traumatic enucleation of eye    left eye   Social History   Social History  . Marital status: Single    Spouse name: N/A  . Number of children: N/A  . Years of education: N/A   Social History Main Topics  . Smoking status: Never Smoker  . Smokeless  tobacco: Never Used  . Alcohol use No  . Drug use: No  . Sexual activity: No   Other Topics Concern  . None   Social History Narrative  . None   Family History  Problem Relation Age of Onset  . Heart attack Father   . Cancer Brother   . Stroke Brother   . Heart attack Brother    Scheduled Meds: . aspirin EC  81 mg Oral QHS  . carvedilol  6.25 mg Oral BID WC  . DULoxetine  60 mg Oral Daily  . enoxaparin (LOVENOX) injection  30 mg Subcutaneous Q24H  . famotidine  20 mg Oral Q24H  . gabapentin  100 mg Oral BID  . gi cocktail  30 mL Oral TID  . levothyroxine  75 mcg Oral QAC breakfast  . lidocaine  1 patch Transdermal Q24H  . lisinopril  5 mg Oral Daily  . pantoprazole  40 mg Oral Daily   Continuous Infusions: . sodium chloride 100 mL/hr at 02/18/16 1056   PRN Meds:.acetaminophen, docusate sodium, ondansetron (ZOFRAN) IV, simethicone, traMADol Medications Prior to Admission:  Prior to Admission medications   Medication Sig Start Date End Date Taking? Authorizing Provider  acetaminophen (TYLENOL) 325 MG tablet Take 325-650 mg by mouth See admin instructions. 325 mg when Tramadol is taken; 650 mg as needed for headache or pain and NO Tramadol   Yes Historical Provider, MD  aspirin 81 MG tablet Take 81 mg by mouth at bedtime.    Yes Historical Provider, MD  beta carotene w/minerals (OCUVITE) tablet Take 1 tablet by mouth 2 (two) times daily.   Yes Historical Provider, MD  Cholecalciferol (VITAMIN D3) 1000 UNITS CAPS Take 1,000 Units by mouth 2 (two) times daily.    Yes Historical Provider, MD  docusate sodium (COLACE) 100 MG capsule Take 100 mg by mouth 2 (two) times daily as needed for mild constipation.  01/06/16  Yes Historical Provider, MD  DULoxetine (CYMBALTA) 60 MG capsule Take 1 capsule (60 mg total) by mouth daily. 02/06/16  Yes Unk Pinto, MD  gabapentin (NEURONTIN) 100 MG capsule Take 1 capsule (100 mg total) by mouth 2 (two) times daily. 02/01/16  Yes Courtney  Forcucci, PA-C  lisinopril (PRINIVIL,ZESTRIL) 5 MG tablet Take 1 tablet (5 mg total) by mouth daily. 09/12/15  Yes Burtis Junes, NP  MAGNESIUM PO Take 1 tablet by mouth daily.   Yes Historical Provider, MD  ondansetron (ZOFRAN) 4 MG tablet Take 1 tablet (4 mg total) by mouth  every 6 (six) hours. Patient taking differently: Take 4 mg by mouth every 6 (six) hours as needed for nausea.  06/28/14  Yes Quintella Reichert, MD  SYNTHROID 75 MCG tablet TAKE 1 TABLET DAILY BEFORE BREAKFAST Patient taking differently: TAKE 1 TABLET (75 mcg) DAILY BEFORE BREAKFAST 10/09/15  Yes Vicie Mutters, PA-C  traMADol (ULTRAM) 50 MG tablet TAKE 1 TABLET BY MOUTH 3-4 TIMES PER DAY AS NEEDED FOR PAIN Patient taking differently: TAKE 1 TABLET (50 mg) BY MOUTH twice daily AS NEEDED FOR PAIN 02/14/16  Yes Vicie Mutters, PA-C   Allergies  Allergen Reactions  . Augmentin [Amoxicillin-Pot Clavulanate] Diarrhea  . Ciprocin-Fluocin-Procin [Fluocinolone Acetonide] Nausea And Vomiting  . Ciprofloxacin Hcl Nausea And Vomiting  . Codeine Hives and Nausea And Vomiting  . Sulfa Drugs Cross Reactors Hives and Nausea And Vomiting   Review of Systems:  Patient is pleasantly demented and unable to answer  Physical Exam  Thin, frail, HOH, artificial left eye CV irreg irreg Resp slightly apneic, desats easily when sleeping,  NAD Abdomen thin, ND Extremities:  LLE in black wound care stocking.  Vital Signs: BP 136/65   Pulse 73   Temp 97.6 F (36.4 C)   Resp 15   Ht '5\' 2"'  (1.575 m)   Wt 53.2 kg (117 lb 4.8 oz)   SpO2 94%   BMI 21.45 kg/m  Pain Assessment: No/denies pain   Pain Score: 0-No pain   SpO2: SpO2: 94 % O2 Device:SpO2: 94 % O2 Flow Rate: .O2 Flow Rate (L/min): 1 L/min  IO: Intake/output summary:   Intake/Output Summary (Last 24 hours) at 02/18/16 1622 Last data filed at 02/18/16 1300  Gross per 24 hour  Intake              180 ml  Output              200 ml  Net              -20 ml    LBM: Last BM  Date: 02/17/16 Baseline Weight: Weight: 54.5 kg (120 lb 3.2 oz) Most recent weight: Weight: 53.2 kg (117 lb 4.8 oz)     Palliative Assessment/Data:   Flowsheet Rows   Flowsheet Row Most Recent Value  Intake Tab  Referral Department  Hospitalist  Unit at Time of Referral  Cardiac/Telemetry Unit  Palliative Care Primary Diagnosis  Other (Comment)  Date Notified  02/18/16  Palliative Care Type  New Palliative care  Reason for referral  Counsel Regarding Hospice, Clarify Goals of Care, Psychosocial or Spiritual support, Other (Comment)  Date of Admission  02/15/16  Date first seen by Palliative Care  02/18/16  # of days Palliative referral response time  0 Day(s)  # of days IP prior to Palliative referral  3  Clinical Assessment  Palliative Performance Scale Score  30%  Pain Min Last 24 hours  9  Psychosocial & Spiritual Assessment  Palliative Care Outcomes  Patient/Family meeting held?  Yes  Who was at the meeting?  patient and dtr [name])  Palliative Care Outcomes  Counseled regarding hospice, Clarified goals of care, Improved non-pain symptom therapy      Time In: 3:00 Time Out: 4:10 Time Total: 70 Greater than 50%  of this time was spent counseling and coordinating care related to the above assessment and plan.  Signed by: Melton Alar, PA-C   Please contact Palliative Medicine Team phone at [phone] for questions and concerns.  For individual provider: See Shea Evans

## 2016-02-18 NOTE — Progress Notes (Signed)
  Echocardiogram 2D Echocardiogram has been performed.  [name], [name] M 02/18/2016, 10:13 AM

## 2016-02-18 NOTE — Progress Notes (Signed)
Speech Language Pathology Treatment: Dysphagia  Patient Details Name: [name] MRN: [id number] DOB: 07/15/1914 Today's Date: 02/18/2016 Time: [id number] SLP Time Calculation (min) (ACUTE ONLY): 17 min  Assessment / Plan / Recommendation Clinical Impression  Pt was repositioned and consumed PO trials from her lunch meal tray. Given Dys 3 textures, puree solids, and thin liquids pt displayed no overt s/s of aspiration at bedside. She showed prolonged mastication of solids, but given her dentition this is to be expected. Pt needed thin liquid sips to follow solid bites to achieve oral clearance. Minimal visual and verbal cueing was needed for the pt to follow 1 step commands. Recommend continuation of Dys 3 diet and thin liquid. SLP will s/o.   HPI HPI: 82 y.o. female admitted with traumatic venous stasis ulcer L leg. Pt has had multiple falls recently falling backwards.      SLP Plan  Continue with current plan of care     Recommendations  Diet recommendations: Dysphagia 3 (mechanical soft);Thin liquid Liquids provided via: Straw;Cup Medication Administration: Whole meds with puree Supervision: Full supervision/cueing for compensatory strategies;Staff to assist with self feeding Compensations: Minimize environmental distractions;Slow rate;Small sips/bites;Follow solids with liquid Postural Changes and/or Swallow Maneuvers: Seated upright 90 degrees;Upright 30-60 min after meal                Oral Care Recommendations: Oral care BID Follow up Recommendations: None Plan: Continue with current plan of care       GO               Tollie EthHaleigh Ragan Morrissa Shein, Student SLP  Caryl NeverHaleigh R Courney Garrod 02/18/2016, 1:56 PM

## 2016-02-19 ENCOUNTER — Other Ambulatory Visit: Payer: Self-pay | Admitting: *Deleted

## 2016-02-19 ENCOUNTER — Telehealth: Payer: Self-pay | Admitting: *Deleted

## 2016-02-19 DIAGNOSIS — R1084 Generalized abdominal pain: Secondary | ICD-10-CM | POA: Diagnosis not present

## 2016-02-19 DIAGNOSIS — I6789 Other cerebrovascular disease: Secondary | ICD-10-CM | POA: Diagnosis not present

## 2016-02-19 DIAGNOSIS — R1013 Epigastric pain: Secondary | ICD-10-CM | POA: Diagnosis not present

## 2016-02-19 LAB — ECHOCARDIOGRAM COMPLETE
HEIGHTINCHES: 62 in
WEIGHTICAEL: 1876.8 [oz_av]

## 2016-02-19 LAB — BASIC METABOLIC PANEL
Anion gap: 7 (ref 5–15)
BUN: 18 mg/dL (ref 6–20)
CALCIUM: 8.3 mg/dL — AB (ref 8.9–10.3)
CO2: 25 mmol/L (ref 22–32)
CREATININE: 0.99 mg/dL (ref 0.44–1.00)
Chloride: 97 mmol/L — ABNORMAL LOW (ref 101–111)
GFR, EST AFRICAN AMERICAN: 53 mL/min — AB (ref >60)
GFR, EST NON AFRICAN AMERICAN: 46 mL/min — AB (ref >60)
GLUCOSE: 93 mg/dL (ref 65–99)
POTASSIUM: 4.2 mmol/L (ref 3.5–5.1)
SODIUM: 129 mmol/L — AB (ref 135–145)

## 2016-02-19 MED ORDER — LIDOCAINE 5 % EX PTCH: 1.0000 | MEDICATED_PATCH | Freq: Two times a day (BID) | CUTANEOUS | 5 refills | Status: AC

## 2016-02-19 MED ORDER — CARVEDILOL 6.25 MG PO TABS: 6.2500 mg | ORAL_TABLET | Freq: Two times a day (BID) | ORAL | 0 refills | Status: DC

## 2016-02-19 MED ORDER — SENNOSIDES-DOCUSATE SODIUM 8.6-50 MG PO TABS: 1.0000 | ORAL_TABLET | Freq: Every evening | ORAL | 0 refills | Status: AC | PRN

## 2016-02-19 MED ORDER — SIMETHICONE 80 MG PO CHEW: 80.0000 mg | CHEWABLE_TABLET | ORAL | 0 refills | Status: AC | PRN

## 2016-02-19 MED ORDER — SODIUM CHLORIDE 0.9 % IV BOLUS (SEPSIS)
500.0000 mL | Freq: Once | INTRAVENOUS | Status: AC
  Administered 2016-02-19: 500 mL via INTRAVENOUS

## 2016-02-19 MED ORDER — PANTOPRAZOLE SODIUM 40 MG PO TBEC: 40.0000 mg | DELAYED_RELEASE_TABLET | Freq: Every day | ORAL | 0 refills | Status: DC

## 2016-02-19 NOTE — Discharge Summary (Signed)
[name] ZOX:[id number] DOB: 1914/06/12 DOA: 02/15/2016  PCP: Nadean Corwin, MD  Admit date: 02/15/2016  Discharge date: 02/19/2016  Admitted From: home   Disposition:  Home hospice   Recommendations for Outpatient Follow-up:   Follow up with PCP in 1-2 weeks  PCP Please obtain BMP/CBC, 2 view CXR in 1week,  (see Discharge instructions)   PCP Please follow up on the following pending results: None   Home Health: None   Equipment/Devices: None  Consultations: Pall care Discharge Condition: Fair   CODE STATUS: DNR   Diet Recommendation: Dysphagia 3 with full feeding assistance and aspiration precautions   Chief Complaint  Patient presents with  . Chest Pain     Brief history of present illness from the day of admission and additional interim summary    Anne M Cochranis a 82 y.o.woman with a history of TIA/CVA, chronic atrial fibrillation (CHADS-Vasc score of at least 4, no long term anticoagulation due to fall risk), HTN, hypothyroidism, and vitamin D deficiency who presents with her daughter [name] (who is also her POA) for evaluation of epigastric pain, acute onset after having a choking episode three days ago.   Gastric pain is much better on GI cocktail and PPI, CT abdomen and pelvis was nonacute, she has been seen by Dr. Lajoyce Corners for her lower extremity wound and deemed clear from his standpoint for outpatient follow-up, for her falls she is orthostatic negative, her echocardiogram was stable, she was hydrated with IV fluids now back to baseline, however her baseline quality of care is not good and upon daughter's request palliative care was consulted, patient is now DO NOT RESUSCITATE, gentle medical treatment, will be discharged with home hospice.   Hospital issues addressed      1.Mild  epigastric pain nonspecific. CT scan abdomen and pelvis nonacute, lipase stable, She clinically had mild gastritis which resolved after GI cocktail and PPI, will be placed on PPI with outpatient PCP follow-up, now per daughter called of care is gentle medical treatment she is being discharged with home hospice.  2. Choking episode. Currently stable, no cough or shortness of breath, speech following on D3 diet.  3. Left leg ulcer. Recently finished treatment for cellulitis, I do not think it's clinically infected at this time, seen by Dr Lajoyce Corners here, continue medicated sock follow in the office.  4. Acute on chronic mild diastolic CHF. Last EF 60%. She was diuresed here and now stable, note repeat echo report is pending..  5. Fall at home with subacute T7 fracture, subacute left-sided ninth rib fracture. No weakness in lower extremities, no acute back pain, no radiation of pain into her legs, in resolve at home dose tramadol, she is symptom free.  6. Anemia of chronic disease. Stable no acute issues.  7. Hyponatremia. Due to dehydration after initial diuresis, stable after IV fluids, requested to stay hydrated.  8. Possible near syncope at home. Negative orthostatics , report from recent echocardiogram is pending, she is stable and now symptom-free.  9. Paroxysmal atrial fibrillation  [place] vasc 2 score of at least 4. High fall risk therefore not on anticoagulation, had placed on Coreg.  10. Hypertension. Continue Coreg per home dose.  11. Hypothyroidism. Continue on Synthroid. Recent TSH was 2.4.  12. History of CVA in the past. On aspirin and continue.    Discharge diagnosis     Principal Problem:   Epigastric pain Active Problems:   Essential hypertension   Hypothyroidism   Atrial fibrillation (HCC)   Vitamin D deficiency   Syncope and collapse   Cellulitis of left lower extremity   Hyponatremia   Chest pain   Pancreatic mass   Palliative care encounter   Goals of  care, counseling/discussion   Encounter for hospice care discussion    Discharge instructions    Discharge Instructions    Discharge instructions    Complete by:  As directed    Follow with Primary MD MCKEOWN,WILLIAM DAVID, MD in 7 days   Activity: As tolerated with Full fall precautions use walker/cane & assistance as needed  Disposition Home with hospice  Diet:   Dysphagia 3 diet with feeding assistance and aspiration precautions.  For Heart failure patients - Check your Weight same time everyday, if you gain over 2 pounds, or you develop in leg swelling, experience more shortness of breath or chest pain, call your Primary MD immediately. Follow Cardiac Low Salt Diet and 1.5 lit/day fluid restriction.   On your next visit with your primary care physician please Get Medicines reviewed and adjusted.   Please request your Prim.MD to go over all Hospital Tests and Procedure/Radiological results at the follow up, please get all Hospital records sent to your Prim MD by signing hospital release before you go home.   If you experience worsening of your admission symptoms, develop shortness of breath, life threatening emergency, suicidal or homicidal thoughts you must seek medical attention immediately by calling 911 or calling your MD immediately  if symptoms less severe.  You Must read complete instructions/literature along with all the possible adverse reactions/side effects for all the Medicines you take and that have been prescribed to you. Take any new Medicines after you have completely understood and accpet all the possible adverse reactions/side effects.   Do not drive, operate heavy machinery, perform activities at heights, swimming or participation in water activities or provide baby sitting services if your were admitted for syncope or siezures until you have seen by Primary MD or a Neurologist and advised to do so again.  Do not drive when taking Pain medications.    Do not  take more than prescribed Pain, Sleep and Anxiety Medications  Special Instructions: If you have smoked or chewed Tobacco  in the last 2 yrs please stop smoking, stop any regular Alcohol  and or any Recreational drug use.  Wear Seat belts while driving.   Please note  You were cared for by a hospitalist during your hospital stay. If you have any questions about your discharge medications or the care you received while you were in the hospital after you are discharged, you can call the unit and asked to speak with the hospitalist on call if the hospitalist that took care of you is not available. Once you are discharged, your primary care physician will handle any further medical issues. Please note that NO REFILLS for any discharge medications will be authorized once you are discharged, as it is imperative that you return to your primary care physician (or establish a relationship with a primary care  physician if you do not have one) for your aftercare needs so that they can reassess your need for medications and monitor your lab values.   Increase activity slowly    Complete by:  As directed       Discharge Medications     Medication List    STOP taking these medications   lisinopril 5 MG tablet Commonly known as:  PRINIVIL,ZESTRIL     TAKE these medications   acetaminophen 325 MG tablet Commonly known as:  TYLENOL Take 325-650 mg by mouth See admin instructions. 325 mg when Tramadol is taken; 650 mg as needed for headache or pain and NO Tramadol   aspirin 81 MG tablet Take 81 mg by mouth at bedtime.   beta carotene w/minerals tablet Take 1 tablet by mouth 2 (two) times daily.   carvedilol 6.25 MG tablet Commonly known as:  COREG Take 1 tablet (6.25 mg total) by mouth 2 (two) times daily with a meal.   docusate sodium 100 MG capsule Commonly known as:  COLACE Take 100 mg by mouth 2 (two) times daily as needed for mild constipation.   DULoxetine 60 MG capsule Commonly  known as:  CYMBALTA Take 1 capsule (60 mg total) by mouth daily.   gabapentin 100 MG capsule Commonly known as:  NEURONTIN Take 1 capsule (100 mg total) by mouth 2 (two) times daily.   MAGNESIUM PO Take 1 tablet by mouth daily.   ondansetron 4 MG tablet Commonly known as:  ZOFRAN Take 1 tablet (4 mg total) by mouth every 6 (six) hours. What changed:  when to take this  reasons to take this   pantoprazole 40 MG tablet Commonly known as:  PROTONIX Take 1 tablet (40 mg total) by mouth daily.   senna-docusate 8.6-50 MG tablet Commonly known as:  Senokot-S Take 1 tablet by mouth at bedtime as needed for mild constipation.   simethicone 80 MG chewable tablet Commonly known as:  MYLICON Chew 1 tablet (80 mg total) by mouth every 4 (four) hours as needed for flatulence (Indigestion).   SYNTHROID 75 MCG tablet Generic drug:  levothyroxine TAKE 1 TABLET DAILY BEFORE BREAKFAST What changed:  See the new instructions.   traMADol 50 MG tablet Commonly known as:  ULTRAM TAKE 1 TABLET BY MOUTH 3-4 TIMES PER DAY AS NEEDED FOR PAIN What changed:  See the new instructions.   Vitamin D3 1000 units Caps Take 1,000 Units by mouth 2 (two) times daily.       Follow-up Information    Nadara Mustard, MD Follow up in 1 week(s).   Specialty:  Orthopedic Surgery Contact information: [address] [place] [address] [phone]        Nadean Corwin, MD. Schedule an appointment as soon as possible for a visit in 1 week(s).   Specialty:  Internal Medicine Contact information: [address] [place] [address] [phone]           Major procedures and Radiology Reports - PLEASE review detailed and final reports thoroughly  -     TTE - report pending   Dg Chest 2 View  Result Date: 02/15/2016 CLINICAL DATA:  Chest pain, mid chest region. Recent fall. Cough and shortness of breath. EXAM: CHEST  2 VIEW COMPARISON:  January 06, 2016 and  January 04, 2016 FINDINGS: There are small pleural effusions bilaterally with patchy interstitial edema in the bases. The lungs elsewhere are clear. Heart is mildly enlarged with pulmonary vascularity within  normal limits. There is calcification throughout the aorta. No adenopathy. Bones are somewhat osteoporotic. There is anterior wedging of a mid thoracic vertebral body. No other fracture evident. No pneumothorax. IMPRESSION: Evidence of a degree of congestive heart failure. No appreciable airspace consolidation. There is aortic atherosclerosis. Bones osteoporotic with anterior wedge compression fracture in mid thoracic region. This fracture was not present 6 weeks prior and may well be related to recent apparent fall. Electronically Signed   By: Bretta BangWilliam  Woodruff III M.D.   On: 02/15/2016 15:17   Ct Chest W Contrast  Result Date: 02/15/2016 CLINICAL DATA:  82 y/o F; chest pain with history of pneumonia and hypertension. Recent falls with probable compression fracture on chest radiograph. EXAM: CT CHEST WITH CONTRAST CT ABDOMEN AND PELVIS WITH CONTRAST TECHNIQUE: Multidetector CT imaging of the chest was performed during intravenous contrast administration. Multidetector CT imaging of the abdomen and pelvis was performed following the standard protocol during bolus administration of intravenous contrast. CONTRAST:  100mL ISOVUE-300 IOPAMIDOL (ISOVUE-300) INJECTION 61% COMPARISON:  02/15/2016 chest radiograph. CT abdomen and pelvis 03/23/2014. CT chest 02/15/2010 FINDINGS: CT CHEST FINDINGS Cardiovascular: Right atrial and right ventricular enlargement. Coronary artery calcification. Normal size main pulmonary artery and thoracic aorta. Aortic atherosclerosis with moderate calcifications. Mediastinum/Nodes: No enlarged mediastinal, hilar, or axillary lymph nodes. Thyroid gland, trachea, and esophagus demonstrate no significant findings. Lungs/Pleura: Biapical scarring with calcifications is stable. 4 mm nodule  in the middle lobe along the minor fissure is stable. Linear opacities in lung bases are compatible with minor atelectasis. Mosaic attenuation of lung parenchyma and interlobular septal thickening consistent with mild pulmonary edema. Scattered 2-3 mm nodules in clusters probably represents a minimal bronchiolitis. Diffuse peribronchial thickening. Moderate right and small left pleural effusions. All Musculoskeletal: T7 moderate compression deformity. Chronic right third lateral and fourth lateral rib fractures. Left ninth posterior subacute rib fracture with callus. T9 vertebral body lucency with prominent vertical trabeculation compatible with hemangioma. CT ABDOMEN AND PELVIS FINDINGS Hepatobiliary: The segment 5, 16 mm enhancing focus (series 3, image 56). No other focal lesion liver lesion is identified. Density along posterior wall gallbladder may represent a polyp or stone and is stable. No biliary ductal dilatation. Pancreas: Persistent stable dilatation of the pancreatic duct in the tail with atrophy. Otherwise the pancreas is unremarkable. Spleen: Normal in size without focal abnormality. Adrenals/Urinary Tract: Right kidney stable lower pole 11 mm cyst. Normal adrenal glands. No renal stone or obstructive uropathy is identified. Normal bladder. Stomach/Bowel: Extensive sigmoid diverticulosis without evidence of diverticulitis. No inflammatory or obstructive changes of the bowel. Normal appendix. Vascular/Lymphatic: Aortic atherosclerosis. No enlarged abdominal or pelvic lymph nodes. Other: Stable left lumbar triangle fat containing hernia. Musculoskeletal: Degenerative changes of the lumbar spine with disc space narrowing greatest at L4-5 and L5-S1. No acute osseous abnormality is identified. IMPRESSION: 1. Interstitial pulmonary edema. Moderate right pleural effusion. Small left pleural effusion. 2. Cardiomegaly, predominantly right atrial and ventricular. Coronary artery calcifications. 3. Moderate T7  compression deformity. No significant retropulsion of bony elements. 4. Left 9th posterior rib subacute fracture with callus. 5. No acute process of abdomen or pelvis identified. 6. The 16 mm enhancing focus in the right lobe of liver was not definitely present on prior studies and is of uncertain significance. Consider follow-up CT or MRI in 6 months if clinically indicated. 7. Stable left lumbar triangle fat containing hernia. 8. Stable dilatation of the pancreatic duct in the pancreatic tail suggestive of slow growing or benign etiology such as IPMN. 9.  Sigmoid diverticulosis without evidence of diverticulitis. 10. Aortic atherosclerosis. Electronically Signed   By: Mitzi Hansen M.D.   On: 02/15/2016 21:31   Ct Abdomen Pelvis W Contrast  Result Date: 02/15/2016 CLINICAL DATA:  82 y/o F; chest pain with history of pneumonia and hypertension. Recent falls with probable compression fracture on chest radiograph. EXAM: CT CHEST WITH CONTRAST CT ABDOMEN AND PELVIS WITH CONTRAST TECHNIQUE: Multidetector CT imaging of the chest was performed during intravenous contrast administration. Multidetector CT imaging of the abdomen and pelvis was performed following the standard protocol during bolus administration of intravenous contrast. CONTRAST:  ISOVUE-300 IOPAMIDOL (ISOVUE-300) INJECTION 61% COMPARISON:  02/15/2016 chest radiograph. CT abdomen and pelvis 03/23/2014. CT chest 02/15/2010 FINDINGS: CT CHEST FINDINGS Cardiovascular: Right atrial and right ventricular enlargement. Coronary artery calcification. Normal size main pulmonary artery and thoracic aorta. Aortic atherosclerosis with moderate calcifications. Mediastinum/Nodes: No enlarged mediastinal, hilar, or axillary lymph nodes. Thyroid gland, trachea, and esophagus demonstrate no significant findings. Lungs/Pleura: Biapical scarring with calcifications is stable. 4 mm nodule in the middle lobe along the minor fissure is stable. Linear  opacities in lung bases are compatible with minor atelectasis. Mosaic attenuation of lung parenchyma and interlobular septal thickening consistent with mild pulmonary edema. Scattered 2-3 mm nodules in clusters probably represents a minimal bronchiolitis. Diffuse peribronchial thickening. Moderate right and small left pleural effusions. All Musculoskeletal: T7 moderate compression deformity. Chronic right third lateral and fourth lateral rib fractures. Left ninth posterior subacute rib fracture with callus. T9 vertebral body lucency with prominent vertical trabeculation compatible with hemangioma. CT ABDOMEN AND PELVIS FINDINGS Hepatobiliary: The segment 5, 16 mm enhancing focus (series 3, image 56). No other focal lesion liver lesion is identified. Density along posterior wall gallbladder may represent a polyp or stone and is stable. No biliary ductal dilatation. Pancreas: Persistent stable dilatation of the pancreatic duct in the tail with atrophy. Otherwise the pancreas is unremarkable. Spleen: Normal in size without focal abnormality. Adrenals/Urinary Tract: Right kidney stable lower pole 11 mm cyst. Normal adrenal glands. No renal stone or obstructive uropathy is identified. Normal bladder. Stomach/Bowel: Extensive sigmoid diverticulosis without evidence of diverticulitis. No inflammatory or obstructive changes of the bowel. Normal appendix. Vascular/Lymphatic: Aortic atherosclerosis. No enlarged abdominal or pelvic lymph nodes. Other: Stable left lumbar triangle fat containing hernia. Musculoskeletal: Degenerative changes of the lumbar spine with disc space narrowing greatest at L4-5 and L5-S1. No acute osseous abnormality is identified. IMPRESSION: 1. Interstitial pulmonary edema. Moderate right pleural effusion. Small left pleural effusion. 2. Cardiomegaly, predominantly right atrial and ventricular. Coronary artery calcifications. 3. Moderate T7 compression deformity. No significant retropulsion of bony  elements. 4. Left 9th posterior rib subacute fracture with callus. 5. No acute process of abdomen or pelvis identified. 6. The 16 mm enhancing focus in the right lobe of liver was not definitely present on prior studies and is of uncertain significance. Consider follow-up CT or MRI in 6 months if clinically indicated. 7. Stable left lumbar triangle fat containing hernia. 8. Stable dilatation of the pancreatic duct in the pancreatic tail suggestive of slow growing or benign etiology such as IPMN. 9. Sigmoid diverticulosis without evidence of diverticulitis. 10. Aortic atherosclerosis. Electronically Signed   By: Mitzi Hansen M.D.   On: 02/15/2016 21:31   Ct Tibia Fibula Left W Contrast  Result Date: 01/28/2016 CLINICAL DATA:  Lower leg contusion 1 week ago with hematoma involving the right lower extremity. The hematoma is draining and there is surrounding cellulitis. EXAM: CT OF THE LEFT TIBIA  AND FIBULA WITH CONTRAST TECHNIQUE: Standard CT imaging of the left lower extremity was performed with coronal and sagittal reformatted images. CONTRAST:  75mL ISOVUE-300 IOPAMIDOL (ISOVUE-300) INJECTION 61% COMPARISON:  Radiographs 01/17/2016 FINDINGS: There is a the cutaneous lesion which demonstrates high attenuation consistent with hematoma. It has the appearance of a large blister on the scan. No subcutaneous hematoma. Mild surrounding subcutaneous edema or inflammation below the lesion which could suggest cellulitis. No drainable soft tissue abscess. No findings for myofasciitis or pyomyositis. The knee and ankle joints are maintained. No findings to suggest septic arthritis. No destructive bony changes to suggest osteomyelitis. Extensive vascular calcifications are noted. IMPRESSION: Cutaneous complex fluid collection has the appearance of a large blister. It measures 7.7 x 3.8 x 1.0 cm. No significant surrounding cellulitis or subcutaneous abscess. No findings for myofasciitis or pyomyositis. No findings  for septic arthritis or osteomyelitis. Electronically Signed   By: Rudie Meyer M.D.   On: 01/28/2016 16:14    Micro Results    No results found for this or any previous visit (from the past 240 hour(s)).  Today   Subjective    [name] today has no headache,no chest abdominal pain,no new weakness tingling or numbness, feels much better wants to go home today.    Objective   Blood pressure (!) 141/80, pulse 60, temperature 97.6 F (36.4 C), resp. rate 15, height 5\' 2"  (1.575 m), weight 54.7 kg (120 lb 8 oz), SpO2 97 %.   Intake/Output Summary (Last 24 hours) at 02/19/16 0917 Last data filed at 02/19/16 0556  Gross per 24 hour  Intake              270 ml  Output              500 ml  Net             -230 ml    Exam Awake Alert, Oriented x 3, No new F.N deficits, Normal affect [place].AT,PERRAL, blind in left eye Supple Neck,No JVD, No cervical lymphadenopathy appriciated.  Symmetrical Chest wall movement, Good air movement bilaterally, CTAB RRR,No Gallops,Rubs or new Murmurs, No Parasternal Heave +ve B.Sounds, Abd Soft, Non tender, No organomegaly appriciated, No rebound -guarding or rigidity. Left leg ulcer and a bandage No Cyanosis, Clubbing or edema, No new Rash or bruise   Data Review   CBC w Diff: Lab Results  Component Value Date   WBC 9.5 02/16/2016   HGB 10.1 (L) 02/16/2016   HCT 30.7 (L) 02/16/2016   PLT 362 02/16/2016   LYMPHOPCT 21 02/06/2016   MONOPCT 20 02/06/2016   EOSPCT 10 02/06/2016   BASOPCT 1 02/06/2016    CMP: Lab Results  Component Value Date   NA 129 (L) 02/19/2016   K 4.2 02/19/2016   CL 97 (L) 02/19/2016   CO2 25 02/19/2016   BUN 18 02/19/2016   CREATININE 0.99 02/19/2016   CREATININE 0.99 (H) 02/06/2016   PROT 6.4 (L) 02/15/2016   ALBUMIN 3.4 (L) 02/15/2016   BILITOT 1.0 02/15/2016   ALKPHOS 57 02/15/2016   AST 23 02/15/2016   ALT 12 (L) 02/15/2016  .   Total Time in preparing paper work, data evaluation and todays exam - 35  minutes  Leroy Sea M.D on 02/19/2016 at 9:17 AM  [facility]   Office  [phone]

## 2016-02-19 NOTE — Progress Notes (Signed)
Notified by Roselyn BeringBrenda Graves CMRN of family request [facility]. Chart and patient information currently under review to confirm hospice eligibility.  Spoke with the daughter Cristal FordMaranda Frazier at bedside to initiate education related to hospice philosophy, services and team approach to care. Family verbalized understanding of the information provided. This patient had previously been followed by our Palliative Care team at home. Per discussion plan is for discharge to home today. Daughter is requesting ambulance transfer as she may need additional help when arriving at home.   Please send signed completed DNR form home with patient.   Patient will need prescriptions for discharge comfort medications.   DME needs discussed and family requested hospital bed, OBT, BSC and oxygen at 2L for PRN use.  [facility] equipment manager Jewel Kizzie BaneHughes notified and will contact [facility] to arrange delivery to the home.  The home address has been verified and is correct in the chart;  Cristal FordMaranda Frazier is the family member to be contacted to arrange time of delivery.  [facility] aware of the above.   An admission nurse is scheduled for 3pm today.   Completed discharge summary will need to be [facility] to [facility] at [phone] when final.  Please notify [facility] when patient is ready to leave unit at discharge-call [phone].  [facility] information and contact numbers have been given to the daughter during visit.  Above information shared with Roselyn BeringBrenda Graves,  CMRN.  Please call with any questions.  Thank You,  Lavone NeriSusan Thomas, RN  [facility] Hospital Liaison  [phone]

## 2016-02-19 NOTE — Discharge Instructions (Signed)
Hospice Hospice is a service that is designed to provide people who are terminally ill and their families with medical, spiritual, and psychological support. Its aim is to improve your quality of life by keeping you as alert and comfortable as possible. Hospice is performed by a team of health care professionals and volunteers who:  Help keep you comfortable. Hospice can be provided in your home or in a homelike setting. The hospice staff works with your family and friends to help meet your needs. You will enjoy the support of loved ones by receiving much of your basic care from family and friends.  Provide pain relief and manage your symptoms. The staff supply all necessary medicines and equipment.  Provide companionship when you are alone.  Allow you and your family to rest. They may do light housekeeping, prepare meals, and run errands.  Provide counseling. They will make sure your emotional, spiritual, and social needs and those of your family are being met.  Provide spiritual care. Spiritual care is individualized to meet your needs and your family's needs. It may involve helping you look at what death means to you, say goodbye, or perform a specific religious ceremony or ritual. Hospice teams often include:  A nurse.  A doctor.  Social workers.  Religious leaders (such as a Clinical biochemist).  Trained volunteers. WHEN SHOULD HOSPICE CARE BEGIN? Most people who use hospice are believed to have fewer than 6 months to live. Your family and health care providers can help you decide when hospice services should begin. If your condition improves, you may discontinue the program. WHAT [place]? Most hospice programs are run by nonprofit, independent organizations. Some are affiliated with hospitals, nursing homes, or home health care agencies. Hospice programs can take place in the home or at a hospice center, hospital, or skilled nursing facility. When choosing  a hospice program, ask the following questions:  What services are available to me?  What services are offered to my loved ones?  How involved are my loved ones?  How involved is my health care provider?  Who makes up the hospice care team? How are they trained or screened?  How will my pain and symptoms be managed?  If my circumstances change, can the services be provided in a different setting, such as my home or in the hospital?  Is the program reviewed and licensed by the state or certified in some other way? WHERE CAN I LEARN MORE ABOUT HOSPICE? You can learn about existing hospice programs in your area from your health care providers. You can also read more about hospice online. The websites of the following organizations contain helpful information:  The [facility] and Palliative Care Organization [facility]).  The Hospice Association of America ([place]).  The [place].  The American Cancer Society (ACS).  Hospice Net.   This information is not intended to replace advice given to you by your health care provider. Make sure you discuss any questions you have with your health care provider.   Document Released: 08/29/2003 Document Revised: 05/17/2013 Document Reviewed: 03/22/2013 Elsevier Interactive Patient Education 2016 [place].   Follow with Primary MD Anne Richards, MD in 7 days   Activity: As tolerated with Full fall precautions use walker/cane & assistance as needed  Disposition Home with hospice  Diet:   Dysphagia 3 diet with feeding assistance and aspiration precautions.  For Heart failure patients - Check your Weight same time everyday, if you gain over  2 pounds, or you develop in leg swelling, experience more shortness of breath or chest pain, call your Primary MD immediately. Follow Cardiac Low Salt Diet and 1.5 lit/day fluid restriction.   On your next visit with your primary care physician please Get Medicines reviewed  and adjusted.   Please request your Prim.MD to go over all Hospital Tests and Procedure/Radiological results at the follow up, please get all Hospital records sent to your Prim MD by signing hospital release before you go home.   If you experience worsening of your admission symptoms, develop shortness of breath, life threatening emergency, suicidal or homicidal thoughts you must seek medical attention immediately by calling 911 or calling your MD immediately  if symptoms less severe.  You Must read complete instructions/literature along with all the possible adverse reactions/side effects for all the Medicines you take and that have been prescribed to you. Take any new Medicines after you have completely understood and accpet all the possible adverse reactions/side effects.   Do not drive, operate heavy machinery, perform activities at heights, swimming or participation in water activities or provide baby sitting services if your were admitted for syncope or siezures until you have seen by Primary MD or a Neurologist and advised to do so again.  Do not drive when taking Pain medications.    Do not take more than prescribed Pain, Sleep and Anxiety Medications  Special Instructions: If you have smoked or chewed Tobacco  in the last 2 yrs please stop smoking, stop any regular Alcohol  and or any Recreational drug use.  Wear Seat belts while driving.   Please note  You were cared for by a hospitalist during your hospital stay. If you have any questions about your discharge medications or the care you received while you were in the hospital after you are discharged, you can call the unit and asked to speak with the hospitalist on call if the hospitalist that took care of you is not available. Once you are discharged, your primary care physician will handle any further medical issues. Please note that NO REFILLS for any discharge medications will be authorized once you are discharged, as it is  imperative that you return to your primary care physician (or establish a relationship with a primary care physician if you do not have one) for your aftercare needs so that they can reassess your need for medications and monitor your lab values.

## 2016-02-19 NOTE — Telephone Encounter (Signed)
Error - duplicate entry.

## 2016-02-19 NOTE — Telephone Encounter (Signed)
A[facility] called and states the patient is being discharged from the hospital today and will be admitted to in home Hospice care.  Per Dr Oneta RackMcKeown, he will be her attending physician and it is OK to activate standing Hospice orders.

## 2016-02-19 NOTE — Care Management Note (Addendum)
Case Management Note  Patient Details  Name: [name] MRN: [id number] Date of Birth: 09/21/1914  Subjective/Objective:  Pt presented for epigastric pain. Pt is from home with daughter [name]. Pt has DME RW / Paediatric nursehower Chair. Palliative Care Consulted and the plan will be for home with Hospice Services.                  Action/Plan: Referral Received for Hospice Services: CM was able to speak with pt and family in regards to disposition needs. Agency List provided to family. [facility] and would like [facility]. Referral was called in to office and [facility]iaison to contact family in regards to services and DME needs. Family to transport via car. No further needs from CM at this time.   Expected Discharge Date:  02/19/16               Expected Discharge Plan:  Home w Hospice Care  In-House Referral:  NA  Discharge planning Services  CM Consult  Post Acute Care Choice:  Hospice Choice offered to:  Patient, Adult Children  DME Arranged:  N/A DME Agency:  Other - Comment ([facility] to assist with DME needs. )  HH Arranged:  RN HH Agency:  [facility]  Status of Service:  Completed, signed off  If discussed at Long Length of Stay Meetings, dates discussed:    Additional Comments: [id number] 02-19-16 Tomi BambergerBrenda Graves-Bigelow, RN,BSN [phone] CM did speak with Liaison [name] and now pt would like to use the ambulance for transport home. CM filled out Medical Necessity form and [facility]AR to provide transportation home. Charge RN to call once pt is ready for d/c.  No further needs from CM at this time.  Gala LewandowskyGraves-Bigelow, Anne Przybysz Kaye, RN 02/19/2016, 10:18 AM

## 2016-02-27 ENCOUNTER — Ambulatory Visit (INDEPENDENT_AMBULATORY_CARE_PROVIDER_SITE_OTHER): Payer: Self-pay | Admitting: Orthopedic Surgery

## 2016-03-04 ENCOUNTER — Telehealth: Payer: Self-pay | Admitting: *Deleted

## 2016-03-04 MED ORDER — LORAZEPAM 1 MG PO TABS: ORAL_TABLET | ORAL | 0 refills | Status: AC

## 2016-03-04 NOTE — Telephone Encounter (Signed)
[name] called and reported the patient is very agitated at night and requested an RX to help with this.. Per Dr Oneta RackMcKeown, it is OK to send in an RX for Lorazepam 1 mg 1 to 2 tablets every 4 hours prn.  [name] ws advised and will call the daughter to inform her.

## 2016-03-19 ENCOUNTER — Ambulatory Visit: Payer: Self-pay | Admitting: Internal Medicine

## 2016-03-24 ENCOUNTER — Ambulatory Visit: Payer: Self-pay | Admitting: Internal Medicine

## 2016-04-11 ENCOUNTER — Ambulatory Visit (INDEPENDENT_AMBULATORY_CARE_PROVIDER_SITE_OTHER): Admitting: Internal Medicine

## 2016-04-11 ENCOUNTER — Encounter: Payer: Self-pay | Admitting: Internal Medicine

## 2016-04-11 VITALS — BP 160/72 | HR 58 | Temp 98.0°F | Resp 14 | Ht 62.0 in | Wt 114.0 lb

## 2016-04-11 DIAGNOSIS — K648 Other hemorrhoids: Secondary | ICD-10-CM

## 2016-04-11 DIAGNOSIS — K5730 Diverticulosis of large intestine without perforation or abscess without bleeding: Secondary | ICD-10-CM

## 2016-04-11 DIAGNOSIS — K5900 Constipation, unspecified: Secondary | ICD-10-CM | POA: Diagnosis not present

## 2016-04-11 MED ORDER — HYDROCORTISONE 2.5 % RE CREA
1.0000 | TOPICAL_CREAM | Freq: Two times a day (BID) | RECTAL | 0 refills | Status: AC
Start: 2016-04-11 — End: ?

## 2016-04-11 NOTE — Patient Instructions (Signed)
Please use the hydrocortisone cream on her hemorrhoids twice daily to help with external and internal hemorrhoids.  Please have her sit in warm water after baths to help soothe the hemorrhoids if they become painful and sore.  Please put a little vaseline on the rectum prior to a bowel movement to help the stools pass more easily.  Please use colace twice daily until a good bowel movement is achieved.  Stop if she develops diarrhea.  Please go to the ER if she develops significant bleeding from her rectum both with or without a bowel movement.  Please elevate her legs as much as possible over the next several days and start wearing compression socks during the daytime.  You can use a combination of sugar and iodine to form a paste to help keep the ulcer on her toe from getting infected and this will also help dry it out.

## 2016-04-11 NOTE — Progress Notes (Signed)
   Subjective:    Patient ID: [name], female    DOB: 10/22/1914, 82 y.o.   MRN: [id number]  HPI  Patient presents to the office with her daughter for evaluation of mucous and blood in the stools.  She has been having a lot of mucous that has made her go through several rolls of toilet paper.  She is having mucous leakage even when she doesn't have a bowel movement.  She does have a history of hemorrhoids and also had very extensive diverticulosis.  She is currently on hospice and has been having a nurse come out weekly.  Immaculate reports that she has not had good normal bowel movements lately.  She does occasionally take stool softeners but has not taken one recently.   Review of Systems  Constitutional: Negative for chills, fatigue and fever.  Respiratory: Negative for chest tightness and shortness of breath.   Cardiovascular: Positive for leg swelling. Negative for chest pain and palpitations.  Gastrointestinal: Positive for blood in stool, constipation and rectal pain. Negative for abdominal distention, abdominal pain, anal bleeding, diarrhea, nausea and vomiting.  Genitourinary: Negative.        Objective:   Physical Exam  Constitutional: She is oriented to person, place, and time. She appears well-developed and well-nourished. No distress.  HENT:  Head: Normocephalic.  Mouth/Throat: Oropharynx is clear and moist. No oropharyngeal exudate.  Eyes: No scleral icterus.  Neck: Normal range of motion. Neck supple. No JVD present. No thyromegaly present.  Cardiovascular: Normal rate, regular rhythm and intact distal pulses.  Exam reveals no gallop and no friction rub.   Murmur heard. Pulmonary/Chest: Effort normal and breath sounds normal. No respiratory distress. She has no wheezes. She has no rales. She exhibits no tenderness.  Abdominal: Soft. Bowel sounds are normal. She exhibits no distension and no mass. There is no tenderness. There is no rebound and no guarding.  Genitourinary:  Rectal exam shows internal hemorrhoid, anal tone abnormal and guaiac positive stool (one single trace spot visible). Rectal exam shows no external hemorrhoid, no fissure, no mass and no tenderness.     Lymphadenopathy:    She has no cervical adenopathy.  Neurological: She is alert and oriented to person, place, and time.  Skin: Skin is warm and dry. She is not diaphoretic.  Nursing note and vitals reviewed.   Vitals:   04/11/16 1123  BP: (!) 160/72  Pulse: (!) 58  Resp: 14  Temp: 98 F (36.7 C)         Assessment & Plan:    1. Constipation, unspecified constipation type -take colace twice daily until good BM -try to increase fiber intake  2. Internal hemorrhoid -anusol -avoid straining -sitz baths -avoid excessive wiping   3. Diverticulosis of large intestine without hemorrhage -doubt this is cause of bleeding due to small amount of blood; however if patient with significant amount of bleeding she is to go to the hospital for help with comfort measures and not invasive treatment

## 2016-04-17 ENCOUNTER — Emergency Department (HOSPITAL_BASED_OUTPATIENT_CLINIC_OR_DEPARTMENT_OTHER)
Admit: 2016-04-17 | Discharge: 2016-04-17 | Disposition: A | Discharge status: Home / Self Care | Attending: Emergency Medicine | Admitting: Emergency Medicine

## 2016-04-17 ENCOUNTER — Emergency Department (HOSPITAL_COMMUNITY)
Admission: EM | Admit: 2016-04-17 | Discharge: 2016-04-17 | Disposition: A | Discharge status: Home / Self Care | Attending: Emergency Medicine | Admitting: Emergency Medicine

## 2016-04-17 ENCOUNTER — Emergency Department (HOSPITAL_COMMUNITY)

## 2016-04-17 ENCOUNTER — Encounter (HOSPITAL_COMMUNITY): Payer: Self-pay | Admitting: *Deleted

## 2016-04-17 DIAGNOSIS — I10 Essential (primary) hypertension: Secondary | ICD-10-CM | POA: Diagnosis not present

## 2016-04-17 DIAGNOSIS — Z79899 Other long term (current) drug therapy: Secondary | ICD-10-CM | POA: Insufficient documentation

## 2016-04-17 DIAGNOSIS — K5900 Constipation, unspecified: Secondary | ICD-10-CM | POA: Diagnosis not present

## 2016-04-17 DIAGNOSIS — M7989 Other specified soft tissue disorders: Secondary | ICD-10-CM

## 2016-04-17 DIAGNOSIS — K648 Other hemorrhoids: Secondary | ICD-10-CM | POA: Diagnosis not present

## 2016-04-17 DIAGNOSIS — Z7982 Long term (current) use of aspirin: Secondary | ICD-10-CM | POA: Diagnosis not present

## 2016-04-17 DIAGNOSIS — K625 Hemorrhage of anus and rectum: Secondary | ICD-10-CM | POA: Diagnosis present

## 2016-04-17 DIAGNOSIS — B379 Candidiasis, unspecified: Secondary | ICD-10-CM | POA: Insufficient documentation

## 2016-04-17 DIAGNOSIS — E039 Hypothyroidism, unspecified: Secondary | ICD-10-CM | POA: Diagnosis not present

## 2016-04-17 DIAGNOSIS — K649 Unspecified hemorrhoids: Secondary | ICD-10-CM | POA: Diagnosis not present

## 2016-04-17 LAB — COMPREHENSIVE METABOLIC PANEL
ALBUMIN: 4.3 g/dL (ref 3.5–5.0)
ALK PHOS: 64 U/L (ref 38–126)
ALT: 17 U/L (ref 14–54)
AST: 28 U/L (ref 15–41)
Anion gap: 9 (ref 5–15)
BUN: 17 mg/dL (ref 6–20)
CALCIUM: 9.4 mg/dL (ref 8.9–10.3)
CHLORIDE: 100 mmol/L — AB (ref 101–111)
CO2: 26 mmol/L (ref 22–32)
CREATININE: 0.97 mg/dL (ref 0.44–1.00)
GFR calc Af Amer: 54 mL/min — ABNORMAL LOW (ref >60)
GFR calc non Af Amer: 47 mL/min — ABNORMAL LOW (ref >60)
GLUCOSE: 110 mg/dL — AB (ref 65–99)
Potassium: 4 mmol/L (ref 3.5–5.1)
SODIUM: 135 mmol/L (ref 135–145)
Total Bilirubin: 1 mg/dL (ref 0.3–1.2)
Total Protein: 7.2 g/dL (ref 6.5–8.1)

## 2016-04-17 LAB — CBC WITH DIFFERENTIAL/PLATELET
BASOS PCT: 1 %
Basophils Absolute: 0.1 10*3/uL (ref 0.0–0.1)
EOS ABS: 0.5 10*3/uL (ref 0.0–0.7)
Eosinophils Relative: 5 %
HEMATOCRIT: 35.7 % — AB (ref 36.0–46.0)
Hemoglobin: 11.6 g/dL — ABNORMAL LOW (ref 12.0–15.0)
LYMPHS ABS: 1.6 10*3/uL (ref 0.7–4.0)
Lymphocytes Relative: 17 %
MCH: 30 pg (ref 26.0–34.0)
MCHC: 32.5 g/dL (ref 30.0–36.0)
MCV: 92.2 fL (ref 78.0–100.0)
MONO ABS: 1.7 10*3/uL — AB (ref 0.1–1.0)
MONOS PCT: 19 %
Neutro Abs: 5.4 10*3/uL (ref 1.7–7.7)
Neutrophils Relative %: 58 %
Platelets: 333 10*3/uL (ref 150–400)
RBC: 3.87 MIL/uL (ref 3.87–5.11)
RDW: 14.1 % (ref 11.5–15.5)
WBC: 9.2 10*3/uL (ref 4.0–10.5)

## 2016-04-17 LAB — URINALYSIS, ROUTINE W REFLEX MICROSCOPIC
BILIRUBIN URINE: NEGATIVE
Glucose, UA: NEGATIVE mg/dL
HGB URINE DIPSTICK: NEGATIVE
KETONES UR: NEGATIVE mg/dL
Leukocytes, UA: NEGATIVE
NITRITE: NEGATIVE
PH: 5.5 (ref 5.0–8.0)
Protein, ur: NEGATIVE mg/dL
Specific Gravity, Urine: 1.017 (ref 1.005–1.030)

## 2016-04-17 LAB — PROTIME-INR
INR: 1.04
Prothrombin Time: 13.7 seconds (ref 11.4–15.2)

## 2016-04-17 MED ORDER — CLOTRIMAZOLE 1 % EX CREA: TOPICAL_CREAM | CUTANEOUS | 0 refills | Status: AC

## 2016-04-17 NOTE — ED Triage Notes (Signed)
Pt states she noticed bright red bleeding from her rectum today when having a bowel movement. Pt has hx of hemmoroids. Pt also complains of bilateral leg swelling, worse in right leg

## 2016-04-17 NOTE — Progress Notes (Signed)
VASCULAR LAB PRELIMINARY  PRELIMINARY  PRELIMINARY  PRELIMINARY  Right lower extremity venous duplex completed.    Preliminary report:  There is no DVT or SVT noted in the right lower extremity.    Gave report to Dr. Octavio GravesPfeiffer  Kathlynn Swofford, [facility], RVT 04/17/2016, 5:47 PM

## 2016-04-17 NOTE — ED Provider Notes (Signed)
[facility]-EMERGENCY DEPT Provider Note   CSN: [id number] Arrival date & time: 04/17/16  1413     History   Chief Complaint Chief Complaint  Patient presents with  . Rectal Bleeding    HPI [name] is a 82 y.o. female.  HPI Patient has been dealing with hemorrhoids for approximately a month. She had been trying suppositories and stool softeners. She reports today she had increased bleeding. There was both bright red bleeding and clot. She reports the hemorrhoids are sometimes painful in a sitting position. She is not having abdominal pain. Her caregiver reports that despite giving stool softeners she is not having very regular bowel movements. Patient denies abdominal pain or vomiting. She denies chest pain or shortness of breath. Caregiver also points out swelling of the right lower extremity is developed over the past evening. She reports that her left leg was swollen before due to a hematoma but now last resolved and is much better. She describes the right lower started swelling is being unprovoked and developing overnight. Patient denies pain. No fever, no chills. Patient does have history of chronic atrial fibrillation. She takes a daily aspirin but no other anticoagulants. Past Medical History:  Diagnosis Date  . Atrial fibrillation (HCC)    not a candidate for coumadin due to falls  . AV block, 1st degree   . CVD (cerebrovascular disease)    History of CVA  . GERD (gastroesophageal reflux disease)   . History of echocardiogram 9/11   normal EF, mild to mod MR & TR, mod pulmonary HTN  . Hypertension   . Hypothyroidism   . Osteoarthritis   . Pneumonia 09/11  . Prediabetes   . Syncope and collapse    Negative loop recorder in the past  . Traumatic enucleation of eye    left eye    Patient Active Problem List   Diagnosis Date Noted  . Palliative care encounter   . Goals of care, counseling/discussion   . Encounter for hospice care discussion   . Chest pain 02/15/2016   . Epigastric pain 02/15/2016  . Pancreatic mass 02/15/2016  . Hematoma 01/26/2016  . Hyponatremia 01/26/2016  . AKI (acute kidney injury) (HCC) 01/26/2016  . Cellulitis of left lower extremity 01/25/2016  . Orthostatic hypotension 01/04/2016  . Syncope and collapse 01/04/2016  . Syncope 01/04/2016  . Malnutrition of mild degree (HCC) 11/13/2015  . Vitamin D deficiency 08/30/2014  . Medication management 08/30/2014  . Hyperlipidemia 06/29/2013  . Prediabetes   . GERD (gastroesophageal reflux disease)   . Essential hypertension   . AV block, 1st degree   . CVD (cerebrovascular disease)   . Osteoarthritis   . Hypothyroidism   . Atrial fibrillation [facility])     Past Surgical History:  Procedure Laterality Date  . BACK SURGERY    . INGUINAL HERNIA REPAIR     left sided    OB History    No data available       Home Medications    Prior to Admission medications   Medication Sig Start Date End Date Taking? Authorizing Provider  acetaminophen (TYLENOL) 325 MG tablet Take 325-650 mg by mouth See admin instructions. 325 mg when Tramadol is taken; 650 mg as needed for headache or pain and NO Tramadol   Yes Historical Provider, MD  aspirin 81 MG tablet Take 81 mg by mouth every evening.    Yes Historical Provider, MD  beta carotene w/minerals (OCUVITE) tablet Take 1 tablet by mouth 2 (two)  times daily.   Yes Historical Provider, MD  carvedilol (COREG) 6.25 MG tablet Take 1 tablet (6.25 mg total) by mouth 2 (two) times daily with a meal. 02/19/16  Yes Leroy SeaPrashant K Singh, MD  Cholecalciferol (VITAMIN D3) 1000 UNITS CAPS Take 1,000 Units by mouth 2 (two) times daily.    Yes Historical Provider, MD  docusate sodium (COLACE) 100 MG capsule Take 100 mg by mouth 2 (two) times daily as needed for mild constipation.  01/06/16  Yes Historical Provider, MD  DULoxetine (CYMBALTA) 60 MG capsule Take 1 capsule (60 mg total) by mouth daily. Patient taking differently: Take 60 mg by mouth every  morning.  02/06/16  Yes Lucky CowboyWilliam McKeown, MD  gabapentin (NEURONTIN) 100 MG capsule Take 1 capsule (100 mg total) by mouth 2 (two) times daily. Patient taking differently: Take 100 mg by mouth at bedtime.  02/01/16  Yes Courtney Forcucci, PA-C  hydrocortisone (ANUSOL-HC) 2.5 % rectal cream Place 1 application rectally 2 (two) times daily. 04/11/16  Yes Courtney Forcucci, PA-C  lidocaine (LIDODERM) 5 % Place 1 patch onto the skin every 12 (twelve) hours. Remove & Discard patch within 12 hours or as directed by MD 02/19/16 02/18/17 Yes Lucky CowboyWilliam McKeown, MD  LORazepam (ATIVAN) 1 MG tablet Take 1 to 2 tablets every 4 hours PRN for agitation. Patient taking differently: Take 1-2 mg by mouth every 4 (four) hours as needed for anxiety (AGITATION).  03/04/16  Yes Lucky CowboyWilliam McKeown, MD  MAGNESIUM PO Take 1 tablet by mouth every morning.    Yes Historical Provider, MD  pantoprazole (PROTONIX) 40 MG tablet Take 1 tablet (40 mg total) by mouth daily. Patient taking differently: Take 40 mg by mouth every morning.  02/19/16  Yes Leroy SeaPrashant K Singh, MD  senna-docusate (SENOKOT-S) 8.6-50 MG tablet Take 1 tablet by mouth at bedtime as needed for mild constipation. 02/19/16  Yes Leroy SeaPrashant K Singh, MD  simethicone (MYLICON) 80 MG chewable tablet Chew 1 tablet (80 mg total) by mouth every 4 (four) hours as needed for flatulence (Indigestion). 02/19/16  Yes Leroy SeaPrashant K Singh, MD  SYNTHROID 75 MCG tablet TAKE 1 TABLET DAILY BEFORE BREAKFAST Patient taking differently: TAKE 1 TABLET (75 mcg) DAILY BEFORE BREAKFAST 10/09/15  Yes Quentin MullingAmanda Collier, PA-C  traMADol (ULTRAM) 50 MG tablet TAKE 1 TABLET BY MOUTH 3-4 TIMES PER DAY AS NEEDED FOR PAIN Patient taking differently: TAKE 50 MG BY MOUTH NIGHTLY. MAY ALSO TAKE AS NEEDED FOR PAIN 02/14/16  Yes Quentin MullingAmanda Collier, PA-C  clotrimazole (LOTRIMIN) 1 % cream Apply to affected area 2 times daily 04/17/16   Arby BarretteMarcy Kohen Reither, MD  ondansetron (ZOFRAN) 4 MG tablet Take 1 tablet (4 mg total) by mouth every 6  (six) hours. Patient taking differently: Take 4 mg by mouth every 6 (six) hours as needed for nausea.  06/28/14   Tilden FossaElizabeth Rees, MD    Family History Family History  Problem Relation Age of Onset  . Heart attack Father   . Cancer Brother   . Stroke Brother   . Heart attack Brother     Social History Social History  Substance Use Topics  . Smoking status: Never Smoker  . Smokeless tobacco: Never Used  . Alcohol use No     Allergies   Augmentin [amoxicillin-pot clavulanate]; Ciprocin-fluocin-procin [fluocinolone acetonide]; Ciprofloxacin hcl; Codeine; and Sulfa drugs cross reactors   Review of Systems Review of Systems 10 Systems reviewed and are negative for acute change except as noted in the HPI.   Physical Exam Updated Vital Signs  BP 173/63 (BP Location: Right Arm)   Pulse (!) 59   Temp 98.7 F (37.1 C) (Oral)   Resp 18   SpO2 95%   Physical Exam  Constitutional: She is oriented to person, place, and time. She appears well-developed and well-nourished. No distress.  Patient is alert and clinically well in appearance. She has no respiratory distress. Color is good.  HENT:  Head: Normocephalic and atraumatic.  Eyes: EOM are normal.  Cardiovascular: Normal rate, regular rhythm, normal heart sounds and intact distal pulses.   Cannot appreciate significant irregularity of cardiac rhythm to auscultation.  Pulmonary/Chest: Effort normal and breath sounds normal.  Abdominal: Soft.  Patient has a soft and easily reproducible right lower anterior abdominal wall hernia. This is nontender. Otherwise no tenderness or mass.  Genitourinary:  Genitourinary Comments: Perirectal area shows some maceration consistent with chemical or candidal perianal infection. There are no protruding hemorrhoids at this time. She does have palpable internal hemorrhoids with pink tinged mucus in the rectal vault. No stool or impaction in the vault.  Musculoskeletal:  2+ pitting edema right lower  extremity. Calf is nontender. Left lower extremity 1+ edema. Calf nontender. Skin thinning but no evidence of cellulitis or ulcerative wounds. Multiple varicosities.  Neurological: She is alert and oriented to person, place, and time. She exhibits normal muscle tone. Coordination normal.  Skin: Skin is warm and dry.  Psychiatric: She has a normal mood and affect.     ED Treatments / Results  Labs (all labs ordered are listed, but only abnormal results are displayed) Labs Reviewed  COMPREHENSIVE METABOLIC PANEL - Abnormal; Notable for the following:       Result Value   Chloride 100 (*)    Glucose, Bld 110 (*)    GFR calc non Af Amer 47 (*)    GFR calc Af Amer 54 (*)    All other components within normal limits  CBC WITH DIFFERENTIAL/PLATELET - Abnormal; Notable for the following:    Hemoglobin 11.6 (*)    HCT 35.7 (*)    Monocytes Absolute 1.7 (*)    All other components within normal limits  PROTIME-INR  URINALYSIS, ROUTINE W REFLEX MICROSCOPIC (NOT [facility])  POC OCCULT BLOOD, ED    EKG  EKG Interpretation None       Radiology Dg Abd Acute W/chest  Result Date: 04/17/2016 CLINICAL DATA:  Constipation for 3 weeks EXAM: DG ABDOMEN ACUTE W/ 1V CHEST COMPARISON:  02/15/2016, FINDINGS: AP semi-erect view of the chest demonstrates no large effusion. Stable mild cardiomegaly with prominent central vascularity. Irregular opacities in the right greater than left lung apices could relate to pulmonary scarring. Bibasilar streaky atelectasis without focal consolidation. Atherosclerosis of the aorta. No pneumothorax. Supine and decubitus views of the abdomen demonstrate no gross free air. Bowel gas pattern is non obstructed with mild stool in the colon. Calcified phleboliths in the right pelvis. Atherosclerotic vascular calcification of the aorta and within the pelvis. IMPRESSION: 1. Mild cardiomegaly with m prominent central pulmonary vessels but no gross edema 2. Bibasilar atelectasis  and/or scarring 3. Nonobstructed bowel-gas pattern Electronically Signed   By: Jasmine PangKim  Fujinaga M.D.   On: 04/17/2016 18:38    Procedures Procedures (including critical care time)  Medications Ordered in ED Medications - No data to display   Initial Impression / Assessment and Plan / ED Course  I have reviewed the triage vital signs and the nursing notes.  Pertinent labs & imaging results that were available during my care  of the patient were reviewed by me and considered in my medical decision making (see chart for details).  Clinical Course     Final Clinical Impressions(s) / ED Diagnoses   Final diagnoses:  Hemorrhoids, unspecified hemorrhoid type  Candida infection  Swollen leg  Patient's chief complaint is bleeding from hemorrhoids. There are no thrombosed hemorrhoids. Patient does have mildly macerated and slightly diffusely swollen perirectal area. The digital exam however shows a rectal vault to be free of any stool impaction and several nonthrombosed internal hemorrhoids. Suspect a rectal yeast infection. This may be causing the patient to have sensation of urgency to defecate but not passing stool. Acute abdominal series does not show any large amount of stool burden. The patient is not showing urinary retention. Urinalysis is negative for infection. At this time, plan will be to follow up with PCP.  New Prescriptions New Prescriptions   CLOTRIMAZOLE (LOTRIMIN) 1 % CREAM    Apply to affected area 2 times daily     Arby Barrette, MD 04/17/16 2148

## 2016-04-18 ENCOUNTER — Emergency Department (HOSPITAL_COMMUNITY)

## 2016-04-18 ENCOUNTER — Emergency Department (HOSPITAL_COMMUNITY)
Admission: EM | Admit: 2016-04-18 | Discharge: 2016-04-18 | Disposition: A | Discharge status: Home / Self Care | Attending: Emergency Medicine | Admitting: Emergency Medicine

## 2016-04-18 ENCOUNTER — Encounter (HOSPITAL_COMMUNITY): Payer: Self-pay | Admitting: Nurse Practitioner

## 2016-04-18 DIAGNOSIS — S0083XA Contusion of other part of head, initial encounter: Secondary | ICD-10-CM | POA: Insufficient documentation

## 2016-04-18 DIAGNOSIS — M25512 Pain in left shoulder: Secondary | ICD-10-CM | POA: Diagnosis not present

## 2016-04-18 DIAGNOSIS — Z8673 Personal history of transient ischemic attack (TIA), and cerebral infarction without residual deficits: Secondary | ICD-10-CM | POA: Diagnosis not present

## 2016-04-18 DIAGNOSIS — W228XXA Striking against or struck by other objects, initial encounter: Secondary | ICD-10-CM | POA: Diagnosis not present

## 2016-04-18 DIAGNOSIS — R55 Syncope and collapse: Secondary | ICD-10-CM | POA: Insufficient documentation

## 2016-04-18 DIAGNOSIS — Y939 Activity, unspecified: Secondary | ICD-10-CM | POA: Insufficient documentation

## 2016-04-18 DIAGNOSIS — I10 Essential (primary) hypertension: Secondary | ICD-10-CM | POA: Diagnosis not present

## 2016-04-18 DIAGNOSIS — Y999 Unspecified external cause status: Secondary | ICD-10-CM | POA: Insufficient documentation

## 2016-04-18 DIAGNOSIS — S40212A Abrasion of left shoulder, initial encounter: Secondary | ICD-10-CM | POA: Diagnosis not present

## 2016-04-18 DIAGNOSIS — Z7982 Long term (current) use of aspirin: Secondary | ICD-10-CM | POA: Diagnosis not present

## 2016-04-18 DIAGNOSIS — S4992XA Unspecified injury of left shoulder and upper arm, initial encounter: Secondary | ICD-10-CM | POA: Diagnosis not present

## 2016-04-18 DIAGNOSIS — E039 Hypothyroidism, unspecified: Secondary | ICD-10-CM | POA: Diagnosis not present

## 2016-04-18 DIAGNOSIS — W19XXXA Unspecified fall, initial encounter: Secondary | ICD-10-CM

## 2016-04-18 DIAGNOSIS — S61214A Laceration without foreign body of right ring finger without damage to nail, initial encounter: Secondary | ICD-10-CM | POA: Insufficient documentation

## 2016-04-18 DIAGNOSIS — S0990XA Unspecified injury of head, initial encounter: Secondary | ICD-10-CM | POA: Diagnosis not present

## 2016-04-18 DIAGNOSIS — Y92009 Unspecified place in unspecified non-institutional (private) residence as the place of occurrence of the external cause: Secondary | ICD-10-CM | POA: Diagnosis not present

## 2016-04-18 DIAGNOSIS — S6991XA Unspecified injury of right wrist, hand and finger(s), initial encounter: Secondary | ICD-10-CM | POA: Diagnosis present

## 2016-04-18 NOTE — Discharge Instructions (Signed)
A face to face home health evaluation for physical therapy has been requested for you following this visit. You should be contacted next week with further instructions. You should also discuss this with your primary doctor at your appointment next week.

## 2016-04-18 NOTE — ED Triage Notes (Signed)
Pt presents with c/o fall. Today she fell and landed on her L shoulder. She c/o pain in the shoulder. She is able to move the extremity easily. She describes the pain as moderate. She is unsure why she fell. She denies any other injuries.She reports increased falls recently.

## 2016-04-18 NOTE — ED Provider Notes (Signed)
[facility]-EMER[facility]ENCY DEPT Provider Note   CSN: [id number] Arrival date & time: 04/18/16  1445     History   Chief Complaint Chief Complaint  Patient presents with  . Fall    HPI [name] is a 82 y.o. female.  HPI Patient is a 82 year old female with past medical history significant for atrial fibrillation (on aspirin only), hypertension, multiple falls who presents after mechanical fall at home. Patient reports she thinks she must have tripped. She recalls falling and hitting her left shoulder on the couch. Denies hitting head or loss of consciousness. Family heard patient fall and found patient on the floor alert and talking. There were able to help her up to standing and she was able to ambulate after the event. She is not taking any blood thinners.   Past Medical History:  Diagnosis Date  . Atrial fibrillation (HCC)    not a candidate for coumadin due to falls  . AV block, 1st degree   . CVD (cerebrovascular disease)    History of CVA  . GERD (gastroesophageal reflux disease)   . History of echocardiogram 9/11   normal EF, mild to mod MR & TR, mod pulmonary HTN  . Hypertension   . Hypothyroidism   . Osteoarthritis   . Pneumonia 09/11  . Prediabetes   . Syncope and collapse    Negative loop recorder in the past  . Traumatic enucleation of eye    left eye    Patient Active Problem List   Diagnosis Date Noted  . Palliative care encounter   . Goals of care, counseling/discussion   . Encounter for hospice care discussion   . Chest pain 02/15/2016  . Epigastric pain 02/15/2016  . Pancreatic mass 02/15/2016  . Hematoma 01/26/2016  . Hyponatremia 01/26/2016  . AKI (acute kidney injury) (HCC) 01/26/2016  . Cellulitis of left lower extremity 01/25/2016  . Orthostatic hypotension 01/04/2016  . Syncope and collapse 01/04/2016  . Syncope 01/04/2016  . Malnutrition of mild degree (HCC) 11/13/2015  . Vitamin D deficiency 08/30/2014  . Medication management 08/30/2014    . Hyperlipidemia 06/29/2013  . Prediabetes   . GERD (gastroesophageal reflux disease)   . Essential hypertension   . AV block, 1st degree   . CVD (cerebrovascular disease)   . Osteoarthritis   . Hypothyroidism   . Atrial fibrillation [facility](HCC)     Past Surgical History:  Procedure Laterality Date  . BACK SURGERY    . INGUINAL HERNIA REPAIR     left sided    OB History    No data available       Home Medications    Prior to Admission medications   Medication Sig Start Date End Date Taking? Authorizing Provider  acetaminophen (TYLENOL) 325 MG tablet Take 325-650 mg by mouth See admin instructions. 325 mg when Tramadol is taken; 650 mg as needed for headache or pain and NO Tramadol    Historical Provider, MD  aspirin 81 MG tablet Take 81 mg by mouth every evening.     Historical Provider, MD  beta carotene w/minerals (OCUVITE) tablet Take 1 tablet by mouth 2 (two) times daily.    Historical Provider, MD  carvedilol (COREG) 6.25 MG tablet Take 1 tablet (6.25 mg total) by mouth 2 (two) times daily with a meal. 02/19/16   Leroy SeaPrashant K Singh, MD  Cholecalciferol (VITAMIN D3) 1000 UNITS CAPS Take 1,000 Units by mouth 2 (two) times daily.     Historical Provider, MD  clotrimazole (LOTRIMIN)  1 % cream Apply to affected area 2 times daily 04/17/16   Arby Barrette, MD  docusate sodium (COLACE) 100 MG capsule Take 100 mg by mouth 2 (two) times daily as needed for mild constipation.  01/06/16   Historical Provider, MD  DULoxetine (CYMBALTA) 60 MG capsule Take 1 capsule (60 mg total) by mouth daily. Patient taking differently: Take 60 mg by mouth every morning.  02/06/16   Lucky Cowboy, MD  gabapentin (NEURONTIN) 100 MG capsule Take 1 capsule (100 mg total) by mouth 2 (two) times daily. Patient taking differently: Take 100 mg by mouth at bedtime.  02/01/16   Courtney Forcucci, PA-C  hydrocortisone (ANUSOL-HC) 2.5 % rectal cream Place 1 application rectally 2 (two) times daily. 04/11/16   Courtney  Forcucci, PA-C  lidocaine (LIDODERM) 5 % Place 1 patch onto the skin every 12 (twelve) hours. Remove & Discard patch within 12 hours or as directed by MD 02/19/16 02/18/17  Lucky Cowboy, MD  LORazepam (ATIVAN) 1 MG tablet Take 1 to 2 tablets every 4 hours PRN for agitation. Patient taking differently: Take 1-2 mg by mouth every 4 (four) hours as needed for anxiety (AGITATION).  03/04/16   Lucky Cowboy, MD  MAGNESIUM PO Take 1 tablet by mouth every morning.     Historical Provider, MD  ondansetron (ZOFRAN) 4 MG tablet Take 1 tablet (4 mg total) by mouth every 6 (six) hours. Patient taking differently: Take 4 mg by mouth every 6 (six) hours as needed for nausea.  06/28/14   Tilden Fossa, MD  pantoprazole (PROTONIX) 40 MG tablet Take 1 tablet (40 mg total) by mouth daily. Patient taking differently: Take 40 mg by mouth every morning.  02/19/16   Leroy Sea, MD  senna-docusate (SENOKOT-S) 8.6-50 MG tablet Take 1 tablet by mouth at bedtime as needed for mild constipation. 02/19/16   Leroy Sea, MD  simethicone (MYLICON) 80 MG chewable tablet Chew 1 tablet (80 mg total) by mouth every 4 (four) hours as needed for flatulence (Indigestion). 02/19/16   Leroy Sea, MD  SYNTHROID 75 MCG tablet TAKE 1 TABLET DAILY BEFORE BREAKFAST Patient taking differently: TAKE 1 TABLET (75 mcg) DAILY BEFORE BREAKFAST 10/09/15   Quentin Mulling, PA-C  traMADol (ULTRAM) 50 MG tablet TAKE 1 TABLET BY MOUTH 3-4 TIMES PER DAY AS NEEDED FOR PAIN Patient taking differently: TAKE 50 MG BY MOUTH NIGHTLY. MAY ALSO TAKE AS NEEDED FOR PAIN 02/14/16   Quentin Mulling, PA-C    Family History Family History  Problem Relation Age of Onset  . Heart attack Father   . Cancer Brother   . Stroke Brother   . Heart attack Brother     Social History Social History  Substance Use Topics  . Smoking status: Never Smoker  . Smokeless tobacco: Never Used  . Alcohol use No     Allergies   Augmentin [amoxicillin-pot  clavulanate]; Ciprocin-fluocin-procin [fluocinolone acetonide]; Ciprofloxacin hcl; Codeine; and Sulfa drugs cross reactors   Review of Systems Review of Systems   Physical Exam Updated Vital Signs BP 156/57   Pulse (!) 56   Temp 97.9 F (36.6 C) (Oral)   Resp 16   SpO2 100%   Physical Exam  Constitutional: She is oriented to person, place, and time. She appears well-developed and well-nourished.  HENT:  Head: Normocephalic.  Right Ear: External ear normal.  Left Ear: External ear normal.  Mouth/Throat: Oropharynx is clear and moist.  Bruising just lateral and superior to left eyebrow.  Eyes:  Prosthetic left eye. Right pupil equal, round reactive.   Neck: Normal range of motion. Neck supple.  Cardiovascular: Normal rate, regular rhythm, normal heart sounds and intact distal pulses.   No murmur heard. Pulmonary/Chest: Effort normal and breath sounds normal. No respiratory distress.  Abdominal: Soft. She exhibits no distension. There is no tenderness.  Musculoskeletal: Normal range of motion.  Right 4th finger with skin tear over posterior digit. Abrasion and bruising over superior left shoulder, minimally TTP over superior and anterior shoulder. Able to perform full ROM without difficulty.   Neurological: She is alert and oriented to person, place, and time.  Skin: Skin is warm and dry. Capillary refill takes less than 2 seconds.  Scattered bruises over extremities     ED Treatments / Results  Labs (all labs ordered are listed, but only abnormal results are displayed) Labs Reviewed - No data to display  EKG  EKG Interpretation None       Radiology Ct Head Wo Contrast  Result Date: 04/18/2016 CLINICAL DATA:  Left for 8 contusion after fall and syncope today. EXAM: CT HEAD WITHOUT CONTRAST TECHNIQUE: Contiguous axial images were obtained from the base of the skull through the vertex without intravenous contrast. COMPARISON:  CT scan of January 04, 2016. FINDINGS:  Brain: Minimal diffuse cortical atrophy is noted. Mild chronic ischemic white matter disease is noted. No mass effect or midline shift is noted. Ventricular size is within normal limits. There is no evidence of mass lesion, hemorrhage or acute infarction. Vascular: Atherosclerosis of carotid siphons is noted. Skull: Normal. Negative for fracture or focal lesion. Sinuses/Orbits: No acute finding. Other: None. IMPRESSION: Minimal diffuse cortical atrophy. Mild chronic ischemic white matter disease. No acute intracranial abnormality seen. Electronically Signed   By: Lupita RaiderJames  Green Jr, M.D.   On: 04/18/2016 19:34   Dg Shoulder Left  Result Date: 04/18/2016 CLINICAL DATA:  Larey SeatFell today landing on LEFT shoulder, LEFT shoulder pain, moderate in intensity, no additional injuries EXAM: LEFT SHOULDER - 2+ VIEW COMPARISON:  02/25/2014 FINDINGS: AC joint alignment normal. Diffuse osseous demineralization. No acute fracture, dislocation, or bone destruction. Visualized LEFT ribs intact. IMPRESSION: No acute osseous abnormalities. Electronically Signed   By: Ulyses SouthwardMark  Boles M.D.   On: 04/18/2016 15:37   Dg Abd Acute W/chest  Result Date: 04/17/2016 CLINICAL DATA:  Constipation for 3 weeks EXAM: DG ABDOMEN ACUTE W/ 1V CHEST COMPARISON:  02/15/2016, FINDINGS: AP semi-erect view of the chest demonstrates no large effusion. Stable mild cardiomegaly with prominent central vascularity. Irregular opacities in the right greater than left lung apices could relate to pulmonary scarring. Bibasilar streaky atelectasis without focal consolidation. Atherosclerosis of the aorta. No pneumothorax. Supine and decubitus views of the abdomen demonstrate no gross free air. Bowel gas pattern is non obstructed with mild stool in the colon. Calcified phleboliths in the right pelvis. Atherosclerotic vascular calcification of the aorta and within the pelvis. IMPRESSION: 1. Mild cardiomegaly with m prominent central pulmonary vessels but no gross edema  2. Bibasilar atelectasis and/or scarring 3. Nonobstructed bowel-gas pattern Electronically Signed   By: Jasmine PangKim  Fujinaga M.D.   On: 04/17/2016 18:38    Procedures .Marland Kitchen.Laceration Repair Date/Time: 04/19/2016 1:21 AM Performed by: Katrinka BlazingSMITH, Alyia Lacerte B Authorized by: Lake BellsSMITH, Valetta Mulroy B   Consent:    Consent obtained:  Verbal   Consent given by:  Patient Anesthesia (see MAR for exact dosages):    Anesthesia method:  None Laceration details:    Location:  Finger   Finger location:  R ring finger   Length (cm):  2 Repair type:    Repair type:  Simple Treatment:    Amount of cleaning:  Standard   Irrigation solution:  Sterile saline Skin repair:    Repair method:  Tissue adhesive Approximation:    Approximation:  Loose Post-procedure details:    Dressing:  Open (no dressing)   Patient tolerance of procedure:  Tolerated well, no immediate complications   (including critical care time)  Medications Ordered in ED Medications - No data to display   Initial Impression / Assessment and Plan / ED Course  I have reviewed the triage vital signs and the nursing notes.  Pertinent labs & imaging results that were available during my care of the patient were reviewed by me and considered in my medical decision making (see chart for details).  Clinical Course    Patient is a 82 year old female who presents after a fall described above. Afebrile, VSS on presentation. Exam with bruising of her left shoulder and left forehead as described above. She able to perform full range of motion of left shoulder. Patient has superficial laceration over her right ring finger that was irrigated and repaired with Dermabond as described above. Imaging of left shoulder is negative for fracture. CT head obtained given signs of head injury on secondary and is negative for bleed or fracture. Family expresses concern for patient's multiple falls. ED case manager was consulted. Will contact hospice to set up home health safety  evaluation and further follow-up as needed. Patient remained stable on multiple re-evaluations. She was discharged in stable condition. Advised to use walker at all times for ambulation. Will follow up with primary care doctor for reevaluation and as per case manager.  Patient was seen and discussed with Dr. Hyacinth Meeker, ED attending   Final Clinical Impressions(s) / ED Diagnoses   Final diagnoses:  Contusion of face, initial encounter  Fall, initial encounter  Laceration of right ring finger without foreign body without damage to nail, initial encounter    New Prescriptions Discharge Medication List as of 04/18/2016  8:44 PM       Isa Rankin, MD 04/19/16 7829    Eber Hong, MD 04/19/16 1438

## 2016-04-18 NOTE — Care Management (Signed)
ED CM consulted concerning recommendations for [facility] services. Patient presented to [facility] ED s/p mechanical fall. CM reviewed patient's record, patient is active with [facility].  CM met with patient and family at bedside, confirmed information. They report Home Hospice Nurse visits every Tuesday. CM discussed with family that patient is unable to receive services from [facility] according to Insurance guidelines.CM contacted [facility] on-call nurse she states a visit will be scheduled for tomorrow 11/25 .  Family verbalized understanding teach back done. No further CM needs

## 2016-04-18 NOTE — ED Notes (Signed)
Patient transported to CT 

## 2016-04-18 NOTE — ED Notes (Signed)
Completed wound care on laceration of right fourth finger.

## 2016-04-20 LAB — POC OCCULT BLOOD, ED: Fecal Occult Bld: POSITIVE — AB

## 2016-04-21 ENCOUNTER — Other Ambulatory Visit: Payer: Self-pay | Admitting: *Deleted

## 2016-04-21 MED ORDER — PANTOPRAZOLE SODIUM 40 MG PO TBEC: 40.0000 mg | DELAYED_RELEASE_TABLET | Freq: Every day | ORAL | 1 refills | Status: AC

## 2016-04-23 ENCOUNTER — Ambulatory Visit (INDEPENDENT_AMBULATORY_CARE_PROVIDER_SITE_OTHER): Admitting: Internal Medicine

## 2016-04-23 ENCOUNTER — Encounter: Payer: Self-pay | Admitting: Internal Medicine

## 2016-04-23 VITALS — BP 168/64 | HR 60 | Temp 97.6°F | Resp 16 | Ht 62.0 in | Wt 112.0 lb

## 2016-04-23 DIAGNOSIS — S0010XA Contusion of unspecified eyelid and periocular area, initial encounter: Secondary | ICD-10-CM | POA: Diagnosis not present

## 2016-04-23 DIAGNOSIS — S40012D Contusion of left shoulder, subsequent encounter: Secondary | ICD-10-CM

## 2016-04-23 NOTE — Progress Notes (Signed)
[facility]   Lucky CowboyWilliam Sanuel Ladnier, M.D.    Dyanne CarrelAmanda R. Steffanie Dunnollier, P.A.-C      Terri Piedraourtney Forcucci, P.A.-C  [facility]                [address]                [place], [place] [id number] Telephone [phone]) [phone] Telefax [phone] Subjective:    Patient ID: [name], female    DOB: 03/06/1915, 82 y.o.   MRN: [id number]  HPI   This very nice 82 yo WWF with HTN, ASHD/cAfib, pancreatic Mass and failure to Thrive  Is now followed on Hospice services and was seen in the ER on 04/17/2016 for rectal bleeding  and then after tripping at home and falling she was taken back to the ER on 04/18/2016 for a head contusion with negative Head CT scan and also had XR's of the L shoulder that were negative for fx.     Patient has done well in in the interim w/o mental status changes.   Medication Sig  . acetaminophen (TYLENOL) 325 MG tablet Take 325-650 mg by mouth See admin instructions. 325 mg when Tramadol is taken; 650 mg as needed for headache or pain and NO Tramadol  . aspirin 81 MG tablet Take 81 mg by mouth every evening.   . beta carotene w/minerals (OCUVITE) tablet Take 1 tablet by mouth 2 (two) times daily.  . carvedilol (COREG) 6.25 MG tablet Take 1 tablet (6.25 mg total) by mouth 2 (two) times daily with a meal.  . Cholecalciferol (VITAMIN D3) 1000 UNITS CAPS Take 1,000 Units by mouth 2 (two) times daily.   . clotrimazole (LOTRIMIN) 1 % cream Apply to affected area 2 times daily  . docusate sodium (COLACE) 100 MG capsule Take 100 mg by mouth 2 (two) times daily as needed for mild constipation.   . DULoxetine (CYMBALTA) 60 MG capsule Take 1 capsule (60 mg total) by mouth daily. (Patient taking differently: Take 60 mg by mouth every morning. )  . gabapentin (NEURONTIN) 100 MG capsule Take 1 capsule (100 mg total) by mouth 2 (two) times daily. (Patient taking differently: Take 100 mg by mouth at bedtime. )  . hydrocortisone  (ANUSOL-HC) 2.5 % rectal cream Place 1 application rectally 2 (two) times daily.  Marland Kitchen. lidocaine (LIDODERM) 5 % Place 1 patch onto the skin every 12 (twelve) hours. Remove & Discard patch within 12 hours or as directed by MD  . LORazepam (ATIVAN) 1 MG tablet Take 1 to 2 tablets every 4 hours PRN for agitation. (Patient taking differently: Take 1-2 mg by mouth every 4 (four) hours as needed for anxiety (AGITATION). )  . MAGNESIUM PO Take 1 tablet by mouth every morning.   . ondansetron (ZOFRAN) 4 MG tablet Take 1 tablet (4 mg total) by mouth every 6 (six) hours. (Patient taking differently: Take 4 mg by mouth every 6 (six) hours as needed for nausea. )  . pantoprazole (PROTONIX) 40 MG tablet Take 1 tablet (40 mg total) by mouth daily.  Marland Kitchen. senna-docusate (SENOKOT-S) 8.6-50 MG tablet Take 1 tablet by mouth at bedtime as needed for mild constipation.  . simethicone (MYLICON) 80 MG chewable tablet Chew 1 tablet (80 mg total) by mouth every 4 (four) hours as needed for flatulence (Indigestion).  . SYNTHROID 75 MCG tablet TAKE 1 TABLET DAILY BEFORE BREAKFAST (Patient taking differently: TAKE 1 TABLET (75 mcg) DAILY BEFORE BREAKFAST)  .  traMADol (ULTRAM) 50 MG tablet TAKE 1 TABLET BY MOUTH 3-4 TIMES PER DAY AS NEEDED FOR PAIN (Patient taking differently: TAKE 50 MG BY MOUTH NIGHTLY. MAY ALSO TAKE AS NEEDED FOR PAIN)   Allergies  Allergen Reactions  . Augmentin [Amoxicillin-Pot Clavulanate] Diarrhea  . Ciprocin-Fluocin-Procin [Fluocinolone Acetonide] Nausea And Vomiting  . Ciprofloxacin Hcl Nausea And Vomiting  . Codeine Hives and Nausea And Vomiting  . Sulfa Drugs Cross Reactors Hives and Nausea And Vomiting   Past Medical History:  Diagnosis Date  . Atrial fibrillation (HCC)    not a candidate for coumadin due to falls  . AV block, 1st degree   . CVD (cerebrovascular disease)    History of CVA  . GERD (gastroesophageal reflux disease)   . History of echocardiogram 9/11   normal EF, mild to mod MR &  TR, mod pulmonary HTN  . Hypertension   . Hypothyroidism   . Osteoarthritis   . Pneumonia 09/11  . Prediabetes   . Syncope and collapse    Negative loop recorder in the past  . Traumatic enucleation of eye    left eye   Review of Systems  10 point systems review negative except as above.    Objective:   Physical Exam  BP (!) 168/64   Pulse 60   Temp 97.6 F (36.4 C)   Resp 16   Ht 5\' 2"  (1.575 m)   Wt 112 lb (50.8 kg)   BMI 20.49 kg/m   Alert & in no distress. Ecchymoses noted in the L frontotemporal area. Wastede.   HEENT - Eac's patent. TM's Nl. EOM's full. PERRLA. NasoOroPharynx clear. Neck - supple. Nl Thyroid. Carotids 2+ & No bruits, nodes, JVD Chest - Clear. Cor - Nl HS. RRR w/o sig MGR. PP 1(+).  Abd - Soft. MS- FROM w/o deformities. Muscle power, tone and bulkdecreased. In Wheelchair.  Neuro - No obvious Cr N abnormalities. Sensory, motor and Cerebellar functions appear Nl w/o focal abnormalities.   Assessment & Plan:   1. Contusion of periocular region   2. Contusion of left shoulder  - advised daughter continued observation to maintain comfort.  Over 20 minutes of exam, counseling, chart review and decision making was performed

## 2016-05-09 ENCOUNTER — Other Ambulatory Visit: Payer: Self-pay | Admitting: Internal Medicine

## 2016-05-09 ENCOUNTER — Other Ambulatory Visit: Payer: Self-pay | Admitting: *Deleted

## 2016-05-09 MED ORDER — FUROSEMIDE 40 MG PO TABS: 40.0000 mg | ORAL_TABLET | Freq: Every day | ORAL | 3 refills | Status: DC

## 2016-05-09 NOTE — Telephone Encounter (Signed)
Hospice nurse called stating patient's legs are very swollen.  Per Dr. Oneta RackMcKeown, Lasix 40 mg 1 QD Rx sent into pharmacy and Hospice nurse aware.

## 2016-05-12 ENCOUNTER — Encounter: Payer: Self-pay | Admitting: Physician Assistant

## 2016-06-05 ENCOUNTER — Encounter: Payer: Self-pay | Admitting: Physician Assistant

## 2016-06-05 ENCOUNTER — Ambulatory Visit (INDEPENDENT_AMBULATORY_CARE_PROVIDER_SITE_OTHER): Payer: Medicare Other | Admitting: Physician Assistant

## 2016-06-05 VITALS — BP 132/70 | HR 83 | Temp 97.7°F | Resp 14 | Ht 62.0 in | Wt 115.0 lb

## 2016-06-05 DIAGNOSIS — E441 Mild protein-calorie malnutrition: Secondary | ICD-10-CM

## 2016-06-05 DIAGNOSIS — I10 Essential (primary) hypertension: Secondary | ICD-10-CM | POA: Diagnosis not present

## 2016-06-05 DIAGNOSIS — M1990 Unspecified osteoarthritis, unspecified site: Secondary | ICD-10-CM

## 2016-06-05 DIAGNOSIS — E782 Mixed hyperlipidemia: Secondary | ICD-10-CM

## 2016-06-05 DIAGNOSIS — Z0000 Encounter for general adult medical examination without abnormal findings: Secondary | ICD-10-CM

## 2016-06-05 DIAGNOSIS — N183 Chronic kidney disease, stage 3 unspecified: Secondary | ICD-10-CM

## 2016-06-05 DIAGNOSIS — I679 Cerebrovascular disease, unspecified: Secondary | ICD-10-CM

## 2016-06-05 DIAGNOSIS — E039 Hypothyroidism, unspecified: Secondary | ICD-10-CM | POA: Diagnosis not present

## 2016-06-05 DIAGNOSIS — I700 Atherosclerosis of aorta: Secondary | ICD-10-CM

## 2016-06-05 DIAGNOSIS — K8689 Other specified diseases of pancreas: Secondary | ICD-10-CM

## 2016-06-05 DIAGNOSIS — I482 Chronic atrial fibrillation, unspecified: Secondary | ICD-10-CM

## 2016-06-05 DIAGNOSIS — E559 Vitamin D deficiency, unspecified: Secondary | ICD-10-CM

## 2016-06-05 DIAGNOSIS — R55 Syncope and collapse: Secondary | ICD-10-CM

## 2016-06-05 DIAGNOSIS — K219 Gastro-esophageal reflux disease without esophagitis: Secondary | ICD-10-CM

## 2016-06-05 DIAGNOSIS — I440 Atrioventricular block, first degree: Secondary | ICD-10-CM

## 2016-06-05 DIAGNOSIS — Z79899 Other long term (current) drug therapy: Secondary | ICD-10-CM

## 2016-06-05 DIAGNOSIS — E871 Hypo-osmolality and hyponatremia: Secondary | ICD-10-CM | POA: Diagnosis not present

## 2016-06-05 DIAGNOSIS — I739 Peripheral vascular disease, unspecified: Secondary | ICD-10-CM

## 2016-06-05 LAB — BASIC METABOLIC PANEL WITH GFR
BUN: 22 mg/dL (ref 7–25)
CALCIUM: 9.3 mg/dL (ref 8.6–10.4)
CO2: 27 mmol/L (ref 20–31)
CREATININE: 0.99 mg/dL — AB (ref 0.60–0.88)
Chloride: 98 mmol/L (ref 98–110)
GFR, EST AFRICAN AMERICAN: 54 mL/min — AB (ref >=60)
GFR, EST NON AFRICAN AMERICAN: 47 mL/min — AB (ref >=60)
GLUCOSE: 103 mg/dL — AB (ref 65–99)
Potassium: 4.3 mmol/L (ref 3.5–5.3)
Sodium: 135 mmol/L (ref 135–146)

## 2016-06-05 LAB — CBC WITH DIFFERENTIAL/PLATELET
BASOS ABS: 72 {cells}/uL (ref 0–200)
Basophils Relative: 1 %
EOS PCT: 8 %
Eosinophils Absolute: 576 cells/uL — ABNORMAL HIGH (ref 15–500)
HEMATOCRIT: 34 % — AB (ref 35.0–45.0)
HEMOGLOBIN: 10.9 g/dL — AB (ref 11.7–15.5)
LYMPHS ABS: 1368 {cells}/uL (ref 850–3900)
LYMPHS PCT: 19 %
MCH: 29.1 pg (ref 27.0–33.0)
MCHC: 32.1 g/dL (ref 32.0–36.0)
MCV: 90.7 fL (ref 80.0–100.0)
MONO ABS: 1152 {cells}/uL — AB (ref 200–950)
MPV: 9.2 fL (ref 7.5–12.5)
Monocytes Relative: 16 %
NEUTROS PCT: 56 %
Neutro Abs: 4032 cells/uL (ref 1500–7800)
Platelets: 270 10*3/uL (ref 140–400)
RBC: 3.75 MIL/uL — AB (ref 3.80–5.10)
RDW: 15 % (ref 11.0–15.0)
WBC: 7.2 10*3/uL (ref 3.8–10.8)

## 2016-06-05 LAB — HEPATIC FUNCTION PANEL
ALBUMIN: 4 g/dL (ref 3.6–5.1)
ALT: 14 U/L (ref 6–29)
AST: 25 U/L (ref 10–35)
Alkaline Phosphatase: 83 U/L (ref 33–130)
BILIRUBIN INDIRECT: 0.6 mg/dL (ref 0.2–1.2)
Bilirubin, Direct: 0.2 mg/dL (ref <=0.2)
TOTAL PROTEIN: 6.7 g/dL (ref 6.1–8.1)
Total Bilirubin: 0.8 mg/dL (ref 0.2–1.2)

## 2016-06-05 LAB — TSH: TSH: 6.93 m[IU]/L — AB

## 2016-06-05 NOTE — Patient Instructions (Signed)
About Your Loved One's Last Days How will my loved one be cared for?   Talk with your loved one about how he or she would like to be cared for in his or her last days. Care options may include:  Home care. Many people choose to die in their own home or in the home of a family member. This may require family members to take on the role of caregivers.  Hospice care. Hospice is a service that is designed to provide medical, spiritual, and psychological support to people who are terminally ill and their families. Hospice care can take place in a variety of settings, including at home.  Hospital care. Some people prefer the comfort of having nurses and doctors nearby at all times.  Nursing home care. Nursing homes have medical staff on duty at all times.  Spiritual care. A priest, minister, or spiritual leader can be included as a member of the caregiving team to provide guidance and counseling along the way. You may choose to combine several care options. What changes will I see in my loved one? Your loved one will go through some changes when death is near. He or she may:  Sleep more.  Urinate less and have fewer bowel movements.  Have mucus in the mouth.  Feel colder and look bluer in color.  Be restless and behave unusually. For example, he or she may pull on the sheets, talk to people who are not in the room, or talk to people who have already died.  Have breathing changes. Breathing may not be as deep, or it may become deeper and sound like snoring. There may be as many as 30 seconds between breaths. Breathing changes may come and go. How can I help? Here are some things that you can do to keep your loved one comfortable:  Keep a light on in your loved one's room. He or she may not be able to see well when awake.  Wait until your loved one wakes up to give any needed medicine.  Do not force your loved one to eat, drink, or take medicine.  If your loved one has thick mucus in  the throat, it may help to raise his or her head and shoulders on pillows.  Put bed pads under your loved one. Change them when needed to keep them clean and dry.  If your loved one talks to people who are not there, do not try to correct him or her.  Comfort your loved one if he or she seems scared.  Call a health care provider if your loved one expresses having a need to urinate and being unable to do so.  Keep your loved one's mouth moist for comfort. To do this you may:  Offer chips of ice if he or she is still able to chew.  Put petroleum jelly on his or her lips to keep them from being too dry.  Gently wipe out his or her mouth with wet mouth sponges. You can get these sponges from your health care provider. What happens at the time of death? At the time of death:  Breathing stops.  The heart stops beating.  The eyes may be partly open.  The mouth may drop open a little. These are all natural occurrences. Feelings of grief or relief at this time are normal. Talk with your caregiving team about any questions or needs you have. This information is not intended to replace advice given to you by your  health care provider. Make sure you discuss any questions you have with your health care provider. Document Released: 04/30/2009 Document Revised: 02/20/2016 Document Reviewed: 04/19/2014 Elsevier Interactive Patient Education  2017 [employer]   Alzheimer Disease Caregiver Guide Alzheimer disease is an illness that affects a person's brain. It causes a person to lose the ability to remember things and make good decisions. As the disease progresses, the person is unable to take care of himself or herself and needs more and more help to do simple tasks. Taking care of someone with Alzheimer disease can be very challenging and overwhelming. Memory loss and confusion Memory loss and confusion is mild in the beginning stages of the disease. Both of these problems become more severe  as the disease progresses. Eventually, the person will not recognize places or even close family members and friends.  Stay calm.  Respond with a short explanation. Long explanations can be overwhelming and confusing.  Avoid corrections that sound like scolding.  Try not to take it personally, even if the person forgets your name. Behavior changes Behavior changes are part of the disease. The person may develop depression, anxiety, anger, hallucinations, or other behavior changes. These changes can come on suddenly and may be in response to pain, infection, changes in the environment (temperature, noise), overstimulation, or feeling lost or scared.  Try not to take behavior changes personally.  Remain calm and patient.  Do not argue or try to convince the person about a specific point. This will only make him or her more agitated.  Know that the behavior changes are part of the disease process and try to work through it. Tips to reduce frustration  Schedule wisely by making appointments and doing daily tasks, like bathing and dressing, when the person is at his or her best.  Take your time. Simple tasks may take a lot longer, so be sure to allow for plenty of time.  Limit choices. Too many choices can be overwhelming and stressful for the person.  Involve the person in what you are doing.  Stick to a routine.  Avoid new or crowded situations, if possible.  Use simple words, short sentences, and a calm voice. Only give one direction at a time.  Buy clothes and shoes that are easy to put on and take off.  Let people help if they offer. Home safety Keeping the home safe is very important to reduce the risk of falls and injuries.  Keep floors clear of clutter. Remove rugs, magazine racks, and floor lamps.  Keep hallways well lit.  Put a handrail and nonslip mat in the bathtub or shower.  Put childproof locks on cabinets with dangerous items, such as medicine, alcohol, guns,  toxic cleaning items, sharp tools or utensils, matches, or lighters.  Place locks on doors where the person cannot easily see or reach them. This helps ensure that the person cannot wander out of the house and get lost.  Be prepared for emergencies. Keep a list of emergency phone numbers and addresses in a convenient area. Plans for the future  Do not put off talking about finances.  Talk about money management. People with Alzheimer disease have trouble managing their money as the disease gets worse.  Get help from professional advisors regarding financial and legal matters.  Do not put off talking about future care.  Choose a power of attorney. This is someone who can make decisions for the person with Alzheimer disease when he or she is no longer  able to do so.  Talk about driving and when it is the right time to stop. The person's health care provider can help give advice on this matter.  Talk about the person's living situation. If he or she lives alone, you need to make sure he or she is safe. Some people need extra help at home, and others need more care at a nursing home or care center. Support groups Joining a support group can be very helpful for caregivers of people with Alzheimer disease. Some advantages to being part of a support group include:  Getting strategies to manage stress.  Sharing experiences with others.  Receiving emotional comfort and support.  Learning new caregiving skills as the disease progresses.  Knowing what community resources are available and taking advantage of them. Contact a health care provider if:  The person has a fever.  The person has a sudden change in behavior that does not improve with calming strategies.  The person is unable to manage in his or her current living situation.  The person threatens you or anyone else, including himself or herself.  You are no longer able to care for the person. This information is not intended  to replace advice given to you by your health care provider. Make sure you discuss any questions you have with your health care provider. Document Released: 01/22/2004 Document Revised: 10/24/2015 Document Reviewed: 06/18/2011 Elsevier Interactive Patient Education  2017 [employer].

## 2016-06-05 NOTE — Progress Notes (Signed)
MEDICARE ANNUAL WELLNESS VISIT AND FOLLOW UP  Assessment:   Chronic atrial fibrillation Continue cardio follow up, no coumadin due to fall risk/age  CVD (cerebrovascular disease) Control blood pressure, cholesterol, glucose within reason for age, patient and daughter understand increased risk of CVA   AV block, 1st degree  monitor  Essential hypertension Monitor, will not have strict BP control due to fall risk, explained to patient and daughter.  - CBC with Differential   Gastroesophageal reflux disease without esophagitis Diet controlled  Hypothyroidism, unspecified hypothyroidism type Hypothyroidism-check TSH level, continue medications the same, reminded to take on an empty stomach 30-6260mins before food.  - TSH   Prediabetes Discussed general issues about diabetes pathophysiology and management., Educational material distributed., Suggested low cholesterol diet., Encouraged aerobic exercise., Discussed foot care., Reminded to get yearly retinal exam.  Hyperlipidemia - Lipid panel   Encounter for long-term (current) use of medications - Magnesium   Vitamin D deficiency - Vit D  25 hydroxy (rtn osteoporosis monitoring  Osteoarthritis, unspecified osteoarthritis type, unspecified site Monitor pain level, continue tramadol as needed, discussed fall risk versus benefit and patient benefits from medication  Malnutrition of mild degree (HCC) Add ensure   Peripheral vascular disease (HCC) Monitor for ulcer, ulcer has healed  CKD (chronic kidney disease) stage 3, GFR 30-59 ml/min -     BASIC METABOLIC PANEL WITH GFR  Aortic atherosclerosis (HCC) Control blood pressure, cholesterol, glucose, increase exercise.   Syncope, unspecified syncope type monitor  Pancreatic mass In hospice  Vitamin D deficiency  Hyponatremia -     BASIC METABOLIC PANEL WITH GFR    Future Appointments Date Time Provider Department Center  06/05/2016 3:00 PM Anne MullingAmanda Collier, PA-C  [facility] None  06/18/2016 1:30 PM Anne MacadamiaLori C Gerhardt, NP [facility]  06/10/2017 3:00 PM Anne MullingAmanda Collier, PA-C [facility] None     Subjective:   [name] is a 83 y.o. female who presents for CPE and 3 month follow up on hypertension, prediabetes, hyperlipidemia, vitamin D def.   Son died last year, daughter [name]. She has DNR and is currently in hospice fore failure to thrive and pancreatic mass and staying at home.  History provided by daughter, states she sleeps a lot. She does walk around the house some with the walker with someone behind, she needs help with ADLS, she needs help with bathing, dressing, medications, but she is still able to feed herself.  She is difficult of hearing, she has hearing aids but does not use them him.  She has lower back pain, takes tramadol for it, this helps some takes at night, very rarely takes ativan but it makes her sleep.  Her blood pressure has been controlled at home, on coreg twice a day has not taken the lasix, has stopped taking BP at home, today their BP is BP: 132/70  She does not workout. She denies chest pain, shortness of breath, dizziness. She has chronic afib, is not on anticoagulant due to fall risk. Follows with Dr. Patty SermonsBrackbill.  She has PVD and had ulcer on left shin but has healed well, she elevated her feet and does not where compression stockings anymore.   She is not on cholesterol medication and denies myalgias. Her cholesterol is at goal. The cholesterol last visit was:   Lab Results  Component Value Date   CHOL 197 08/13/2015   HDL 87 08/13/2015   LDLCALC 96 08/13/2015   TRIG 70 08/13/2015   CHOLHDL 2.3 08/13/2015    She has been  working on diet and exercise for prediabetes, and denies polydipsia, polyuria and visual disturbances. Last A1C in the office was:  Lab Results  Component Value Date   HGBA1C 5.6 08/13/2015   Lab Results  Component Value Date   GFRNONAA 47 (L) 04/17/2016   Patient is on Vitamin D  supplement.   Lab Results  Component Value Date   VD25OH 45 02/06/2016     She is on thyroid medication. Her medication was not changed last visit.   Lab Results  Component Value Date   TSH 2.66 02/06/2016  .  BMI is Body mass index is 21.03 kg/m., she is working on gaining weight, on ensure Wt Readings from Last 3 Encounters:  06/05/16 115 lb (52.2 kg)  04/23/16 112 lb (50.8 kg)  04/11/16 114 lb (51.7 kg)   Medication Review Current Outpatient Prescriptions on File Prior to Visit  Medication Sig Dispense Refill  . acetaminophen (TYLENOL) 325 MG tablet Take 325-650 mg by mouth See admin instructions. 325 mg when Tramadol is taken; 650 mg as needed for headache or pain and NO Tramadol    . aspirin 81 MG tablet Take 81 mg by mouth every evening.     . beta carotene w/minerals (OCUVITE) tablet Take 1 tablet by mouth 2 (two) times daily.    . carvedilol (COREG) 6.25 MG tablet TAKE 1 TABLET BY MOUTH TWICE A DAY WITH MEALS 180 tablet 1  . Cholecalciferol (VITAMIN D3) 1000 UNITS CAPS Take 1,000 Units by mouth 2 (two) times daily.     . clotrimazole (LOTRIMIN) 1 % cream Apply to affected area 2 times daily 15 g 0  . docusate sodium (COLACE) 100 MG capsule Take 100 mg by mouth 2 (two) times daily as needed for mild constipation.   0  . DULoxetine (CYMBALTA) 60 MG capsule Take 1 capsule (60 mg total) by mouth daily. (Patient taking differently: Take 60 mg by mouth every morning. ) 30 capsule 11  . furosemide (LASIX) 40 MG tablet Take 1 tablet (40 mg total) by mouth daily. 30 tablet 3  . gabapentin (NEURONTIN) 100 MG capsule Take 1 capsule (100 mg total) by mouth 2 (two) times daily. (Patient taking differently: Take 100 mg by mouth at bedtime. ) 60 capsule 2  . hydrocortisone (ANUSOL-HC) 2.5 % rectal cream Place 1 application rectally 2 (two) times daily. 30 g 0  . lidocaine (LIDODERM) 5 % Place 1 patch onto the skin every 12 (twelve) hours. Remove & Discard patch within 12 hours or as directed  by MD 30 patch 5  . LORazepam (ATIVAN) 1 MG tablet Take 1 to 2 tablets every 4 hours PRN for agitation. (Patient taking differently: Take 1-2 mg by mouth every 4 (four) hours as needed for anxiety (AGITATION). ) 90 tablet 0  . MAGNESIUM PO Take 1 tablet by mouth every morning.     . ondansetron (ZOFRAN) 4 MG tablet Take 1 tablet (4 mg total) by mouth every 6 (six) hours. (Patient taking differently: Take 4 mg by mouth every 6 (six) hours as needed for nausea. ) 12 tablet 0  . pantoprazole (PROTONIX) 40 MG tablet Take 1 tablet (40 mg total) by mouth daily. 30 tablet 1  . senna-docusate (SENOKOT-S) 8.6-50 MG tablet Take 1 tablet by mouth at bedtime as needed for mild constipation. 30 tablet 0  . simethicone (MYLICON) 80 MG chewable tablet Chew 1 tablet (80 mg total) by mouth every 4 (four) hours as needed for flatulence (Indigestion). 30 tablet  0  . SYNTHROID 75 MCG tablet TAKE 1 TABLET DAILY BEFORE BREAKFAST (Patient taking differently: TAKE 1 TABLET (75 mcg) DAILY BEFORE BREAKFAST) 90 tablet 0  . traMADol (ULTRAM) 50 MG tablet TAKE 1 TABLET BY MOUTH 3-4 TIMES PER DAY AS NEEDED FOR PAIN (Patient taking differently: TAKE 50 MG BY MOUTH NIGHTLY. MAY ALSO TAKE AS NEEDED FOR PAIN) 100 tablet 0   No current facility-administered medications on file prior to visit.     Allergies: Allergies  Allergen Reactions  . Augmentin [Amoxicillin-Pot Clavulanate] Diarrhea  . Ciprocin-Fluocin-Procin [Fluocinolone Acetonide] Nausea And Vomiting  . Ciprofloxacin Hcl Nausea And Vomiting  . Codeine Hives and Nausea And Vomiting  . Sulfa Drugs Cross Reactors Hives and Nausea And Vomiting    Current Problems (verified) Patient Active Problem List   Diagnosis Date Noted  . Aortic atherosclerosis (HCC) 06/05/2016  . Palliative care encounter   . Goals of care, counseling/discussion   . Encounter for hospice care discussion   . Pancreatic mass 02/15/2016  . Hyponatremia 01/26/2016  . Cellulitis of left lower  extremity 01/25/2016  . Orthostatic hypotension 01/04/2016  . Syncope 01/04/2016  . Malnutrition of mild degree (HCC) 11/13/2015  . Vitamin D deficiency 08/30/2014  . Medication management 08/30/2014  . Hyperlipidemia 06/29/2013  . Prediabetes   . GERD (gastroesophageal reflux disease)   . Essential hypertension   . AV block, 1st degree   . CVD (cerebrovascular disease)   . Osteoarthritis   . Hypothyroidism   . Atrial fibrillation (HCC)     Screening Tests Immunization History  Administered Date(s) Administered  . Influenza Split 02/03/2013  . Influenza, High Dose Seasonal PF 02/22/2014, 02/07/2015, 02/06/2016  . Pneumococcal Conjugate-13 04/11/2014  . Pneumococcal-Unspecified 05/26/2009  . Td 03/23/2013  . Zoster 05/26/2005   Preventative care: Last colonoscopy: 2009 Last mammogram: remote, declines due to age Last pap smear/pelvic exam: remote, declines due to age  DEXA: declines due to age CT head 06/2014 CT AB 02/2014 Echo 2013 CXR 02/2014  Prior vaccinations: TD or Tdap: 2014  Influenza: 2017 Pneumococcal: 2011 Prevnar13: 2015 Shingles/Zostavax: 2007  Names of Other Physician/Practitioners you currently use: 1. [facility]- here for primary care 2. Dr. Hyacinth Meeker, eye doctor, last visit Sept 2015 3. None, dentures, dentist Patient Care Team: Lucky Cowboy, MD as PCP - General (Internal Medicine) Bernette Redbird, MD as Consulting Physician (Gastroenterology) Cassell Clement, MD as Consulting Physician (Cardiology) Blima Ledger, OD (Optometry) [facility] as Registered Nurse [facility] and Palliative Medicine)  Surgical: She  has a past surgical history that includes Back surgery and Inguinal hernia repair. Family  Patient's family history includes Cancer in her brother; Heart attack in her brother and father; Stroke in her brother. Social history  She reports that she has never smoked. She has never used  smokeless tobacco. She reports that she does not drink alcohol or use drugs.  Review of Systems  Constitutional: Negative for chills, fever and malaise/fatigue.  HENT: Positive for hearing loss. Negative for congestion, ear discharge, ear pain, sore throat and tinnitus.   Respiratory: Negative for cough, shortness of breath, wheezing and stridor.   Cardiovascular: Negative for chest pain, palpitations and leg swelling.  Gastrointestinal: Positive for heartburn (occasionally) and nausea (with tramadol). Negative for abdominal pain, blood in stool, constipation, diarrhea, melena and vomiting.  Genitourinary: Negative.   Musculoskeletal: Positive for back pain, joint pain and myalgias. Negative for falls and neck pain.  Skin: Negative.   Neurological: Negative for sensory  change, loss of consciousness and headaches.  Psychiatric/Behavioral: Negative for depression. The patient is not nervous/anxious and does not have insomnia.     Objective:   Today's Vitals   06/05/16 1407  BP: 132/70  Pulse: 83  Resp: 14  Temp: 97.7 F (36.5 C)  SpO2: 96%  Weight: 115 lb (52.2 kg)  Height: 5\' 2"  (1.575 m)   Body mass index is 21.03 kg/m. History of daughter General appearance: alert, no distress, WD/WN,  female HEENT: normocephalic, sclerae anicteric, TMs pearly, nares patent, no discharge or erythema, pharynx normal, decreased hearing Oral cavity: MMM, no lesions Neck: supple, no lymphadenopathy, no thyromegaly, no masses Heart: Irreg irreg, normal S1, S2, with holosystolic 2/6 murmur Lungs: CTA bilaterally, slight decreased breath sound right lower base, no wheezes, rhonchi, or rales Abdomen: +bs, soft, non tender, non distended, no masses, no hepatomegaly, no splenomegaly Musculoskeletal: nontender, no swelling, no obvious deformity Extremities: well healed ulcer on left anterior shin, 1+  edema, no cyanosis, no clubbing Pulses: 2+ symmetric upper extremities and decrease bilateral lower  extremities, normal cap refill Neurological: alert, oriented x 3, CN2-12 intact, 4/5 strength upper extremities and lower extremities, DTRs 2+ throughout, no cerebellar signs, in wheelchair Psychiatric: patient will respond to questioning, oriented x 1 Breast: defer Gyn: defer Rectal: defer      Anne Mulling, PA-C   06/05/2016

## 2016-06-06 NOTE — Progress Notes (Signed)
Pt's caregiver aware of lab results & voiced understanding of those results.

## 2016-06-06 NOTE — Progress Notes (Signed)
LVM for pt to return office call for LAB results.

## 2016-06-10 IMAGING — CR DG ELBOW COMPLETE 3+V*L*
4 series · 4 of 4 positions shown · non-contrast
Comparison: None.

CLINICAL DATA: Fall onto floor this morning landing on left
shoulder and elbow with resulting pain.

EXAM:
LEFT ELBOW - COMPLETE 3+ VIEW

[x elbow ap left]
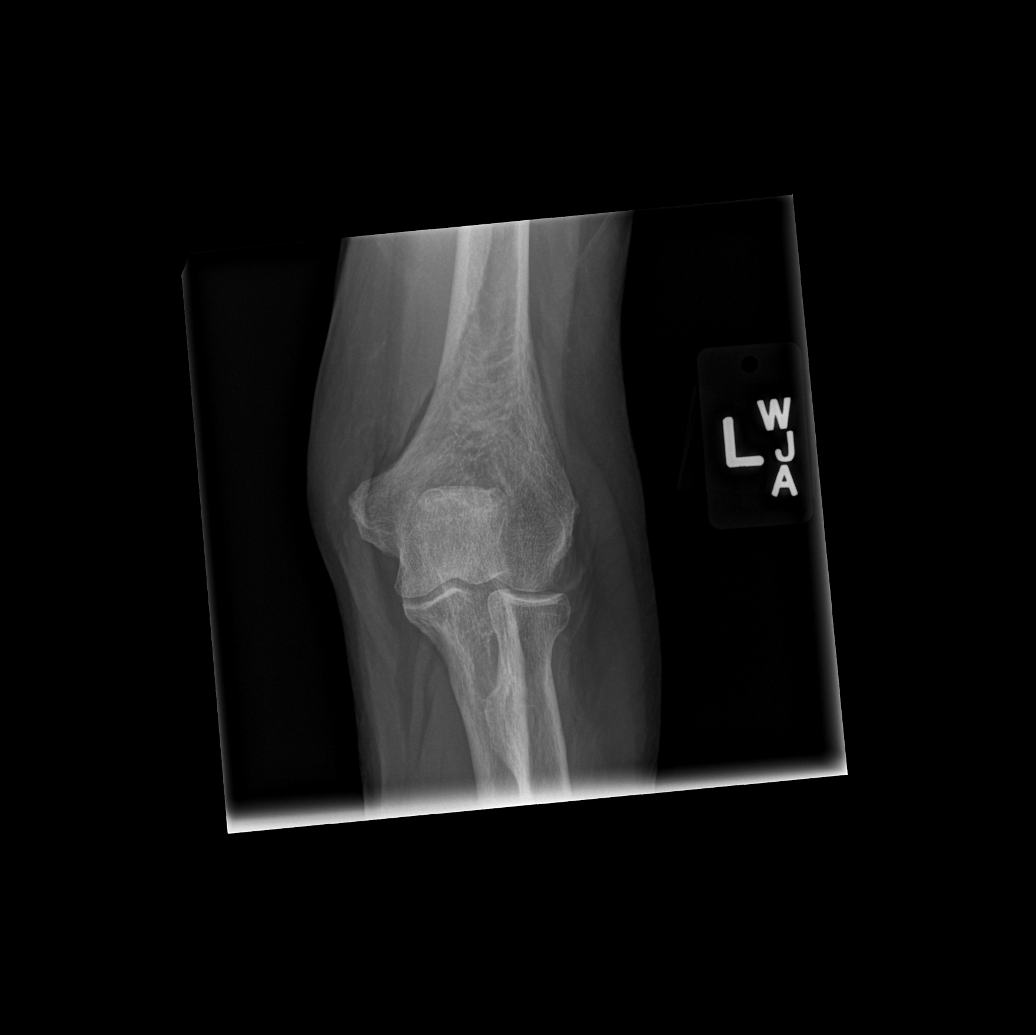

[x elbow obl left (1 of 2)]
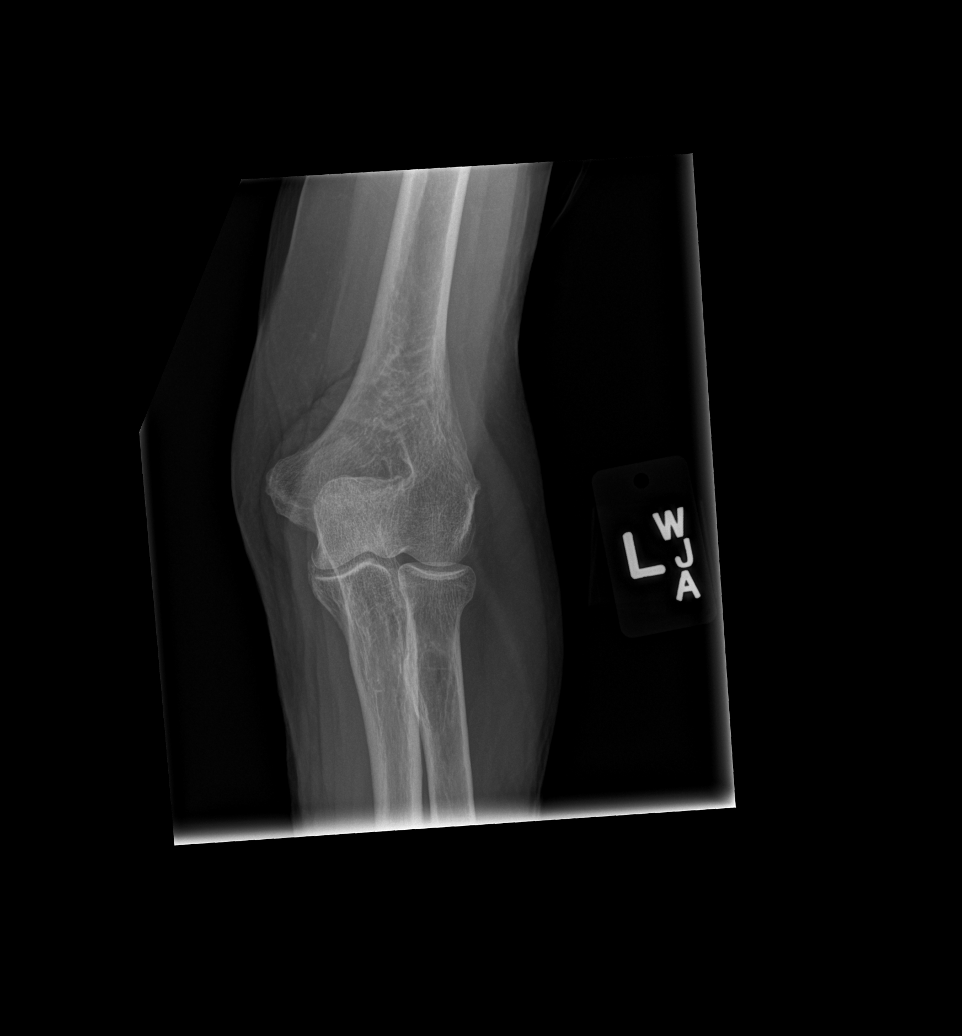

[x elbow obl left (2 of 2)]
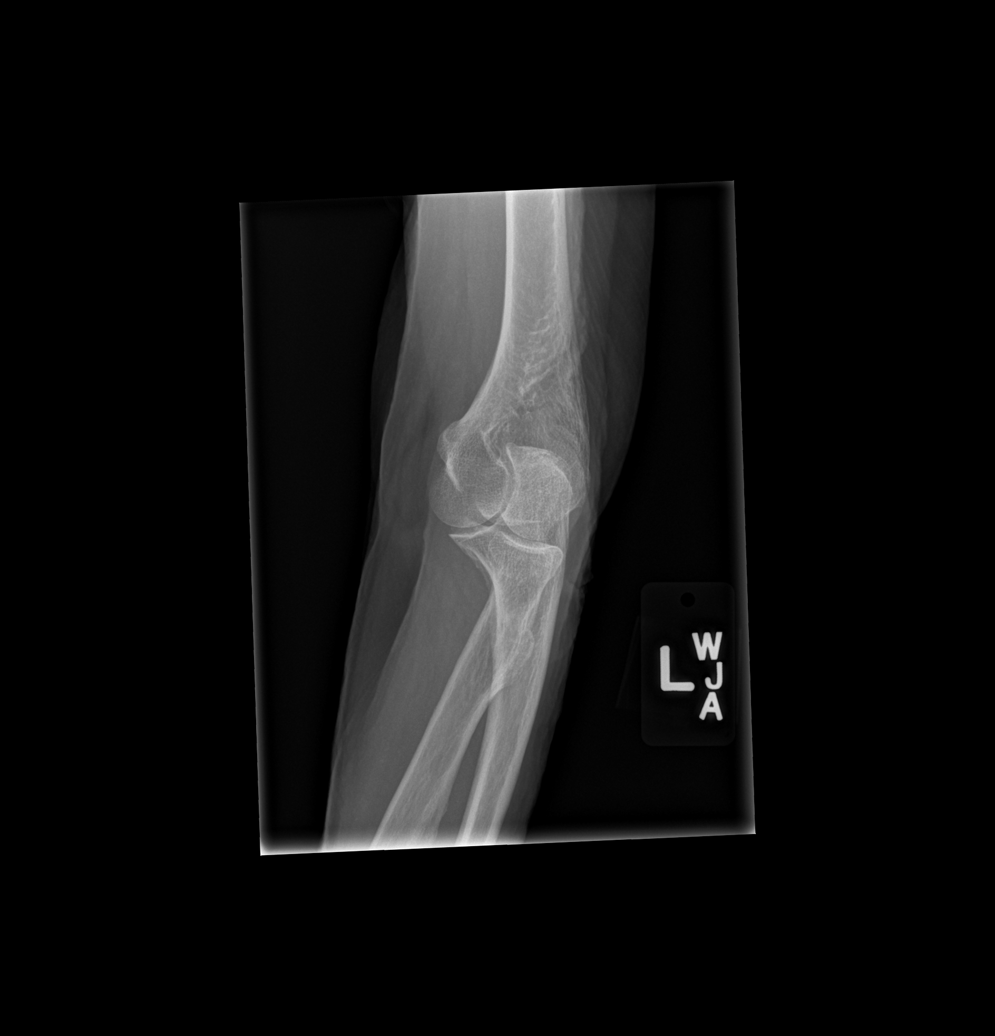

[x elbow lat left]
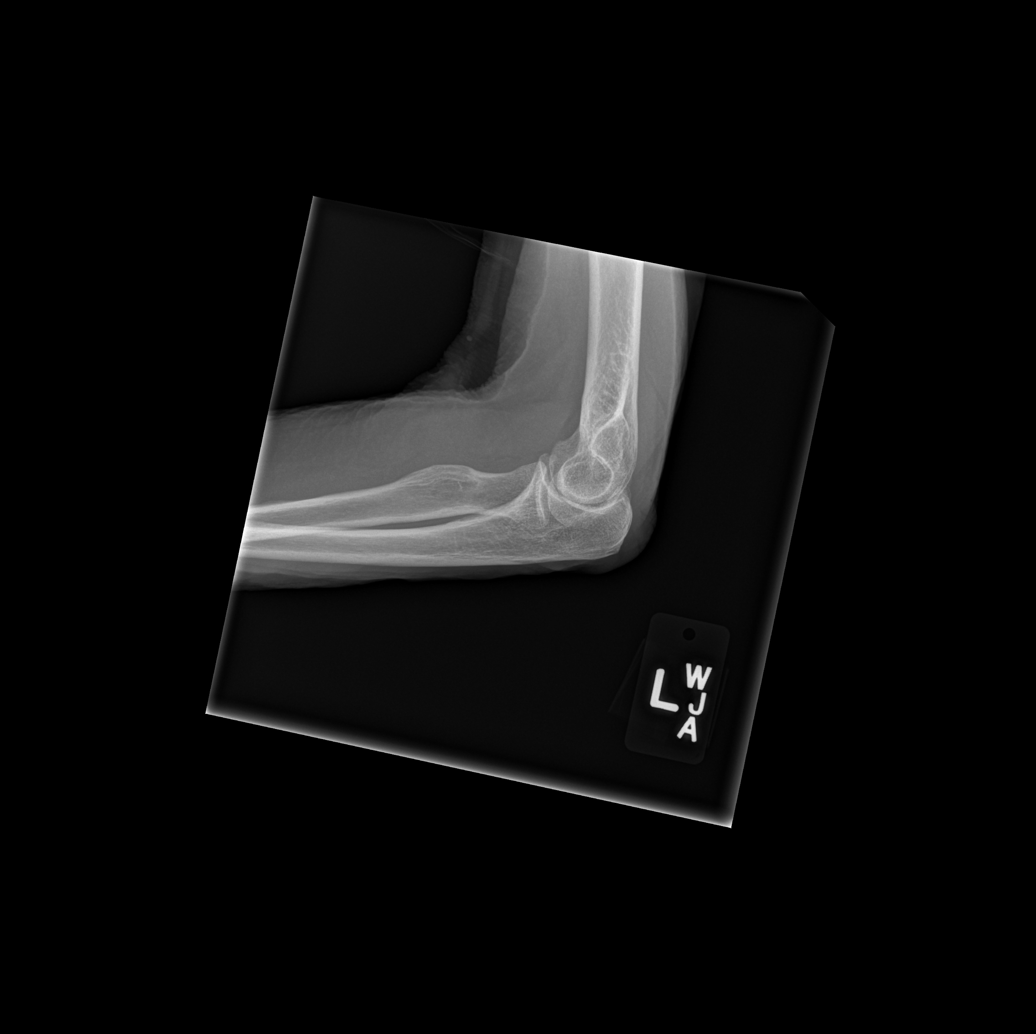

[4 of 4 positions shown; findings below may reference images not displayed]

FINDINGS: There is diffuse decreased bone mineralization. Minimal degenerative
change over the elbow. No definite fracture or dislocation.
IMPRESSION: No acute findings.

## 2016-06-18 ENCOUNTER — Encounter (INDEPENDENT_AMBULATORY_CARE_PROVIDER_SITE_OTHER): Payer: Self-pay

## 2016-06-18 ENCOUNTER — Ambulatory Visit (INDEPENDENT_AMBULATORY_CARE_PROVIDER_SITE_OTHER): Admitting: Nurse Practitioner

## 2016-06-18 ENCOUNTER — Encounter: Payer: Self-pay | Admitting: Nurse Practitioner

## 2016-06-18 VITALS — BP 180/80 | HR 68 | Ht 62.0 in | Wt 120.1 lb

## 2016-06-18 DIAGNOSIS — I480 Paroxysmal atrial fibrillation: Secondary | ICD-10-CM | POA: Diagnosis not present

## 2016-06-18 DIAGNOSIS — I10 Essential (primary) hypertension: Secondary | ICD-10-CM | POA: Diagnosis not present

## 2016-06-18 NOTE — Patient Instructions (Addendum)
We will be checking the following labs today - NONE   Medication Instructions:    Continue with your current medicines.     Testing/Procedures To Be Arranged:  N/A  Follow-Up:   See me in 6 months.    Other Special Instructions:   N/A    If you need a refill on your cardiac medications before your next appointment, please call your pharmacy.   Call the [facility] office at [phone] if you have any questions, problems or concerns.      

## 2016-06-18 NOTE — Progress Notes (Signed)
CARDIOLOGY OFFICE NOTE  Date:  06/18/2016    [name] Date of Birth: [birth year] Medical Record #[id number]  PCP:  Nadean Corwin, MD  Cardiologist:  [facility]    Chief Complaint  Patient presents with  . Hypertension  . Hyperlipidemia    Seen for Dr. Elease Hashimoto    History of Present Illness: [name] is a 83 y.o. female who presents today for a 6 month check. Seen for Dr. Elease Hashimoto. Former patient of Dr. Ronnald Nian.   Has a remote history of syncope with a past loop record implant that turned out to be unremarkable. This was many years ago. Other issues include PAF, first degree AV block, HTN, cerebrovascular disease, OA, hypothyroidism and advanced age.   Was admitted to the hospital in December 2013 with a recurrent syncopal spell. She had been cooking in the kitchen and got weak and went to sit down. She lost consciousness and was out for about 30 to 60 seconds, then regained consciousness. No seizure, HA, weakness, etc. EMS was called. BP was low (60's/40's). She had a sinus pause on EKG of about 2.5 seconds (daughter has brought in strips for review). She had been fairly active and had done 3 loads of laundry that day. Dr. Elease Hashimoto saw her and felt that this was orthostasis/dehydation. Her lasix was stopped. Her beta blocker was stopped. BP is to be more liberal and we are not to achieve aggressive control. She was given some IV fluids. Her discharge summary mentions atrial fib during that time but all of her EKGs that I saw showed sinus rhythm. Dr. Harvie Bridge suggestion was to not be aggressive with her blood pressure control and to use support stockings.   Seen back in October of 2014. Was doing ok for the most part. Had been put back on her low dose beta blocker (atenolol 12.5 mg) due to having headaches and I stopped it - I felt her headaches were more related to arthritis. Called in early January with elevated BP - added back 10 mg of Lasix justif systolic  above 175.   2 prior ER visits back in June of 2015 - BP sky high. Low dose ACE started. Then having some left hand weakness/numbness. Sent back to the ER due to concerns for a stroke - BP ended up running low and I cut the ACE back.  I saw her last April of 2017 - she was pretty out of sorts. Her son had died - she was grieving and taking this very hard. BP up. She was having swelling. Was back on HCTZ and also on Lyrica. At her last visit with me back in July she was doing some better but really starting to go down in a general fashion.   Comes back today. Here with Miranda her daughter. She is now on Hospice/Palliative care. Some issue with a pancreatic cyst but sounds like this has not turned out to be anything. Comfort care has been recommended as well as conservative management. She sleeps a lot. No chest pain. Not checking BP at home. Not on Lasix. No real pain anymore. On very little medicine. She actually seems to be doing ok.   Past Medical History:  Diagnosis Date  . Atrial fibrillation (HCC)    not a candidate for coumadin due to falls  . AV block, 1st degree   . CVD (cerebrovascular disease)    History of CVA  . GERD (gastroesophageal reflux disease)   . History  of echocardiogram 9/11   normal EF, mild to mod MR & TR, mod pulmonary HTN  . Hypertension   . Hypothyroidism   . Osteoarthritis   . Pneumonia 09/11  . Prediabetes   . Syncope and collapse    Negative loop recorder in the past  . Traumatic enucleation of eye    left eye    Past Surgical History:  Procedure Laterality Date  . BACK SURGERY    . INGUINAL HERNIA REPAIR     left sided     Medications: Current Outpatient Prescriptions  Medication Sig Dispense Refill  . acetaminophen (TYLENOL) 325 MG tablet Take 325-650 mg by mouth See admin instructions. 325 mg when Tramadol is taken; 650 mg as needed for headache or pain and NO Tramadol    . aspirin 81 MG tablet Take 81 mg by mouth every evening.     .  beta carotene w/minerals (OCUVITE) tablet Take 1 tablet by mouth 2 (two) times daily.    . carvedilol (COREG) 6.25 MG tablet TAKE 1 TABLET BY MOUTH TWICE A DAY WITH MEALS 180 tablet 1  . Cholecalciferol (VITAMIN D3) 1000 UNITS CAPS Take 1,000 Units by mouth 2 (two) times daily.     . clotrimazole (LOTRIMIN) 1 % cream Apply to affected area 2 times daily 15 g 0  . docusate sodium (COLACE) 100 MG capsule Take 100 mg by mouth 2 (two) times daily as needed for mild constipation.   0  . DULoxetine (CYMBALTA) 60 MG capsule Take 1 capsule (60 mg total) by mouth daily. (Patient taking differently: Take 60 mg by mouth every morning. ) 30 capsule 11  . gabapentin (NEURONTIN) 100 MG capsule Take 1 capsule (100 mg total) by mouth 2 (two) times daily. (Patient taking differently: Take 100 mg by mouth at bedtime. ) 60 capsule 2  . hydrocortisone (ANUSOL-HC) 2.5 % rectal cream Place 1 application rectally 2 (two) times daily. 30 g 0  . lidocaine (LIDODERM) 5 % Place 1 patch onto the skin every 12 (twelve) hours. Remove & Discard patch within 12 hours or as directed by MD 30 patch 5  . LORazepam (ATIVAN) 1 MG tablet Take 1 to 2 tablets every 4 hours PRN for agitation. (Patient taking differently: Take 1-2 mg by mouth every 4 (four) hours as needed for anxiety (AGITATION). ) 90 tablet 0  . MAGNESIUM PO Take 1 tablet by mouth every morning.     . ondansetron (ZOFRAN) 4 MG tablet Take 1 tablet (4 mg total) by mouth every 6 (six) hours. (Patient taking differently: Take 4 mg by mouth every 6 (six) hours as needed for nausea. ) 12 tablet 0  . pantoprazole (PROTONIX) 40 MG tablet Take 1 tablet (40 mg total) by mouth daily. 30 tablet 1  . senna-docusate (SENOKOT-S) 8.6-50 MG tablet Take 1 tablet by mouth at bedtime as needed for mild constipation. 30 tablet 0  . simethicone (MYLICON) 80 MG chewable tablet Chew 1 tablet (80 mg total) by mouth every 4 (four) hours as needed for flatulence (Indigestion). 30 tablet 0  .  SYNTHROID 75 MCG tablet TAKE 1 TABLET DAILY BEFORE BREAKFAST (Patient taking differently: TAKE 1 TABLET (75 mcg) DAILY BEFORE BREAKFAST) 90 tablet 0  . traMADol (ULTRAM) 50 MG tablet TAKE 1 TABLET BY MOUTH 3-4 TIMES PER DAY AS NEEDED FOR PAIN (Patient taking differently: TAKE 50 MG BY MOUTH NIGHTLY. MAY ALSO TAKE AS NEEDED FOR PAIN) 100 tablet 0   No current facility-administered medications for this  visit.     Allergies: Allergies  Allergen Reactions  . Augmentin [Amoxicillin-Pot Clavulanate] Diarrhea  . Ciprocin-Fluocin-Procin [Fluocinolone Acetonide] Nausea And Vomiting  . Ciprofloxacin Hcl Nausea And Vomiting  . Codeine Hives and Nausea And Vomiting  . Sulfa Drugs Cross Reactors Hives and Nausea And Vomiting    Social History: The patient  reports that she has never smoked. She has never used smokeless tobacco. She reports that she does not drink alcohol or use drugs.   Family History: The patient's family history includes Cancer in her brother; Heart attack in her brother and father; Stroke in her brother.   Review of Systems: Please see the history of present illness.   Otherwise, the review of systems is positive for none.   All other systems are reviewed and negative.   Physical Exam: VS:  BP (!) 180/80   Pulse 68   Ht 5\' 2"  (1.575 m)   Wt 120 lb 1.9 oz (54.5 kg)   SpO2 96% Comment: at rest  BMI 21.97 kg/m  .  BMI Body mass index is 21.97 kg/m.  Wt Readings from Last 3 Encounters:  06/18/16 120 lb 1.9 oz (54.5 kg)  06/05/16 115 lb (52.2 kg)  04/23/16 112 lb (50.8 kg)   BP by me is 160/80.   General: Pleasant. Elderly female who is hard of hearing but alert and in no acute distress.  She does fall asleep quite easily during the visit.  HEENT: Normal but with chronic eye deformity.  Neck: Supple, no JVD, carotid bruits, or masses noted.  Cardiac: Regular rate and rhythm. No murmurs, rubs, or gallops. No edema. Legs with brawny stasis changes.  Respiratory:  Lungs  are clear to auscultation bilaterally with normal work of breathing.  GI: Soft and nontender.  MS: No deformity or atrophy. Gait not checked. Skin: Warm and dry. Color is normal.  Neuro:  Strength and sensation are intact and no gross focal deficits noted.  Psych: Alert, appropriate and with normal affect.   LABORATORY DATA:  EKG:  EKG is not ordered today.  Lab Results  Component Value Date   WBC 7.2 06/05/2016   HGB 10.9 (L) 06/05/2016   HCT 34.0 (L) 06/05/2016   PLT 270 06/05/2016   GLUCOSE 103 (H) 06/05/2016   CHOL 197 08/13/2015   TRIG 70 08/13/2015   HDL 87 08/13/2015   LDLCALC 96 08/13/2015   ALT 14 06/05/2016   AST 25 06/05/2016   NA 135 06/05/2016   K 4.3 06/05/2016   CL 98 06/05/2016   CREATININE 0.99 (H) 06/05/2016   BUN 22 06/05/2016   CO2 27 06/05/2016   TSH 6.93 (H) 06/05/2016   INR 1.04 04/17/2016   HGBA1C 5.6 08/13/2015   MICROALBUR 1.1 05/09/2015    BNP (last 3 results)  Recent Labs  02/15/16 1445 02/16/16 0644  BNP 263.6* 339.4*    ProBNP (last 3 results) No results for input(s): PROBNP in the last 8760 hours.   Other Studies Reviewed Today:  Echo Study Conclusions from 04/2012  - Left ventricle: The cavity size was normal. Wall thickness was normal. Systolic function was normal. The estimated ejection fraction was in the range of 60% to 65%. Features are consistent with a pseudonormal left ventricular filling pattern, with concomitant abnormal relaxation and increased filling pressure (grade 2 diastolic dysfunction). - Mitral valve: Mild regurgitation. - Pulmonary arteries: Systolic pressure was moderately increased. PA peak pressure: 54mm Hg (S).  Assessment/Plan:  1. HTN - I have left her  on her current regimen. Recheck by me is fine.   2. Syncope - not recurred.   3. Advanced age   68. PAF - not a candidate for anticoagulation given age and unsteadiness.   5. ? Pancreatic mass - now on Hospice service.     She seems to be holding her own at this time. Will be available as needed.    Current medicines are reviewed with the patient today.  The patient does not have concerns regarding medicines other than what has been noted above.  The following changes have been made:  See above.  Labs/ tests ordered today include:   No orders of the defined types were placed in this encounter.    Disposition:   FU with me in 6 months.   Patient is agreeable to this plan and will call if any problems develop in the interim.   SignedNorma Fredrickson, NP  06/18/2016 2:05 PM  [facility] [address] [place], [place]  [address] Phone: [phone] Fax: [phone]

## 2016-06-20 ENCOUNTER — Other Ambulatory Visit: Payer: Medicare Other

## 2016-06-20 ENCOUNTER — Other Ambulatory Visit: Payer: Self-pay | Admitting: Physician Assistant

## 2016-06-20 DIAGNOSIS — R350 Frequency of micturition: Secondary | ICD-10-CM

## 2016-06-21 LAB — URINALYSIS, ROUTINE W REFLEX MICROSCOPIC
BILIRUBIN URINE: NEGATIVE
GLUCOSE, UA: NEGATIVE
HGB URINE DIPSTICK: NEGATIVE
KETONES UR: NEGATIVE
Nitrite: NEGATIVE
PROTEIN: NEGATIVE
Specific Gravity, Urine: 1.011 (ref 1.001–1.035)
pH: 6.5 (ref 5.0–8.0)

## 2016-06-21 LAB — URINALYSIS, MICROSCOPIC ONLY
BACTERIA UA: NONE SEEN [HPF]
Casts: NONE SEEN [LPF]
Crystals: NONE SEEN [HPF]
YEAST: NONE SEEN [HPF]

## 2016-06-22 LAB — URINE CULTURE: Organism ID, Bacteria: NO GROWTH

## 2016-06-29 ENCOUNTER — Other Ambulatory Visit: Payer: Self-pay | Admitting: Internal Medicine

## 2016-06-30 ENCOUNTER — Telehealth: Payer: Self-pay | Admitting: *Deleted

## 2016-06-30 ENCOUNTER — Other Ambulatory Visit: Payer: Self-pay | Admitting: Internal Medicine

## 2016-06-30 MED ORDER — CEFUROXIME AXETIL 500 MG PO TABS: 500.0000 mg | ORAL_TABLET | Freq: Two times a day (BID) | ORAL | 0 refills | Status: AC

## 2016-06-30 MED ORDER — PREDNISONE 20 MG PO TABS: ORAL_TABLET | ORAL | 0 refills | Status: AC

## 2016-06-30 NOTE — Telephone Encounter (Signed)
Patty with Hospice called.  She reported the patient has URI with wheezing and minimal air movement.  Her O2sat is 89-90.  Darel HongJudy was informed that Dr Oneta RackMcKeown, sent in an RX for Ceftin BID and a Prednisone taper.

## 2016-07-04 DIAGNOSIS — R531 Weakness: Secondary | ICD-10-CM | POA: Diagnosis not present

## 2016-07-14 ENCOUNTER — Other Ambulatory Visit: Payer: Self-pay | Admitting: Internal Medicine

## 2016-07-24 DEATH — deceased

## 2016-12-02 ENCOUNTER — Ambulatory Visit: Payer: Self-pay | Admitting: Nurse Practitioner

## 2016-12-16 ENCOUNTER — Ambulatory Visit: Payer: Self-pay | Admitting: Internal Medicine

## 2017-06-10 ENCOUNTER — Encounter: Payer: Self-pay | Admitting: Physician Assistant
